# Patient Record
Sex: Male | Born: 1975 | Race: Black or African American | Hispanic: No | Marital: Single | Smoking: Never smoker
Health system: Southern US, Community
[De-identification: ages and names within clinical notes are randomized; demographics above are authoritative.]

## PROBLEM LIST (undated history)

## (undated) DIAGNOSIS — J189 Pneumonia, unspecified organism: Secondary | ICD-10-CM

## (undated) DIAGNOSIS — I10 Essential (primary) hypertension: Secondary | ICD-10-CM

## (undated) DIAGNOSIS — G4700 Insomnia, unspecified: Secondary | ICD-10-CM

## (undated) DIAGNOSIS — K219 Gastro-esophageal reflux disease without esophagitis: Secondary | ICD-10-CM

## (undated) DIAGNOSIS — Z789 Other specified health status: Secondary | ICD-10-CM

## (undated) HISTORY — DX: Essential (primary) hypertension: I10

## (undated) HISTORY — PX: FOOT SURGERY: SHX648

## (undated) HISTORY — DX: Gastro-esophageal reflux disease without esophagitis: K21.9

## (undated) HISTORY — DX: Insomnia, unspecified: G47.00

---
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
## 2016-07-28 ENCOUNTER — Emergency Department (HOSPITAL_COMMUNITY)
Admission: EM | Admit: 2016-07-28 | Discharge: 2016-07-28 | Disposition: A | Discharge status: Home / Self Care | Payer: Self-pay | Attending: Emergency Medicine | Admitting: Emergency Medicine

## 2016-07-28 ENCOUNTER — Emergency Department (HOSPITAL_COMMUNITY): Payer: Self-pay

## 2016-07-28 ENCOUNTER — Encounter (HOSPITAL_COMMUNITY): Payer: Self-pay | Admitting: Emergency Medicine

## 2016-07-28 DIAGNOSIS — Y999 Unspecified external cause status: Secondary | ICD-10-CM | POA: Insufficient documentation

## 2016-07-28 DIAGNOSIS — S62646A Nondisplaced fracture of proximal phalanx of right little finger, initial encounter for closed fracture: Secondary | ICD-10-CM | POA: Insufficient documentation

## 2016-07-28 DIAGNOSIS — S62316A Displaced fracture of base of fifth metacarpal bone, right hand, initial encounter for closed fracture: Secondary | ICD-10-CM | POA: Insufficient documentation

## 2016-07-28 DIAGNOSIS — W009XXA Unspecified fall due to ice and snow, initial encounter: Secondary | ICD-10-CM | POA: Insufficient documentation

## 2016-07-28 DIAGNOSIS — F17200 Nicotine dependence, unspecified, uncomplicated: Secondary | ICD-10-CM | POA: Insufficient documentation

## 2016-07-28 DIAGNOSIS — Y9289 Other specified places as the place of occurrence of the external cause: Secondary | ICD-10-CM | POA: Insufficient documentation

## 2016-07-28 DIAGNOSIS — Y9389 Activity, other specified: Secondary | ICD-10-CM | POA: Insufficient documentation

## 2016-07-28 MED ORDER — CEPHALEXIN 250 MG PO CAPS
500.0000 mg | ORAL_CAPSULE | Freq: Once | ORAL | Status: AC
  Administered 2016-07-28: 500 mg via ORAL
  Filled 2016-07-28: qty 2

## 2016-07-28 MED ORDER — HYDROCODONE-ACETAMINOPHEN 5-325 MG PO TABS
1.0000 | ORAL_TABLET | Freq: Once | ORAL | Status: AC
  Administered 2016-07-28: 1 via ORAL
  Filled 2016-07-28: qty 1

## 2016-07-28 MED ORDER — HYDROCODONE-ACETAMINOPHEN 5-325 MG PO TABS: 1.0000 | ORAL_TABLET | ORAL | 0 refills | Status: DC | PRN

## 2016-07-28 NOTE — Progress Notes (Signed)
Orthopedic Tech Progress Note Patient Details:  Keith ChalkCasaun Alvarado 1975-11-18 [id number]  Ortho Devices Type of Ortho Device: Ace wrap, Ulna gutter splint Ortho Device/Splint Location: RUE Ortho Device/Splint Interventions: Ordered, Application   Jennye MoccasinHughes, Keziah Drotar Craig 07/28/2016, 8:44 PM

## 2016-07-28 NOTE — ED Notes (Signed)
Orth tech notified to apply splint.

## 2016-07-28 NOTE — ED Notes (Signed)
Patient transported to X-ray 
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
## 2019-08-20 HISTORY — PX: JOINT REPLACEMENT: SHX530

## 2020-01-17 ENCOUNTER — Other Ambulatory Visit: Payer: Self-pay

## 2020-01-17 ENCOUNTER — Ambulatory Visit (INDEPENDENT_AMBULATORY_CARE_PROVIDER_SITE_OTHER): Payer: Medicaid Other

## 2020-01-17 ENCOUNTER — Ambulatory Visit (HOSPITAL_COMMUNITY)
Admission: EM | Admit: 2020-01-17 | Discharge: 2020-01-17 | Disposition: A | Discharge status: Home / Self Care | Payer: Medicaid Other | Attending: Family Medicine | Admitting: Family Medicine

## 2020-01-17 ENCOUNTER — Encounter (HOSPITAL_COMMUNITY): Payer: Self-pay

## 2020-01-17 DIAGNOSIS — M25551 Pain in right hip: Secondary | ICD-10-CM

## 2020-01-17 DIAGNOSIS — R52 Pain, unspecified: Secondary | ICD-10-CM

## 2020-01-17 MED ORDER — IBUPROFEN 800 MG PO TABS
800.0000 mg | ORAL_TABLET | Freq: Once | ORAL | Status: AC
  Administered 2020-01-17: 800 mg via ORAL

## 2020-01-17 MED ORDER — HYDROCODONE-ACETAMINOPHEN 7.5-325 MG PO TABS: 1.0000 | ORAL_TABLET | Freq: Four times a day (QID) | ORAL | 0 refills | Status: DC | PRN

## 2020-01-17 MED ORDER — METHYLPREDNISOLONE 4 MG PO TBPK: ORAL_TABLET | ORAL | 0 refills | Status: DC

## 2020-01-17 MED ORDER — KETOROLAC TROMETHAMINE 60 MG/2ML IM SOLN
INTRAMUSCULAR | Status: AC
  Filled 2020-01-17: qty 2

## 2020-01-17 MED ORDER — IBUPROFEN 800 MG PO TABS
ORAL_TABLET | ORAL | Status: AC
  Filled 2020-01-17: qty 1

## 2020-01-17 MED ORDER — KETOROLAC TROMETHAMINE 60 MG/2ML IM SOLN: 60.0000 mg | Freq: Once | INTRAMUSCULAR | Status: DC
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
## 2020-01-17 NOTE — Discharge Instructions (Signed)
Take the medrol as directed Take all of day one today Take the pain medicine as needed Activity as tolerated Return if not seeing improvement in a few days May need sports medicine specialist  if fails to improve
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
## 2020-06-27 ENCOUNTER — Other Ambulatory Visit: Payer: Self-pay | Admitting: Orthopedic Surgery

## 2020-07-04 NOTE — Patient Instructions (Addendum)
DUE TO COVID-19 ONLY ONE VISITOR IS ALLOWED TO COME WITH YOU AND STAY IN THE WAITING ROOM ONLY DURING PRE OP AND PROCEDURE DAY OF SURGERY. THE 1 VISITOR  MAY VISIT WITH YOU AFTER SURGERY IN YOUR PRIVATE ROOM DURING VISITING HOURS ONLY!  YOU NEED TO HAVE A COVID 19 TEST ON: 07/13/20 @ 11:30 AM , THIS TEST MUST BE DONE BEFORE SURGERY,  COVID TESTING SITE [address] [place] [address], IT IS ON THE RIGHT GOING OUT [address] APPROXIMATELY  2 MINUTES PAST [employer] ON THE RIGHT. ONCE YOUR COVID TEST IS COMPLETED,  PLEASE BEGIN THE QUARANTINE INSTRUCTIONS AS OUTLINED IN YOUR HANDOUT.                Keith Alvarado    Your procedure is scheduled on: 07/17/20   Report to [facility] Main  Entrance   Report to short stay at: 5:30 AM     Call this number if you have problems the morning of surgery [phone]    Remember:   NO SOLID FOOD AFTER MIDNIGHT THE NIGHT PRIOR TO SURGERY. NOTHING BY MOUTH EXCEPT CLEAR LIQUIDS UNTIL: 4:15 AM . PLEASE FINISH ENSURE DRINK PER SURGEON ORDER  WHICH NEEDS TO BE COMPLETED AT: 4:15 AM .  CLEAR LIQUID DIET   Foods Allowed                                                                     Foods Excluded  Coffee and tea, regular and decaf                             liquids that you cannot  Plain Jell-O any favor except red or purple                                           see through such as: Fruit ices (not with fruit pulp)                                     milk, soups, orange juice  Iced Popsicles                                    All solid food Carbonated beverages, regular and diet                                    Cranberry, grape and apple juices Sports drinks like Gatorade Lightly seasoned clear broth or consume(fat free) Sugar, honey syrup  Sample Menu Breakfast                                Lunch  Supper Cranberry juice                    Beef broth                             Chicken broth Jell-O                                     Grape juice                           Apple juice Coffee or tea                        Jell-O                                      Popsicle                                                Coffee or tea                        Coffee or tea  _____________________________________________________________________  BRUSH YOUR TEETH MORNING OF SURGERY AND RINSE YOUR MOUTH OUT, NO CHEWING GUM CANDY OR MINTS.               You may not have any metal on your body including hair pins and              piercings  Do not wear jewelry, lotions, powders or perfumes, deodorant             Men may shave face and neck.   Do not bring valuables to the hospital. [facility] IS NOT             RESPONSIBLE   FOR VALUABLES.  Contacts, dentures or bridgework may not be worn into surgery.  Leave suitcase in the car. After surgery it may be brought to your room.     Patients discharged the day of surgery will not be allowed to drive home. IF YOU ARE HAVING SURGERY AND GOING HOME THE SAME DAY, YOU MUST HAVE AN ADULT TO DRIVE YOU HOME AND BE WITH YOU FOR 24 HOURS. YOU MAY GO HOME BY TAXI OR UBER OR ORTHERWISE, BUT AN ADULT MUST ACCOMPANY YOU HOME AND STAY WITH YOU FOR 24 HOURS.  Name and phone number of your driver:  Special Instructions: N/A              Please read over the following fact sheets you were given: _____________________________________________________________________        [facility] - Preparing [facility] Before surgery, you can play an important role.  Because skin is not sterile, your skin needs to be as free of germs as possible.  You can reduce the number of germs on your skin by washing with CHG (chlorahexidine gluconate) soap before surgery.  CHG is an antiseptic cleaner which kills germs and bonds with the skin to continue killing germs even after washing. Please DO NOT use if you have an allergy to  CHG or antibacterial soaps.   If your skin becomes reddened/irritated stop using the CHG and inform your nurse when you arrive at Short Stay. Do not shave (including legs and underarms) for at least 48 hours prior to the first CHG shower.  You may shave your face/neck. Please follow these instructions carefully:  1.  Shower with CHG Soap the night before surgery and the  morning of Surgery.  2.  If you choose to wash your hair, wash your hair first as usual with your  normal  shampoo.  3.  After you shampoo, rinse your hair and body thoroughly to remove the  shampoo.                           4.  Use CHG as you would any other liquid soap.  You can apply chg directly  to the skin and wash                       Gently with a scrungie or clean washcloth.  5.  Apply the CHG Soap to your body ONLY FROM THE NECK DOWN.   Do not use on face/ open                           Wound or open sores. Avoid contact with eyes, ears mouth and genitals (private parts).                       Wash face,  Genitals (private parts) with your normal soap.             6.  Wash thoroughly, paying special attention to the area where your surgery  will be performed.  7.  Thoroughly rinse your body with warm water from the neck down.  8.  DO NOT shower/wash with your normal soap after using and rinsing off  the CHG Soap.                9.  Pat yourself dry with a clean towel.            10.  Wear clean pajamas.            11.  Place clean sheets on your bed the night of your first shower and do not  sleep with pets. Day of Surgery : Do not apply any lotions/deodorants the morning of surgery.  Please wear clean clothes to the hospital/surgery center.  FAILURE TO FOLLOW THESE INSTRUCTIONS MAY RESULT IN THE CANCELLATION OF YOUR SURGERY PATIENT SIGNATURE_________________________________  NURSE SIGNATURE__________________________________  ________________________________________________________________________   Keith Alvarado  An incentive  spirometer is a tool that can help keep your lungs clear and active. This tool measures how well you are filling your lungs with each breath. Taking long deep breaths may help reverse or decrease the chance of developing breathing (pulmonary) problems (especially infection) following:  A long period of time when you are unable to move or be active. BEFORE THE PROCEDURE   If the spirometer includes an indicator to show your best effort, your nurse or respiratory therapist will set it to a desired goal.  If possible, sit up straight or lean slightly forward. Try not to slouch.  Hold the incentive spirometer in an upright position. INSTRUCTIONS FOR USE  1. Sit on the edge of your bed if possible, or sit up as far as you  can in bed or on a chair. 2. Hold the incentive spirometer in an upright position. 3. Breathe out normally. 4. Place the mouthpiece in your mouth and seal your lips tightly around it. 5. Breathe in slowly and as deeply as possible, raising the piston or the ball toward the top of the column. 6. Hold your breath for 3-5 seconds or for as long as possible. Allow the piston or ball to fall to the bottom of the column. 7. Remove the mouthpiece from your mouth and breathe out normally. 8. Rest for a few seconds and repeat Steps 1 through 7 at least 10 times every 1-2 hours when you are awake. Take your time and take a few normal breaths between deep breaths. 9. The spirometer may include an indicator to show your best effort. Use the indicator as a goal to work toward during each repetition. 10. After each set of 10 deep breaths, practice coughing to be sure your lungs are clear. If you have an incision (the cut made at the time of surgery), support your incision when coughing by placing a pillow or rolled up towels firmly against it. Once you are able to get out of bed, walk around indoors and cough well. You may stop using the incentive spirometer when instructed by your caregiver.   RISKS AND COMPLICATIONS  Take your time so you do not get dizzy or light-headed.  If you are in pain, you may need to take or ask for pain medication before doing incentive spirometry. It is harder to take a deep breath if you are having pain. AFTER USE  Rest and breathe slowly and easily.  It can be helpful to keep track of a log of your progress. Your caregiver can provide you with a simple table to help with this. If you are using the spirometer at home, follow these instructions: SEEK MEDICAL CARE IF:   You are having difficultly using the spirometer.  You have trouble using the spirometer as often as instructed.  Your pain medication is not giving enough relief while using the spirometer.  You develop fever of 100.5 F (38.1 C) or higher. SEEK IMMEDIATE MEDICAL CARE IF:   You cough up bloody sputum that had not been present before.  You develop fever of 102 F (38.9 C) or greater.  You develop worsening pain at or near the incision site. MAKE SURE YOU:   Understand these instructions.  Will watch your condition.  Will get help right away if you are not doing well or get worse. Document Released: 12/16/2006 Document Revised: 10/28/2011 Document Reviewed: 02/16/2007 [facility] Patient Information 2014 [place], [place].   ________________________________________________________________________

## 2020-07-05 ENCOUNTER — Encounter (HOSPITAL_COMMUNITY): Payer: Self-pay

## 2020-07-05 ENCOUNTER — Encounter (HOSPITAL_COMMUNITY)
Admission: RE | Admit: 2020-07-05 | Discharge: 2020-07-05 | Disposition: A | Discharge status: Home / Self Care | Payer: Medicaid Other | Source: Ambulatory Visit | Attending: Orthopedic Surgery | Admitting: Orthopedic Surgery

## 2020-07-05 ENCOUNTER — Ambulatory Visit (HOSPITAL_COMMUNITY)
Admission: RE | Admit: 2020-07-05 | Discharge: 2020-07-05 | Disposition: A | Discharge status: Home / Self Care | Payer: Medicaid Other | Source: Ambulatory Visit | Attending: Orthopedic Surgery | Admitting: Orthopedic Surgery

## 2020-07-05 ENCOUNTER — Other Ambulatory Visit: Payer: Self-pay

## 2020-07-05 DIAGNOSIS — Z01818 Encounter for other preprocedural examination: Secondary | ICD-10-CM

## 2020-07-05 LAB — URINALYSIS, ROUTINE W REFLEX MICROSCOPIC
Bacteria, UA: NONE SEEN
Bilirubin Urine: NEGATIVE
Glucose, UA: NEGATIVE mg/dL
Hgb urine dipstick: NEGATIVE
Ketones, ur: 5 mg/dL — AB
Nitrite: NEGATIVE
Protein, ur: 30 mg/dL — AB
Specific Gravity, Urine: 1.024 (ref 1.005–1.030)
pH: 5 (ref 5.0–8.0)

## 2020-07-05 LAB — BASIC METABOLIC PANEL
Anion gap: 11 (ref 5–15)
BUN: 6 mg/dL (ref 6–20)
CO2: 28 mmol/L (ref 22–32)
Calcium: 9.8 mg/dL (ref 8.9–10.3)
Chloride: 97 mmol/L — ABNORMAL LOW (ref 98–111)
Creatinine, Ser: 0.75 mg/dL (ref 0.61–1.24)
GFR, Estimated: >60 mL/min (ref >60)
Glucose, Bld: 82 mg/dL (ref 70–99)
Potassium: 4.5 mmol/L (ref 3.5–5.1)
Sodium: 136 mmol/L (ref 135–145)

## 2020-07-05 LAB — PROTIME-INR
INR: 0.9 (ref 0.8–1.2)
Prothrombin Time: 12.1 seconds (ref 11.4–15.2)

## 2020-07-05 LAB — TYPE AND SCREEN
ABO/RH(D): A POS
Antibody Screen: NEGATIVE

## 2020-07-05 LAB — CBC WITH DIFFERENTIAL/PLATELET
Abs Immature Granulocytes: 0.08 10*3/uL — ABNORMAL HIGH (ref 0.00–0.07)
Basophils Absolute: 0.1 10*3/uL (ref 0.0–0.1)
Basophils Relative: 1 %
Eosinophils Absolute: 0.2 10*3/uL (ref 0.0–0.5)
Eosinophils Relative: 2 %
HCT: 44.1 % (ref 39.0–52.0)
Hemoglobin: 14.2 g/dL (ref 13.0–17.0)
Immature Granulocytes: 1 %
Lymphocytes Relative: 23 %
Lymphs Abs: 1.8 10*3/uL (ref 0.7–4.0)
MCH: 30.9 pg (ref 26.0–34.0)
MCHC: 32.2 g/dL (ref 30.0–36.0)
MCV: 96.1 fL (ref 80.0–100.0)
Monocytes Absolute: 1.5 10*3/uL — ABNORMAL HIGH (ref 0.1–1.0)
Monocytes Relative: 19 %
Neutro Abs: 4.3 10*3/uL (ref 1.7–7.7)
Neutrophils Relative %: 54 %
Platelets: 432 10*3/uL — ABNORMAL HIGH (ref 150–400)
RBC: 4.59 MIL/uL (ref 4.22–5.81)
RDW: 13.2 % (ref 11.5–15.5)
WBC: 7.9 10*3/uL (ref 4.0–10.5)
nRBC: 0 % (ref 0.0–0.2)

## 2020-07-05 LAB — SURGICAL PCR SCREEN
MRSA, PCR: NEGATIVE
Staphylococcus aureus: POSITIVE — AB

## 2020-07-05 LAB — APTT: aPTT: 32 seconds (ref 24–36)

## 2020-07-05 NOTE — Progress Notes (Signed)
PCR: Positive: STAPH. UA: Trace leukocytes

## 2020-07-05 NOTE — Progress Notes (Signed)
COVID Vaccine Completed: NO Date COVID Vaccine completed: COVID vaccine manufacturer: Pfizer    Moderna   [employer]   PCP - NO PCP Cardiologist -   Chest x-ray -  EKG -  Stress Test -  ECHO -  Cardiac Cath -  Pacemaker/ICD device last checked:  Sleep Study -  CPAP -   Fasting Blood Sugar -  Checks Blood Sugar _____ times a day  Blood Thinner Instructions: Aspirin Instructions: Last Dose:  Anesthesia review:   Patient denies shortness of breath, fever, cough and chest pain at PAT appointment   Patient verbalized understanding of instructions that were given to them at the PAT appointment. Patient was also instructed that they will need to review over the PAT instructions again at home before surgery. 

## 2020-07-07 DIAGNOSIS — M87059 Idiopathic aseptic necrosis of unspecified femur: Secondary | ICD-10-CM | POA: Diagnosis present

## 2020-07-07 NOTE — H&P (Signed)
TOTAL HIP ADMISSION H&P  Patient is admitted for right total hip arthroplasty.  Subjective:  Chief Complaint: right hip pain  HPI: Keith Alvarado, 44 y.o. 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  Onset of symptoms was abrupt starting 1 years ago with rapidlly worsening course since that time.The patient noted no past surgery on the right hip(s).  Patient currently rates pain in the right hip at 10 out of 10 with activity. Patient has night pain, worsening of pain with activity and weight bearing, trendelenberg gait, pain that interfers with activities of daily living and pain with passive range of motion. Patient has evidence of AVN by imaging studies. This condition presents safety issues increasing the risk of falls. This patient has had avascular necrosis of the hip, acetabular fracture, hip dysplasia.  There is no current active infection.  Patient Active Problem List   Diagnosis Date Noted  . 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 Pneumonia     Past Surgical History:  Procedure Laterality Date  . FOOT SURGERY Left     No current facility-administered medications for this encounter.   Current Outpatient Medications  Medication Sig Dispense Refill Last Dose  . Multiple Vitamin (MULTIVITAMIN WITH MINERALS) TABS tablet Take 1 tablet by mouth daily. Centrum     . OVER THE COUNTER MEDICATION Take 1 capsule by mouth in the morning and at bedtime. [employer]     . traMADol (ULTRAM) 50 MG tablet Take 50 mg by mouth 4 (four) times daily as needed.      . trolamine salicylate (ASPERCREME) 10 % cream Apply 1 application topically 4 (four) times daily as needed for muscle  pain.     Marland Kitchen HYDROcodone-acetaminophen (NORCO) 7.5-325 MG tablet Take 1 tablet by mouth every 6 (six) hours as needed for moderate pain. (Patient not taking: Reported on 06/30/2020) 15 tablet 0 Not Taking at Unknown time  . methylPREDNISolone (MEDROL DOSEPAK) 4 MG TBPK tablet TAD (Patient not taking: Reported on 06/30/2020) 21 tablet 0 Not Taking at Unknown time   No Known Allergies  Social History   Tobacco Use  . Smoking status: Never Smoker  . Smokeless tobacco: Never Used  Substance Use Topics  . Alcohol use: Yes    Alcohol/week: 8.0 standard drinks    Types: 8 Cans of beer per week    Comment: every other day    Family History  Problem Relation Age of Onset  . Healthy Mother   . Healthy Father      Review of Systems  Constitutional: Positive for chills, diaphoresis and fever.  HENT: Negative.   Eyes: Negative.   Respiratory: Negative.   Cardiovascular: Negative.   Gastrointestinal: Negative.   Endocrine: Negative.   Genitourinary: Negative.   Musculoskeletal: Positive for arthralgias and myalgias.  Skin: Negative.   Allergic/Immunologic: Negative.   Neurological: Negative.   Hematological: Negative.   Psychiatric/Behavioral: Positive for sleep disturbance.    Objective:  Physical Exam Constitutional:      Appearance: Normal appearance. He is normal weight.  HENT:     Head: Normocephalic and atraumatic.     Nose: Nose normal.  Eyes:     Pupils: Pupils are equal, round, and reactive to light.  Cardiovascular:     Pulses: Normal pulses.  Pulmonary:     Effort: Pulmonary effort is normal.  Musculoskeletal:     Cervical back: Normal range of motion and neck supple.     Comments: Patient walks with a profound right greater than left-sided limp has a very irritable hip to internal or external rotation neurovascular intact distally full range of motion of the knee and ankle toes are pink and well perfused.  Skin:    General: Skin is warm and dry.  Neurological:      General: No focal deficit present.     Mental Status: He is alert and oriented to person, place, and time. Mental status is at baseline.  Psychiatric:        Mood and Affect: Mood normal.        Behavior: Behavior normal.        Thought Content: Thought content normal.        Judgment: Judgment normal.     Vital signs in last 24 hours:    Labs:   Estimated body mass index is 21.7 kg/m as calculated from the following:   Height as of 07/05/20: 6' (1.829 m).   Weight as of 07/05/20: 72.6 kg.   Imaging Review Plain radiographs demonstrate AP pelvis and frog lateral of the right hip, I reviewed on the can if he PACS system.  X-rays show what appears to be an impaction of the femoral head laterally 1-2 mm only seen on the frog lateral view not the AP view.    Assessment/Plan:  End stage arthritis, right hip(s)  The patient history, physical examination, clinical judgement of the provider and imaging studies are consistent with end stage degenerative joint disease of the right hip(s) and total hip arthroplasty is deemed medically necessary. The treatment options including medical management, injection therapy, arthroscopy and arthroplasty were discussed at length. The risks and benefits of total hip arthroplasty were presented and reviewed. The risks due to aseptic loosening, infection, stiffness, dislocation/subluxation,  thromboembolic complications and other imponderables were discussed.  The patient acknowledged the explanation, agreed to proceed with the plan and consent was signed. Patient is being admitted for inpatient treatment for surgery, pain control, PT, OT, prophylactic antibiotics, VTE prophylaxis, progressive ambulation and ADL's and discharge planning.The patient is planning to be discharged home with home health services    Patient's anticipated LOS is less than 2 midnights, meeting these requirements: - Younger than 49 - Lives within 1 hour of care - Has a  competent adult at home to recover with post-op recover - NO history of  - Chronic pain requiring opiods  - Diabetes  - Coronary Artery Disease  - Heart failure  - Heart attack  - Stroke  - DVT/VTE  - Cardiac arrhythmia  - Respiratory Failure/COPD  - Renal failure  - Anemia  - Advanced Liver disease

## 2020-07-14 ENCOUNTER — Other Ambulatory Visit (HOSPITAL_COMMUNITY)
Admission: RE | Admit: 2020-07-14 | Discharge: 2020-07-14 | Disposition: A | Discharge status: Home / Self Care | Payer: Medicaid Other | Source: Ambulatory Visit | Attending: Orthopedic Surgery | Admitting: Orthopedic Surgery

## 2020-07-14 DIAGNOSIS — Z20822 Contact with and (suspected) exposure to covid-19: Secondary | ICD-10-CM | POA: Diagnosis not present

## 2020-07-14 DIAGNOSIS — Z01812 Encounter for preprocedural laboratory examination: Secondary | ICD-10-CM | POA: Diagnosis present

## 2020-07-14 LAB — SARS CORONAVIRUS 2 (TAT 6-24 HRS): SARS Coronavirus 2: NEGATIVE

## 2020-07-16 MED ORDER — TRANEXAMIC ACID 1000 MG/10ML IV SOLN
2000.0000 mg | INTRAVENOUS | Status: DC
  Filled 2020-07-16: qty 20

## 2020-07-16 MED ORDER — BUPIVACAINE LIPOSOME 1.3 % IJ SUSP
10.0000 mL | Freq: Once | INTRAMUSCULAR | Status: DC
  Filled 2020-07-16: qty 10

## 2020-07-17 ENCOUNTER — Observation Stay (HOSPITAL_COMMUNITY)
Admission: RE | Admit: 2020-07-17 | Discharge: 2020-07-18 | Disposition: A | Discharge status: Home / Self Care | Payer: Medicaid Other | Attending: Orthopedic Surgery | Admitting: Orthopedic Surgery

## 2020-07-17 ENCOUNTER — Ambulatory Visit (HOSPITAL_COMMUNITY): Payer: Medicaid Other | Admitting: Certified Registered"

## 2020-07-17 ENCOUNTER — Encounter (HOSPITAL_COMMUNITY)
Admission: RE | Disposition: A | Discharge status: Home / Self Care | Payer: Self-pay | Source: Home / Self Care | Attending: Orthopedic Surgery

## 2020-07-17 ENCOUNTER — Other Ambulatory Visit: Payer: Self-pay

## 2020-07-17 ENCOUNTER — Ambulatory Visit (HOSPITAL_COMMUNITY): Payer: Medicaid Other

## 2020-07-17 ENCOUNTER — Encounter (HOSPITAL_COMMUNITY): Payer: Self-pay | Admitting: Orthopedic Surgery

## 2020-07-17 DIAGNOSIS — M87051 Idiopathic aseptic necrosis of right femur: Secondary | ICD-10-CM | POA: Insufficient documentation

## 2020-07-17 DIAGNOSIS — Z419 Encounter for procedure for purposes other than remedying health state, unspecified: Secondary | ICD-10-CM

## 2020-07-17 DIAGNOSIS — M1611 Unilateral primary osteoarthritis, right hip: Principal | ICD-10-CM | POA: Insufficient documentation

## 2020-07-17 DIAGNOSIS — M87059 Idiopathic aseptic necrosis of unspecified femur: Secondary | ICD-10-CM | POA: Diagnosis present

## 2020-07-17 DIAGNOSIS — M25551 Pain in right hip: Secondary | ICD-10-CM | POA: Diagnosis present

## 2020-07-17 HISTORY — PX: TOTAL HIP ARTHROPLASTY: SHX124

## 2020-07-17 LAB — ABO/RH: ABO/RH(D): A POS

## 2020-07-17 SURGERY — ARTHROPLASTY, HIP, TOTAL, ANTERIOR APPROACH
Anesthesia: Spinal | Site: Hip | Laterality: Right

## 2020-07-17 MED ORDER — LIDOCAINE 2% (20 MG/ML) 5 ML SYRINGE
INTRAMUSCULAR | Status: DC | PRN
  Administered 2020-07-17: 60 mg via INTRAVENOUS
  Administered 2020-07-17: 40 mg via INTRAVENOUS

## 2020-07-17 MED ORDER — DEXMEDETOMIDINE (PRECEDEX) IN NS 20 MCG/5ML (4 MCG/ML) IV SYRINGE
PREFILLED_SYRINGE | INTRAVENOUS | Status: AC
  Filled 2020-07-17: qty 10

## 2020-07-17 MED ORDER — ORAL CARE MOUTH RINSE: 15.0000 mL | Freq: Once | OROMUCOSAL | Status: AC

## 2020-07-17 MED ORDER — ONDANSETRON HCL 4 MG/2ML IJ SOLN
INTRAMUSCULAR | Status: AC
  Filled 2020-07-17: qty 2

## 2020-07-17 MED ORDER — BUPIVACAINE-EPINEPHRINE (PF) 0.25% -1:200000 IJ SOLN
INTRAMUSCULAR | Status: AC
  Filled 2020-07-17: qty 30

## 2020-07-17 MED ORDER — OXYCODONE-ACETAMINOPHEN 5-325 MG PO TABS
1.0000 | ORAL_TABLET | ORAL | 0 refills | Status: DC | PRN
Start: 2020-07-17 — End: 2020-12-12

## 2020-07-17 MED ORDER — PROPOFOL 500 MG/50ML IV EMUL
INTRAVENOUS | Status: DC | PRN
  Administered 2020-07-17: 100 ug/kg/min via INTRAVENOUS

## 2020-07-17 MED ORDER — LACTATED RINGERS IV BOLUS
250.0000 mL | Freq: Once | INTRAVENOUS | Status: AC
  Administered 2020-07-17: 250 mL via INTRAVENOUS

## 2020-07-17 MED ORDER — METHOCARBAMOL 500 MG IVPB - SIMPLE MED
500.0000 mg | Freq: Four times a day (QID) | INTRAVENOUS | Status: DC | PRN
  Filled 2020-07-17: qty 50

## 2020-07-17 MED ORDER — DOCUSATE SODIUM 100 MG PO CAPS
100.0000 mg | ORAL_CAPSULE | Freq: Two times a day (BID) | ORAL | Status: DC
  Administered 2020-07-17 – 2020-07-18 (×2): 100 mg via ORAL
  Filled 2020-07-17 (×2): qty 1

## 2020-07-17 MED ORDER — ALUM & MAG HYDROXIDE-SIMETH 200-200-20 MG/5ML PO SUSP: 30.0000 mL | ORAL | Status: DC | PRN

## 2020-07-17 MED ORDER — PHENOL 1.4 % MT LIQD: 1.0000 | OROMUCOSAL | Status: DC | PRN

## 2020-07-17 MED ORDER — DEXMEDETOMIDINE (PRECEDEX) IN NS 20 MCG/5ML (4 MCG/ML) IV SYRINGE
PREFILLED_SYRINGE | INTRAVENOUS | Status: DC | PRN
  Administered 2020-07-17: 5 ug via INTRAVENOUS
  Administered 2020-07-17: 8 ug via INTRAVENOUS
  Administered 2020-07-17: 4 ug via INTRAVENOUS
  Administered 2020-07-17 (×2): 8 ug via INTRAVENOUS

## 2020-07-17 MED ORDER — MIDAZOLAM HCL 2 MG/2ML IJ SOLN
INTRAMUSCULAR | Status: AC
  Filled 2020-07-17: qty 2

## 2020-07-17 MED ORDER — FLEET ENEMA 7-19 GM/118ML RE ENEM: 1.0000 | ENEMA | Freq: Once | RECTAL | Status: DC | PRN

## 2020-07-17 MED ORDER — KCL IN DEXTROSE-NACL 20-5-0.45 MEQ/L-%-% IV SOLN
INTRAVENOUS | Status: DC
  Filled 2020-07-17 (×3): qty 1000

## 2020-07-17 MED ORDER — ACETAMINOPHEN 500 MG PO TABS
1000.0000 mg | ORAL_TABLET | Freq: Four times a day (QID) | ORAL | Status: DC
  Administered 2020-07-17 – 2020-07-18 (×2): 1000 mg via ORAL
  Filled 2020-07-17 (×3): qty 2

## 2020-07-17 MED ORDER — LIDOCAINE HCL (PF) 2 % IJ SOLN
INTRAMUSCULAR | Status: AC
  Filled 2020-07-17: qty 10

## 2020-07-17 MED ORDER — DEXAMETHASONE SODIUM PHOSPHATE 10 MG/ML IJ SOLN
INTRAMUSCULAR | Status: AC
  Filled 2020-07-17: qty 1

## 2020-07-17 MED ORDER — METOCLOPRAMIDE HCL 5 MG/ML IJ SOLN: 5.0000 mg | Freq: Three times a day (TID) | INTRAMUSCULAR | Status: DC | PRN

## 2020-07-17 MED ORDER — PANTOPRAZOLE SODIUM 40 MG PO TBEC
40.0000 mg | DELAYED_RELEASE_TABLET | Freq: Every day | ORAL | Status: DC
  Administered 2020-07-17 – 2020-07-18 (×2): 40 mg via ORAL
  Filled 2020-07-17 (×2): qty 1

## 2020-07-17 MED ORDER — PHENYLEPHRINE HCL (PRESSORS) 10 MG/ML IV SOLN
INTRAVENOUS | Status: AC
  Filled 2020-07-17: qty 1

## 2020-07-17 MED ORDER — LACTATED RINGERS IV SOLN: INTRAVENOUS | Status: DC

## 2020-07-17 MED ORDER — CEFAZOLIN SODIUM-DEXTROSE 2-4 GM/100ML-% IV SOLN
2.0000 g | INTRAVENOUS | Status: AC
  Administered 2020-07-17: 2 g via INTRAVENOUS
  Filled 2020-07-17: qty 100

## 2020-07-17 MED ORDER — CHLORHEXIDINE GLUCONATE 0.12 % MT SOLN
15.0000 mL | Freq: Once | OROMUCOSAL | Status: AC
  Administered 2020-07-17: 15 mL via OROMUCOSAL

## 2020-07-17 MED ORDER — FENTANYL CITRATE (PF) 250 MCG/5ML IJ SOLN
INTRAMUSCULAR | Status: DC | PRN
  Administered 2020-07-17 (×2): 50 ug via INTRAVENOUS
  Administered 2020-07-17: 100 ug via INTRAVENOUS
  Administered 2020-07-17 (×2): 25 ug via INTRAVENOUS

## 2020-07-17 MED ORDER — DEXAMETHASONE SODIUM PHOSPHATE 10 MG/ML IJ SOLN
10.0000 mg | Freq: Once | INTRAMUSCULAR | Status: AC
  Administered 2020-07-18: 10 mg via INTRAVENOUS
  Filled 2020-07-17: qty 1

## 2020-07-17 MED ORDER — PROPOFOL 10 MG/ML IV BOLUS
INTRAVENOUS | Status: AC
  Filled 2020-07-17: qty 20

## 2020-07-17 MED ORDER — OXYCODONE HCL 5 MG PO TABS
10.0000 mg | ORAL_TABLET | ORAL | Status: DC | PRN
  Administered 2020-07-17 (×2): 10 mg via ORAL
  Administered 2020-07-18 (×3): 15 mg via ORAL
  Filled 2020-07-17 (×3): qty 3
  Filled 2020-07-17: qty 2

## 2020-07-17 MED ORDER — BUPIVACAINE-EPINEPHRINE 0.25% -1:200000 IJ SOLN
INTRAMUSCULAR | Status: DC | PRN
  Administered 2020-07-17: 30 mL

## 2020-07-17 MED ORDER — ONDANSETRON HCL 4 MG/2ML IJ SOLN
INTRAMUSCULAR | Status: DC | PRN
  Administered 2020-07-17: 4 mg via INTRAVENOUS

## 2020-07-17 MED ORDER — SUCCINYLCHOLINE CHLORIDE 200 MG/10ML IV SOSY
PREFILLED_SYRINGE | INTRAVENOUS | Status: AC
  Filled 2020-07-17: qty 10

## 2020-07-17 MED ORDER — TRANEXAMIC ACID-NACL 1000-0.7 MG/100ML-% IV SOLN
1000.0000 mg | INTRAVENOUS | Status: AC
  Administered 2020-07-17: 1000 mg via INTRAVENOUS
  Filled 2020-07-17: qty 100

## 2020-07-17 MED ORDER — ONDANSETRON HCL 4 MG PO TABS: 4.0000 mg | ORAL_TABLET | Freq: Four times a day (QID) | ORAL | Status: DC | PRN

## 2020-07-17 MED ORDER — METOCLOPRAMIDE HCL 5 MG PO TABS: 5.0000 mg | ORAL_TABLET | Freq: Three times a day (TID) | ORAL | Status: DC | PRN

## 2020-07-17 MED ORDER — EPHEDRINE 5 MG/ML INJ
INTRAVENOUS | Status: AC
  Filled 2020-07-17: qty 10

## 2020-07-17 MED ORDER — DIPHENHYDRAMINE HCL 12.5 MG/5ML PO ELIX: 12.5000 mg | ORAL_SOLUTION | ORAL | Status: DC | PRN

## 2020-07-17 MED ORDER — BUPIVACAINE IN DEXTROSE 0.75-8.25 % IT SOLN
INTRATHECAL | Status: DC | PRN
  Administered 2020-07-17: 1.8 mL via INTRATHECAL

## 2020-07-17 MED ORDER — 0.9 % SODIUM CHLORIDE (POUR BTL) OPTIME
TOPICAL | Status: DC | PRN
  Administered 2020-07-17: 1000 mL

## 2020-07-17 MED ORDER — SODIUM CHLORIDE (PF) 0.9 % IJ SOLN
INTRAMUSCULAR | Status: AC
  Filled 2020-07-17: qty 50

## 2020-07-17 MED ORDER — FENTANYL CITRATE (PF) 250 MCG/5ML IJ SOLN
INTRAMUSCULAR | Status: AC
  Filled 2020-07-17: qty 5

## 2020-07-17 MED ORDER — LACTATED RINGERS IV BOLUS
500.0000 mL | Freq: Once | INTRAVENOUS | Status: AC
  Administered 2020-07-17: 500 mL via INTRAVENOUS

## 2020-07-17 MED ORDER — TRANEXAMIC ACID 1000 MG/10ML IV SOLN
INTRAVENOUS | Status: DC | PRN
  Administered 2020-07-17: 2000 mg via TOPICAL

## 2020-07-17 MED ORDER — MENTHOL 3 MG MT LOZG: 1.0000 | LOZENGE | OROMUCOSAL | Status: DC | PRN

## 2020-07-17 MED ORDER — ACETAMINOPHEN 325 MG PO TABS: 325.0000 mg | ORAL_TABLET | Freq: Four times a day (QID) | ORAL | Status: DC | PRN

## 2020-07-17 MED ORDER — POVIDONE-IODINE 10 % EX SWAB
2.0000 "application " | Freq: Once | CUTANEOUS | Status: AC
  Administered 2020-07-17: 2 via TOPICAL

## 2020-07-17 MED ORDER — SODIUM CHLORIDE 0.9% FLUSH
INTRAVENOUS | Status: DC | PRN
  Administered 2020-07-17: 50 mL

## 2020-07-17 MED ORDER — METHOCARBAMOL 500 MG IVPB - SIMPLE MED
INTRAVENOUS | Status: AC
  Administered 2020-07-17: 500 mg via INTRAVENOUS
  Filled 2020-07-17: qty 50

## 2020-07-17 MED ORDER — PROPOFOL 10 MG/ML IV BOLUS
INTRAVENOUS | Status: DC | PRN
  Administered 2020-07-17: 20 mg via INTRAVENOUS
  Administered 2020-07-17 (×2): 50 mg via INTRAVENOUS
  Administered 2020-07-17 (×2): 20 mg via INTRAVENOUS
  Administered 2020-07-17: 50 mg via INTRAVENOUS

## 2020-07-17 MED ORDER — BUPIVACAINE LIPOSOME 1.3 % IJ SUSP
INTRAMUSCULAR | Status: DC | PRN
  Administered 2020-07-17: 10 mL

## 2020-07-17 MED ORDER — ASPIRIN EC 81 MG PO TBEC: 81.0000 mg | DELAYED_RELEASE_TABLET | Freq: Two times a day (BID) | ORAL | 0 refills | Status: DC

## 2020-07-17 MED ORDER — HYDROMORPHONE HCL 1 MG/ML IJ SOLN
INTRAMUSCULAR | Status: AC
  Administered 2020-07-17: 0.5 mg via INTRAVENOUS
  Filled 2020-07-17: qty 1

## 2020-07-17 MED ORDER — STERILE WATER FOR IRRIGATION IR SOLN
Status: DC | PRN
  Administered 2020-07-17: 2000 mL

## 2020-07-17 MED ORDER — BISACODYL 5 MG PO TBEC: 5.0000 mg | DELAYED_RELEASE_TABLET | Freq: Every day | ORAL | Status: DC | PRN

## 2020-07-17 MED ORDER — METHOCARBAMOL 500 MG PO TABS
500.0000 mg | ORAL_TABLET | Freq: Four times a day (QID) | ORAL | Status: DC | PRN
  Administered 2020-07-17 – 2020-07-18 (×3): 500 mg via ORAL
  Filled 2020-07-17 (×3): qty 1

## 2020-07-17 MED ORDER — PROPOFOL 1000 MG/100ML IV EMUL
INTRAVENOUS | Status: AC
  Filled 2020-07-17: qty 100

## 2020-07-17 MED ORDER — ROCURONIUM BROMIDE 10 MG/ML (PF) SYRINGE
PREFILLED_SYRINGE | INTRAVENOUS | Status: AC
  Filled 2020-07-17: qty 10

## 2020-07-17 MED ORDER — TIZANIDINE HCL 2 MG PO TABS: 2.0000 mg | ORAL_TABLET | Freq: Four times a day (QID) | ORAL | 0 refills | Status: DC | PRN

## 2020-07-17 MED ORDER — POLYETHYLENE GLYCOL 3350 17 G PO PACK: 17.0000 g | PACK | Freq: Every day | ORAL | Status: DC | PRN

## 2020-07-17 MED ORDER — GABAPENTIN 300 MG PO CAPS
300.0000 mg | ORAL_CAPSULE | Freq: Three times a day (TID) | ORAL | Status: DC
  Administered 2020-07-17 – 2020-07-18 (×2): 300 mg via ORAL
  Filled 2020-07-17 (×2): qty 1

## 2020-07-17 MED ORDER — HYDROMORPHONE HCL 1 MG/ML IJ SOLN: 0.2500 mg | INTRAMUSCULAR | Status: DC | PRN

## 2020-07-17 MED ORDER — OXYCODONE HCL 5 MG PO TABS
5.0000 mg | ORAL_TABLET | ORAL | Status: DC | PRN
  Administered 2020-07-17: 10 mg via ORAL
  Filled 2020-07-17: qty 2

## 2020-07-17 MED ORDER — ASPIRIN 81 MG PO CHEW
81.0000 mg | CHEWABLE_TABLET | Freq: Two times a day (BID) | ORAL | Status: DC
  Administered 2020-07-17 – 2020-07-18 (×2): 81 mg via ORAL
  Filled 2020-07-17 (×2): qty 1

## 2020-07-17 MED ORDER — MIDAZOLAM HCL 2 MG/2ML IJ SOLN
INTRAMUSCULAR | Status: DC | PRN
  Administered 2020-07-17: 2 mg via INTRAVENOUS

## 2020-07-17 MED ORDER — HYDROMORPHONE HCL 1 MG/ML IJ SOLN
0.5000 mg | INTRAMUSCULAR | Status: DC | PRN
  Administered 2020-07-18: 0.5 mg via INTRAVENOUS
  Filled 2020-07-17: qty 1

## 2020-07-17 MED ORDER — LORAZEPAM 0.5 MG PO TABS: 0.5000 mg | ORAL_TABLET | ORAL | Status: DC | PRN

## 2020-07-17 MED ORDER — PHENYLEPHRINE 40 MCG/ML (10ML) SYRINGE FOR IV PUSH (FOR BLOOD PRESSURE SUPPORT)
PREFILLED_SYRINGE | INTRAVENOUS | Status: AC
  Filled 2020-07-17: qty 10

## 2020-07-17 MED ORDER — ONDANSETRON HCL 4 MG/2ML IJ SOLN: 4.0000 mg | Freq: Four times a day (QID) | INTRAMUSCULAR | Status: DC | PRN

## 2020-07-17 MED ORDER — DEXAMETHASONE SODIUM PHOSPHATE 10 MG/ML IJ SOLN
INTRAMUSCULAR | Status: DC | PRN
  Administered 2020-07-17: 10 mg via INTRAVENOUS

## 2020-07-17 MED ORDER — OXYCODONE HCL 5 MG PO TABS
ORAL_TABLET | ORAL | Status: AC
  Filled 2020-07-17: qty 2

## 2020-07-17 SURGICAL SUPPLY — 48 items
BAG DECANTER FOR FLEXI CONT (MISCELLANEOUS) ×3 IMPLANT
BLADE SAW SGTL 18X1.27X75 (BLADE) ×2 IMPLANT
BLADE SAW SGTL 18X1.27X75MM (BLADE) ×1
BLADE SURG SZ10 CARB STEEL (BLADE) ×6 IMPLANT
CNTNR URN SCR LID CUP LEK RST (MISCELLANEOUS) ×1 IMPLANT
CONT SPEC 4OZ STRL OR WHT (MISCELLANEOUS) ×2
COVER PERINEAL POST (MISCELLANEOUS) ×3 IMPLANT
COVER SURGICAL LIGHT HANDLE (MISCELLANEOUS) ×3 IMPLANT
COVER WAND RF STERILE (DRAPES) ×3 IMPLANT
CUP ACETBLR 52 OD 100 SERIES (Hips) ×3 IMPLANT
DECANTER SPIKE VIAL GLASS SM (MISCELLANEOUS) ×6 IMPLANT
DRAPE ORTHO SPLIT 77X108 STRL (DRAPES)
DRAPE STERI IOBAN 125X83 (DRAPES) ×3 IMPLANT
DRAPE SURG ORHT 6 SPLT 77X108 (DRAPES) IMPLANT
DRAPE U-SHAPE 47X51 STRL (DRAPES) ×6 IMPLANT
DRSG AQUACEL AG ADV 3.5X10 (GAUZE/BANDAGES/DRESSINGS) ×3 IMPLANT
DURAPREP 26ML APPLICATOR (WOUND CARE) ×3 IMPLANT
ELECT BLADE TIP CTD 4 INCH (ELECTRODE) ×3 IMPLANT
ELECT REM PT RETURN 15FT ADLT (MISCELLANEOUS) ×3 IMPLANT
ELIMINATOR HOLE APEX DEPUY (Hips) ×3 IMPLANT
GLOVE BIO SURGEON STRL SZ7.5 (GLOVE) ×3 IMPLANT
GLOVE BIO SURGEON STRL SZ8.5 (GLOVE) ×3 IMPLANT
GLOVE BIOGEL PI IND STRL 8 (GLOVE) ×1 IMPLANT
GLOVE BIOGEL PI IND STRL 9 (GLOVE) ×1 IMPLANT
GLOVE BIOGEL PI INDICATOR 8 (GLOVE) ×2
GLOVE BIOGEL PI INDICATOR 9 (GLOVE) ×2
GOWN STRL REUS W/TWL XL LVL3 (GOWN DISPOSABLE) ×6 IMPLANT
HEAD CERAMIC DELTA 36 PLUS 1.5 (Hips) ×3 IMPLANT
HOLDER FOLEY CATH W/STRAP (MISCELLANEOUS) ×3 IMPLANT
KIT TURNOVER KIT A (KITS) IMPLANT
LINER NEUTRAL 52X36MM PLUS 4 (Liner) ×3 IMPLANT
MANIFOLD NEPTUNE II (INSTRUMENTS) ×3 IMPLANT
NEEDLE HYPO 21X1.5 SAFETY (NEEDLE) ×6 IMPLANT
NS IRRIG 1000ML POUR BTL (IV SOLUTION) ×3 IMPLANT
PACK ANTERIOR HIP CUSTOM (KITS) ×3 IMPLANT
PENCIL SMOKE EVACUATOR (MISCELLANEOUS) IMPLANT
STEM FEMORAL SZ9 HIGH ACTIS (Stem) ×3 IMPLANT
SUT ETHIBOND NAB CT1 #1 30IN (SUTURE) ×3 IMPLANT
SUT VIC AB 0 CT1 27 (SUTURE)
SUT VIC AB 0 CT1 27XBRD ANBCTR (SUTURE) IMPLANT
SUT VIC AB 1 CTX 36 (SUTURE) ×2
SUT VIC AB 1 CTX36XBRD ANBCTR (SUTURE) ×1 IMPLANT
SUT VIC AB 2-0 CT1 27 (SUTURE)
SUT VIC AB 2-0 CT1 TAPERPNT 27 (SUTURE) IMPLANT
SUT VIC AB 3-0 CT1 27 (SUTURE) ×2
SUT VIC AB 3-0 CT1 TAPERPNT 27 (SUTURE) ×1 IMPLANT
SYR CONTROL 10ML LL (SYRINGE) ×9 IMPLANT
TRAY FOLEY MTR SLVR 16FR STAT (SET/KITS/TRAYS/PACK) IMPLANT

## 2020-07-17 NOTE — Interval H&P Note (Signed)
History and Physical Interval Note:  07/17/2020 7:00 AM  Keith Alvarado  has presented today for surgery, with the diagnosis of RIGHT HIP AVASCULAR NECROSIS.  The various methods of treatment have been discussed with the patient and family. After consideration of risks, benefits and other options for treatment, the patient has consented to  Procedure(s): RIGHT TOTAL HIP ARTHROPLASTY ANTERIOR APPROACH (Right) as a surgical intervention.  The patient's history has been reviewed, patient examined, no change in status, stable for surgery.  I have reviewed the patient's chart and labs.  Questions were answered to the patient's satisfaction.     Nestor Lewandowsky

## 2020-07-17 NOTE — Op Note (Signed)
PATIENT ID:      Keith Alvarado  MRN:     [id number] DOB/AGE:    January 20, 1976 / 44 y.o.  OPERATIVE REPORT   DATE OF PROCEDURE:  07/17/2020      PREOPERATIVE DIAGNOSIS:  RIGHT HIP AVASCULAR NECROSIS                                                         POSTOPERATIVE DIAGNOSIS:  Same                                                         PROCEDURE: Anterior R total hip arthroplasty using a 25 mm DePuy Pinnacle  Cup, [employer], 0-degree polyethylene liner, a +1 mm x 46mm ceramic head, a 9 hi Depuy Actis stem  SURGEON: Nestor Lewandowsky  ASSISTANT:   Tomi Likens. [employer]  (present throughout entire procedure and necessary for timely completion of the procedure)   ANESTHESIA: Spinal, Exparel 133mg  injection BLOOD LOSS: 400 cc FLUID REPLACEMENT: 1700 cc crystalloid TRANEXAMIC ACID: 1gm IV, 2gm Topical COMPLICATIONS: none    INDICATIONS FOR PROCEDURE: A 44 y.o. year-old With  RIGHT HIP AVASCULAR NECROSIS   for 3 years, x-rays show bone-on-bone arthritic changes, and osteophytes. Despite conservative measures with observation, anti-inflammatory medicine, narcotics, use of a cane, has severe unremitting pain and can ambulate only a few blocks before resting. Patient desires elective R total hip arthroplasty to decrease pain and increase function. The risks, benefits, and alternatives were discussed at length including but not limited to the risks of infection, bleeding, nerve injury, stiffness, blood clots, the need for revision surgery, cardiopulmonary complications, among others, and they were willing to proceed. Questions answered      PROCEDURE IN DETAIL: The patient was identified by armband,   received preoperative IV antibiotics in the holding area at [facility], taken to the operating room , appropriate anesthetic monitors   were attached and anesthesia was induced with the patient on the gurney. HANA boots were applied to the feet, and the patient  was transferred to  the HANA table with a peroneal post and support underneath the non-operative leg. Theoperative lower extremity was then prepped and draped in the usual sterile fashion from just above the iliac crest to the knee. And a timeout procedure was performed. [facility]. [name], critical for assistance with, positioning, exposure, retraction, instrumentation, and closure.Skin along incision area was injected with 10 cc of Exparel solution. We then made a 14 cm incision along the interval at the leading edge of the tensor fascia lata of starting at 2 cm lateral to the ASIS. Small bleeders in the skin and subcutaneous tissue identified and cauterized we dissected down to the fascia and made an incision in the fascia allowing [facility] to elevate the fascia of the tensor muscle and exploited the interval between the rectus and the tensor fascia lata. A Cobra retractor was then placed along the superior neck of the femur. A cerebellar retractor was used to expose the interval between the tensor fascia lata and the rectus femoris.  We  identified and cauterized the ascending branch of the anterior circumflex artery. A second Cobra retractor along the inferior neck of the femur. A small Hohmann retractor was placed underneath the origin of the rectus femoris, giving [place] good medial exposure. Using Ronguers fatty tissue was removed from in front of the anterior capsule. The capsule was then incised, starting out at the superior anterior rim of the acetabulum going laterally along the anterior neck. The capsule was then teed along the neck superiorly and inferiorly. Electrocautery was used to release capsule from the anterior and medial neck of the femur to allow external rotation. Cobra retractors were then placed along the inferior and superior neck allowing [place] to perform a standard neck cut and removed the femoral head with a power corkscrew. We then placed a medium bent homan retractor in  the cotyloid notch and posteriorly along the acetabular rim a narrow Cobra retractor. Exposed labral tissue and osteophytes were then removed. We then sequentially reamed up to a 51 mm basket reamer obtaining good coverage in all quadrants, verified by C-arm imaging. Under C-arm control we then hammered into place a 52 mm Pinnacle cup in 45 of abduction and 15 of anteversion. The cup seated nicely and required no supplemental screws. We then placed a central hole Eliminator and a 0 polyethylene liner. The foot was then externally rotated to 130-140. The limb was extended and adducted to the floor, delivering the proximal femur up into the wound. A medium curved Hohmann retractor was placed over the greater trochanter and a long Homan retractor along the posterior femoral neck completing the exposure and lateralizing the femur. We then performed releases superiorly and and inferiorly of the capsule going back to the pirformis fossa superiorly and to the lesser trochanter inferiorly. We then entered the proximal femur with the box cutting offset chisel followed by, a canal sounder, the chili pepper and broaching up to a 9 broach. This seated nicely and we reamed the calcar. A trial reduction was performed with a 1 mm X 36 mm head.The limb lengths were excellent the hip was stable in 90 of external rotation. At this point the trial components removed and we hammered into place a # 9 hi  Offset Actis stem with G[employer] coating. A + 1 mm x 36 head was then hammered into place. The hip was reduced and final C-arm images obtained. The wound was thoroughly irrigated with normal saline solution. We repaired the ant capsule and the tensor fascia lot a with running 0 vicryl suture. the subcutaneous tissue was closed with 2-0 and 3-0 Vicryl suture followed by an Aquacil dressing. At this point the patient was awaken and transferred to hospital gurney without difficulty.   Nestor Lewandowsky 07/17/2020, 7:00 AM

## 2020-07-17 NOTE — Discharge Instructions (Signed)

## 2020-07-17 NOTE — Transfer of Care (Signed)
Immediate Anesthesia Transfer of Care Note  Patient: Keith Alvarado  Procedure(s) Performed: RIGHT TOTAL HIP ARTHROPLASTY ANTERIOR APPROACH (Right Hip)  Patient Location: PACU  Anesthesia Type:Spinal  Level of Consciousness: awake and alert   Airway & Oxygen Therapy: Patient Spontanous Breathing and Patient connected to face mask oxygen  Post-op Assessment: Report given to RN and Post -op Vital signs reviewed and stable  Post vital signs: Reviewed and stable  Last Vitals:  Vitals Value Taken Time  BP 144/100 07/17/20 0936  Temp    Pulse 76 07/17/20 0939  Resp 15 07/17/20 0939  SpO2 100 % 07/17/20 0939  Vitals shown include unvalidated device data.  Last Pain:  Vitals:   07/17/20 0645  TempSrc: Oral  PainSc:       Patients Stated Pain Goal: 4 (07/17/20 0618)  Complications: No complications documented.

## 2020-07-17 NOTE — Progress Notes (Signed)
Physical Therapy Treatment Patient Details Name: Keith Alvarado MRN: [id number] DOB: May 15, 1976 Today's Date: 07/17/2020    History of Present Illness Patient is 44 y.o. 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     PT Comments    Patient seen for additional session for PT today. Pt demonstrated some improvements in LE muscle activation however required use of UE's to mobilize bil LE's to EOB and min-mod assist for transfers and gait. Pt was able to ambulate ~30 ft but remained unsteady with NBOS throughout. He c/o abdominal discomfort as well and assist provided to transfer to [facility] however pt was not successful in voiding bladder or having BM. He is not safe to discharge home today and will benefit from additional acute PT services to progress independence for safe discharge home.   Follow Up Recommendations  Follow surgeon's recommendation for DC plan and follow-up therapies;Home health PT     Equipment Recommendations  Rolling walker with 5" wheels;3in1 (PT)    Recommendations for Other Services       Precautions / Restrictions Precautions Precautions: Fall Restrictions Weight Bearing Restrictions: No Other Position/Activity Restrictions: WBAT    Mobility  Bed Mobility Overal bed mobility: Needs Assistance Bed Mobility: Supine to Sit     Supine to sit: Min guard;HOB elevated     General bed mobility comments: HOB slightly elevated, pt using bed rails and UE's to assist with bil LE mobility to move EOB. Extra time required.  Transfers Overall transfer level: Needs assistance Equipment used: Rolling walker (2 wheeled) Transfers: Sit to/from [employer] Sit to Stand: Min assist;Mod assist;From elevated surface Stand pivot transfers: Min assist;Mod assist;From elevated surface       General transfer comment: cues for technique with rising and for power up from EOB and recliner. Mod assist required to complete rise and pt unsteady with NBOS in  standing. Pt heavily reliant on bil UE for power up and to control lowering to sit in recliner and BSC.  Ambulation/Gait Ambulation/Gait assistance: Min assist;Mod assist Gait Distance (Feet): 30 Feet Assistive device: Rolling walker (2 wheeled) Gait Pattern/deviations: Step-to pattern;Shuffle;Narrow base of support;Decreased stride length;Decreased weight shift to right Gait velocity: decr   General Gait Details: pt unsteady with NBOS throughout gait. min assist required throughout and up to mod assist to manage RW safely with turns.    Stairs             Wheelchair Mobility    Modified Rankin (Stroke Patients Only)       Balance Overall balance assessment: Needs assistance Sitting-balance support: Feet supported Sitting balance-Leahy Scale: Fair     Standing balance support: During functional activity;Bilateral upper extremity supported Standing balance-Leahy Scale: Poor                              Cognition Arousal/Alertness: Awake/alert Behavior During Therapy: WFL for tasks assessed/performed;Restless Overall Cognitive Status: Within Functional Limits for tasks assessed                                 General Comments: pt mildly restless       Exercises      General Comments        Pertinent Vitals/Pain Pain Assessment: Faces Faces Pain Scale: Hurts little more Pain Location: stomach Pain Descriptors / Indicators: Discomfort Pain Intervention(s): Limited activity within patient's tolerance;Monitored during session;Repositioned  Home Living Family/patient expects to be discharged to:: Private residence Living Arrangements: Children;Parent Available Help at Discharge: Family Type of Home: House Home Access: Stairs to enter Entrance Stairs-Rails: Right;Left Home Layout: Two level;Bed/bath upstairs (split level) Home Equipment: Cane - single point;Grab bars - tub/shower Additional Comments: pt lives with 44 y.o. son.  he reports his father and sister will stay with him for 24 hours.     Prior Function Level of Independence: Independent;Independent with assistive device(s)      Comments: pt reports using a cane in home occasionally and a desk chair with wheels to move around in home when needed.   PT Goals (current goals can now be found in the care plan section) Acute Rehab PT Goals Patient Stated Goal: get recovered and stop hurting PT Goal Formulation: With patient Time For Goal Achievement: 07/24/20 Potential to Achieve Goals: Good Progress towards PT goals: Progressing toward goals    Frequency    7X/week      PT Plan Current plan remains appropriate    Co-evaluation              AM-PAC PT "6 Clicks" Mobility   Outcome Measure  Help needed turning from your back to your side while in a flat bed without using bedrails?: A Little Help needed moving from lying on your back to sitting on the side of a flat bed without using bedrails?: A Little Help needed moving to and from a bed to a chair (including a wheelchair)?: A Lot Help needed standing up from a chair using your arms (e.g., wheelchair or bedside chair)?: A Lot Help needed to walk in hospital room?: A Lot Help needed climbing 3-5 steps with a railing? : A Lot 6 Click Score: 14    End of Session Equipment Utilized During Treatment: Gait belt Activity Tolerance: Patient tolerated treatment well Patient left: in bed;with call bell/phone within reach Nurse Communication: Mobility status PT Visit Diagnosis: Muscle weakness (generalized) (M62.81);Other abnormalities of gait and mobility (R26.89);Difficulty in walking, not elsewhere classified (R26.2)     Time: 1351-1416 PT Time Calculation (min) (ACUTE ONLY): 25 min  Charges:  $Gait Training: 8-22 mins $Therapeutic Activity: 8-22 mins                     Wynn Maudlin, DPT Acute Rehabilitation Services  Office [phone] Pager [phone]  07/17/2020 2:52  PM

## 2020-07-17 NOTE — Evaluation (Signed)
Physical Therapy Evaluation Patient Details Name: Keith Alvarado MRN: [id number] DOB: 07-15-1976 Today's Date: 07/17/2020   History of Present Illness  Patient is 43 y.o. 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   Clinical Impression  Keith Alvarado is a 44 y.o. male POD 0 s/p Rt THA. Patient reports independence with occasional use of SPC in home for mobility at baseline. Patient is now limited by functional impairments (see PT problem list below). Patient motivated to participate in therapy however limited by weakness and transfers and gait deferred this session. Educated pt on ankle pumps for circulation. Patient will benefit from continued skilled PT interventions to address impairments and progress towards PLOF. Acute PT will follow to progress mobility and stair training in preparation for safe discharge home.     Follow Up Recommendations Follow surgeon's recommendation for DC plan and follow-up therapies;Home health PT    Equipment Recommendations  Rolling walker with 5" wheels;3in1 (PT)    Recommendations for Other Services       Precautions / Restrictions Precautions Precautions: Fall Restrictions Weight Bearing Restrictions: No Other Position/Activity Restrictions: WBAT      Mobility  Bed Mobility               General bed mobility comments: pt requires min-mod assist for bil LE mobility to move LE's in abduction/adduction to assist. deffered mobilizing to EOB and transfers due to weakness.    Transfers                    Ambulation/Gait                Stairs            Wheelchair Mobility    Modified Rankin (Stroke Patients Only)             Pertinent Vitals/Pain Pain Assessment: Faces Faces Pain Scale: Hurts a little bit Pain Location: stomach Pain Descriptors / Indicators: Discomfort Pain Intervention(s): Limited activity within patient's tolerance;Premedicated before session;Monitored during session    Home  Living Family/patient expects to be discharged to:: Private residence Living Arrangements: Children;Parent Available Help at Discharge: Family Type of Home: House Home Access: Stairs to enter Entrance Stairs-Rails: Doctor, general practice of Steps: 7 Home Layout: Two level;Bed/bath upstairs (split level) Home Equipment: Cane - single point;Grab bars - tub/shower Additional Comments: pt lives iwth 16 y.o. son. he reports his father and sister will stay with him for 24 hours.     Prior Function Level of Independence: Independent;Independent with assistive device(s)         Comments: pt reports using a cane in home occasionally and a desk chair with wheels to move around in home when needed.     Hand Dominance        Extremity/Trunk Assessment   Upper Extremity Assessment Upper Extremity Assessment: Overall WFL for tasks assessed    Lower Extremity Assessment Lower Extremity Assessment: Generalized weakness (poor strength; 2-/5 up to 2+/5 for Lt hip & bil ankles)    Cervical / Trunk Assessment Cervical / Trunk Assessment: Normal  Communication   Communication: No difficulties  Cognition Arousal/Alertness: Awake/alert Behavior During Therapy: WFL for tasks assessed/performed;Restless Overall Cognitive Status: Within Functional Limits for tasks assessed                                 General Comments: pt mildly restless       General Comments  Exercises     Assessment/Plan    PT Assessment Patient needs continued PT services  PT Problem List Decreased strength;Decreased range of motion;Decreased activity tolerance;Decreased balance;Decreased mobility;Decreased knowledge of use of DME;Decreased safety awareness;Decreased knowledge of precautions;Pain       PT Treatment Interventions DME instruction;Gait training;Stair training;Functional mobility training;Therapeutic activities;Therapeutic exercise;Balance training;Patient/family  education    PT Goals (Current goals can be found in the Care Plan section)  Acute Rehab PT Goals Patient Stated Goal: get recovered and stop hurting PT Goal Formulation: With patient Time For Goal Achievement: 07/24/20 Potential to Achieve Goals: Good    Frequency 7X/week   Barriers to discharge Inaccessible home environment pt has to ascend 14 steps to get to main living area    Co-evaluation               AM-PAC PT "6 Clicks" Mobility  Outcome Measure Help needed turning from your back to your side while in a flat bed without using bedrails?: A Little Help needed moving from lying on your back to sitting on the side of a flat bed without using bedrails?: A Little Help needed moving to and from a bed to a chair (including a wheelchair)?: A Lot Help needed standing up from a chair using your arms (e.g., wheelchair or bedside chair)?: A Lot Help needed to walk in hospital room?: A Lot Help needed climbing 3-5 steps with a railing? : A Lot 6 Click Score: 14    End of Session   Activity Tolerance: Patient tolerated treatment well;Other (comment) (limited by weakness from spinal block) Patient left: in bed;with call bell/phone within reach Nurse Communication: Mobility status PT Visit Diagnosis: Muscle weakness (generalized) (M62.81);Other abnormalities of gait and mobility (R26.89);Difficulty in walking, not elsewhere classified (R26.2)    Time: 4098-1191 PT Time Calculation (min) (ACUTE ONLY): 17 min   Charges:   PT Evaluation $PT Eval Low Complexity: 1 Low          Wynn Maudlin, DPT Acute Rehabilitation Services  Office [phone] Pager [phone]  07/17/2020 1:11 PM

## 2020-07-17 NOTE — Anesthesia Procedure Notes (Signed)
Spinal  Patient location during procedure: OR Start time: 07/17/2020 7:21 AM End time: 07/17/2020 7:27 AM Staffing Anesthesiologist: Eilene Ghazi, MD Preanesthetic Checklist Completed: patient identified, IV checked, site marked, risks and benefits discussed, surgical consent, monitors and equipment checked, pre-op evaluation and timeout performed Spinal Block Patient position: sitting Prep: DuraPrep Patient monitoring: heart rate, cardiac monitor, continuous pulse ox and blood pressure Approach: midline Location: L3-4 Injection technique: single-shot Needle Needle type: Sprotte  Needle gauge: 24 G Needle length: 9 cm Assessment Sensory level: T6

## 2020-07-17 NOTE — Anesthesia Preprocedure Evaluation (Signed)
Anesthesia Evaluation  Patient identified by MRN, date of birth, ID band Patient awake    Reviewed: Allergy & Precautions, NPO status , Patient's Chart, lab work & pertinent test results  Airway Mallampati: II  TM Distance: >3 FB Neck ROM: Full    Dental no notable dental hx.    Pulmonary neg pulmonary ROS,    Pulmonary exam normal breath sounds clear to auscultation       Cardiovascular negative cardio ROS Normal cardiovascular exam Rhythm:Regular Rate:Normal     Neuro/Psych negative neurological ROS  negative psych ROS   GI/Hepatic negative GI ROS, Neg liver ROS,   Endo/Other  negative endocrine ROS  Renal/GU negative Renal ROS  negative genitourinary   Musculoskeletal negative musculoskeletal ROS (+)   Abdominal   Peds negative pediatric ROS (+)  Hematology negative hematology ROS (+)   Anesthesia Other Findings   Reproductive/Obstetrics negative OB ROS                             Anesthesia Physical Anesthesia Plan  ASA: II  Anesthesia Plan: Spinal   Post-op Pain Management:    Induction: Intravenous  PONV Risk Score and Plan: 2 and Ondansetron, Dexamethasone and Treatment may vary due to age or medical condition  Airway Management Planned: Simple Face Mask  Additional Equipment:   Intra-op Plan:   Post-operative Plan:   Informed Consent: I have reviewed the patients History and Physical, chart, labs and discussed the procedure including the risks, benefits and alternatives for the proposed anesthesia with the patient or authorized representative who has indicated his/her understanding and acceptance.   Dental advisory given  Plan Discussed with: CRNA and Surgeon  Anesthesia Plan Comments:         Anesthesia Quick Evaluation  

## 2020-07-17 NOTE — Anesthesia Postprocedure Evaluation (Signed)
Anesthesia Post Note  Patient: Keith Alvarado  Procedure(s) Performed: RIGHT TOTAL HIP ARTHROPLASTY ANTERIOR APPROACH (Right Hip)     Patient location during evaluation: PACU Anesthesia Type: Spinal Level of consciousness: oriented and awake and alert Pain management: pain level controlled Vital Signs Assessment: post-procedure vital signs reviewed and stable Respiratory status: spontaneous breathing, respiratory function stable and patient connected to nasal cannula oxygen Cardiovascular status: blood pressure returned to baseline and stable Postop Assessment: no headache, no backache and no apparent nausea or vomiting Anesthetic complications: no   No complications documented.  Last Vitals:  Vitals:   07/17/20 0945 07/17/20 1000  BP: (!) 149/95 (!) 166/98  Pulse: 79 76  Resp: (!) 21 (!) 21  Temp:    SpO2: 100% 100%    Last Pain:  Vitals:   07/17/20 1000  TempSrc:   PainSc: 8                  Garison Genova S

## 2020-07-17 NOTE — Anesthesia Procedure Notes (Signed)
Date/Time: 07/17/2020 7:16 AM Performed by: Minerva Ends, CRNA Pre-anesthesia Checklist: Patient identified, Suction available, Emergency Drugs available, Patient being monitored and Timeout performed Patient Re-evaluated:Patient Re-evaluated prior to induction Oxygen Delivery Method: Simple face mask Placement Confirmation: positive ETCO2 and breath sounds checked- equal and bilateral Dental Injury: Teeth and Oropharynx as per pre-operative assessment

## 2020-07-18 ENCOUNTER — Encounter (HOSPITAL_COMMUNITY): Payer: Self-pay | Admitting: Orthopedic Surgery

## 2020-07-18 DIAGNOSIS — M1611 Unilateral primary osteoarthritis, right hip: Secondary | ICD-10-CM | POA: Diagnosis not present

## 2020-07-18 NOTE — Progress Notes (Signed)
Patient ID: Keith Alvarado, male   DOB: 06/28/1976, 44 y.o.   MRN: [id number] PATIENT ID: Keith Alvarado  MRN: [id number]  DOB/AGE:  1976-01-14 / 44 y.o.  1 Day Post-Op Procedure(s) (LRB): RIGHT TOTAL HIP ARTHROPLASTY ANTERIOR APPROACH (Right)    PROGRESS NOTE Subjective: Patient is alert, oriented, no Nausea, no Vomiting, yes passing gas. Taking PO well. Denies SOB, Chest or Calf Pain. Using Incentive Spirometer, PAS in place. Ambulate 30', Patient reports pain as 3/10 .    Objective: Vital signs in last 24 hours: Vitals:   07/17/20 1925 07/17/20 2119 07/18/20 0055 07/18/20 0450  BP: 140/80 129/89 (Abnormal) 153/91 (Abnormal) 144/94  Pulse: 92 88 100 81  Resp: 18 18  18   Temp: 98.9 F (37.2 C) 98.1 F (36.7 C) 98.4 F (36.9 C) 98.4 F (36.9 C)  TempSrc: Oral Oral Oral Oral  SpO2: 100% 100% 100% 100%  Weight:      Height:          Intake/Output from previous day: I/O last 3 completed shifts: In: 4975.9 [P.O.:2220; I.V.:2755.9] Out: 5251 [Urine:4851; Blood:400]   Intake/Output this shift: No intake/output data recorded.   LABORATORY DATA: No results for input(s): WBC, HGB, HCT, PLT, NA, K, CL, CO2, BUN, CREATININE, GLUCOSE, GLUCAP, INR, CALCIUM in the last 72 hours.  Invalid input(s): PT, 2  Examination: Neurologically intact ABD soft Neurovascular intact Sensation intact distally Intact pulses distally Dorsiflexion/Plantar flexion intact Incision: dressing C/D/I No cellulitis present Compartment soft}  Assessment:   1 Day Post-Op Procedure(s) (LRB): RIGHT TOTAL HIP ARTHROPLASTY ANTERIOR APPROACH (Right) ADDITIONAL DIAGNOSIS: Expected Acute Blood Loss Anemia,   Patient's anticipated LOS is less than 2 midnights, meeting these requirements: - Younger than 44 - Lives within 1 hour of care - Has a competent adult at home to recover with post-op recover - NO history of  - Chronic pain requiring opiods  - Diabetes  - Coronary Artery Disease  - Heart  failure  - Heart attack  - Stroke  - DVT/VTE  - Cardiac arrhythmia  - Respiratory Failure/COPD  - Renal failure  - Anemia  - Advanced Liver disease       Plan: PT/OT WBAT, AROM and PROM  DVT Prophylaxis:  SCDx72hrs, ASA 81 mg BID x 2 weeks DISCHARGE PLAN: Home, after passes PT DISCHARGE NEEDS: HHPT, Walker and 3-in-1 comode seat     44 07/18/2020, 7:33 AM

## 2020-07-18 NOTE — Plan of Care (Signed)
  Problem: Education: Goal: Knowledge of General Education information will improve Description Including pain rating scale, medication(s)/side effects and non-pharmacologic comfort measures Outcome: Progressing   

## 2020-07-18 NOTE — Discharge Summary (Signed)
Patient ID: Keith Alvarado MRN: [id number] DOB/AGE: Aug 14, 1976 44 y.o.  Admit date: 07/17/2020 Discharge date: 07/18/2020  Admission Diagnoses:  Principal Problem:   AVN of femur [facility])   Discharge Diagnoses:  Same  Past Medical History:  Diagnosis Date  . Pneumonia     Surgeries: Procedure(s): RIGHT TOTAL HIP ARTHROPLASTY ANTERIOR APPROACH on 07/17/2020   Consultants:   Discharged Condition: Improved  Hospital Course: Keith Alvarado is an 44 y.o. male who was admitted 07/17/2020 for operative treatment ofAVN of femur (HCC). Patient has severe unremitting pain that affects sleep, daily activities, and work/hobbies. After pre-op clearance the patient was taken to the operating room on 07/17/2020 and underwent  Procedure(s): RIGHT TOTAL HIP ARTHROPLASTY ANTERIOR APPROACH.    Patient was given perioperative antibiotics:  Anti-infectives (From admission, onward)   Start     Dose/Rate Route Frequency Ordered Stop   07/17/20 0600  ceFAZolin (ANCEF) IVPB 2g/100 mL premix        2 g 200 mL/hr over 30 Minutes Intravenous On call to O.R. 07/17/20 8101 07/17/20 0726       Patient was given sequential compression devices, early ambulation, and chemoprophylaxis to prevent DVT.  Patient benefited maximally from hospital stay and there were no complications.    Recent vital signs:  Patient Vitals for the past 24 hrs:  BP Temp Temp src Pulse Resp SpO2  07/18/20 0450 (Abnormal) 144/94 98.4 F (36.9 C) Oral 81 18 100 %  07/18/20 0055 (Abnormal) 153/91 98.4 F (36.9 C) Oral 100 no documentation 100 %  07/17/20 2119 129/89 98.1 F (36.7 C) Oral 88 18 100 %  07/17/20 1925 140/80 98.9 F (37.2 C) Oral 92 18 100 %  07/17/20 1921 139/79 no documentation no documentation 90 19 100 %  07/17/20 1806 (Abnormal) 145/77 98.9 F (37.2 C) Oral 82 19 100 %  07/17/20 1710 (Abnormal) 151/89 97.9 F (36.6 C) Oral 88 17 97 %  07/17/20 1627 139/89 98.9 F (37.2 C) Oral 73 19 100 %  07/17/20  1500 (Abnormal) 167/98 98.4 F (36.9 C) no documentation 75 16 100 %  07/17/20 1430 (Abnormal) 173/66 no documentation no documentation 71 no documentation 100 %  07/17/20 1400 (Abnormal) 143/96 no documentation no documentation no documentation no documentation no documentation  07/17/20 1300 (Abnormal) 166/95 no documentation no documentation 78 no documentation 100 %  07/17/20 1230 no documentation no documentation no documentation 87 no documentation 100 %  07/17/20 1215 no documentation no documentation no documentation 72 no documentation 100 %  07/17/20 1200 (Abnormal) 165/94 no documentation no documentation 95 no documentation 100 %  07/17/20 1100 (Abnormal) 157/97 no documentation no documentation 75 12 100 %  07/17/20 1045 (Abnormal) 147/98 98.1 F (36.7 C) no documentation 76 16 100 %  07/17/20 1030 (Abnormal) 144/96 no documentation no documentation 77 18 100 %  07/17/20 1015 (Abnormal) 153/98 no documentation no documentation 79 19 100 %  07/17/20 1000 (Abnormal) 166/98 no documentation no documentation 76 (Abnormal) 21 100 %  07/17/20 0945 (Abnormal) 149/95 no documentation no documentation 79 (Abnormal) 21 100 %  07/17/20 0936 (Abnormal) 144/100 98.3 F (36.8 C) no documentation 87 18 100 %     Recent laboratory studies: No results for input(s): WBC, HGB, HCT, PLT, NA, K, CL, CO2, BUN, CREATININE, GLUCOSE, INR, CALCIUM in the last 72 hours.  Invalid input(s): PT, 2   Discharge Medications:   Allergies as of 07/18/2020   No Known Allergies     Medication List    Stop  taking these medications   HYDROcodone-acetaminophen 7.5-325 MG tablet Commonly known as: Norco   methylPREDNISolone 4 MG Tbpk tablet Commonly known as: MEDROL DOSEPAK   traMADol 50 MG tablet Commonly known as: ULTRAM     Take these medications   aspirin EC 81 MG tablet Take 1 tablet (81 mg total) by mouth 2 (two) times daily.   multivitamin with minerals Tabs tablet Take 1 tablet by mouth  daily. Centrum   OVER THE COUNTER MEDICATION Take 1 capsule by mouth in the morning and at bedtime. [place]   oxyCODONE-acetaminophen 5-325 MG tablet Commonly known as: PERCOCET/ROXICET Take 1 tablet by mouth every 4 (four) hours as needed for severe pain.   tiZANidine 2 MG tablet Commonly known as: ZANAFLEX Take 1 tablet (2 mg total) by mouth every 6 (six) hours as needed.   trolamine salicylate 10 % cream Commonly known as: ASPERCREME Apply 1 application topically 4 (four) times daily as needed for muscle pain.        Durable Medical Equipment  (From admission, onward)         Start     Ordered   07/17/20 0953  DME Walker rolling  Once       Question:  Patient needs a walker to treat with the following condition  Answer:  Status post right hip replacement   07/17/20 0952   07/17/20 0953  DME 3 n 1  Once        07/17/20 5400           Discharge Care Instructions  (From admission, onward)         Start     Ordered   07/18/20 0000  Change dressing       Comments: Change dressing Only if drainage exceeds 40% of window on dressing   07/18/20 0737          Diagnostic Studies: DG Chest 2 View  Result Date: 07/06/2020 CLINICAL DATA:  Preop for hip replacement. EXAM: CHEST - 2 VIEW COMPARISON:  None. FINDINGS: The heart size and mediastinal contours are within normal limits. Both lungs are clear. The visualized skeletal structures are unremarkable. IMPRESSION: No active cardiopulmonary disease. Electronically Signed   By: Lupita Raider M.D.   On: 07/06/2020 09:49   DG C-Arm 1-60 Min-No Report  Result Date: 07/17/2020 Fluoroscopy was utilized by the requesting physician.  No radiographic interpretation.   DG HIP OPERATIVE UNILAT W OR W/O PELVIS RIGHT  Result Date: 07/17/2020 CLINICAL DATA:  Right hip replacement. EXAM: OPERATIVE RIGHT HIP (WITH PELVIS IF PERFORMED) 1 VIEW TECHNIQUE: Fluoroscopic spot image(s) were submitted for interpretation  post-operatively. COMPARISON:  06/06/2020 FINDINGS: Fluoroscopic images demonstrate placement of a right hip arthroplasty. Arthroplasty appears located on this single view. Sclerosis in the left femoral head is suggestive for AVN. IMPRESSION: Right hip replacement without complicating features. Electronically Signed   By: Richarda Overlie M.D.   On: 07/17/2020 09:10    Disposition: Discharge disposition: 01-Home or Self Care       Discharge Instructions    Call MD / Call 911   Complete by: As directed    If you experience chest pain or shortness of breath, CALL 911 and be transported to the hospital emergency room.  If you develope a fever above 101 F, pus (white drainage) or increased drainage or redness at the wound, or calf pain, call your surgeon's office.   Change dressing   Complete by: As directed    Change dressing  Only if drainage exceeds 40% of window on dressing   Constipation Prevention   Complete by: As directed    Drink plenty of fluids.  Prune juice may be helpful.  You may use a stool softener, such as Colace (over the counter) 100 mg twice a day.  Use MiraLax (over the counter) for constipation as needed.   Diet - low sodium heart healthy   Complete by: As directed    Increase activity slowly as tolerated   Complete by: As directed        Follow-up Information    Gean Birchwood, MD In 2 weeks.   Specialty: Orthopedic Surgery Contact information: [address] [place] [address] [phone]                Signed: Nestor Lewandowsky 07/18/2020, 7:37 AM

## 2020-07-18 NOTE — Plan of Care (Signed)
All discharge teaching given to Pt. All questions were answered.

## 2020-07-18 NOTE — Progress Notes (Signed)
Pt requested to increase the strength of percocet for discharge. Educated on pain management, Pt verbalized understanding. PA Minerva Areola is paged and notified.

## 2020-07-18 NOTE — TOC Initial Note (Signed)
Transition of Care [facility]) - Initial/Assessment Note    Patient Details  Name: Keith Alvarado MRN: [id number] Date of Birth: 02/20/1976  Transition of Care (TOC) CM/SW Contact:    Armanda Heritage, RN Phone Number: 07/18/2020, 10:26 AM  Clinical Narrative:                 Patient set up with [facility] for HHPT.  [employer] to provide rolling walker and 3in1.  Expected Discharge Plan: Home w Home Health Services Barriers to Discharge: No Barriers Identified   Patient Goals and CMS Choice Patient states their goals for this hospitalization and ongoing recovery are:: to go home with therapy CMS Medicare.gov Compare Post Acute Care list provided to:: Patient Choice offered to / list presented to : Patient  Expected Discharge Plan and Services Expected Discharge Plan: Home w Home Health Services   Discharge Planning Services: CM Consult Post Acute Care Choice: Home Health Living arrangements for the past 2 months: Single Family Home Expected Discharge Date: 07/18/20               DME Arranged: Dan Humphreys rolling, 3-N-1 DME Agency: [employer] Date DME Agency Contacted: 07/18/20 Time DME Agency Contacted: 1026 Representative spoke with at DME Agency: Harrold Donath HH Arranged: PT HH Agency: [facility]     Representative spoke with at [facility] Agency: pre-arranged in md office  Prior Living Arrangements/Services Living arrangements for the past 2 months: Single Family Home   Patient language and need for interpreter reviewed:: Yes Do you feel safe going back to the place where you live?: Yes      Need for Family Participation in Patient Care: Yes (Comment) Care giver support system in place?: Yes (comment)   Criminal Activity/Legal Involvement Pertinent to Current Situation/Hospitalization: No - Comment as needed  Activities of Daily Living Home Assistive Devices/Equipment: Cane (specify quad or straight) ADL Screening (condition at time of  admission) Patient's cognitive ability adequate to safely complete daily activities?: Yes Is the patient deaf or have difficulty hearing?: No Does the patient have difficulty seeing, even when wearing glasses/contacts?: No Does the patient have difficulty concentrating, remembering, or making decisions?: No Patient able to express need for assistance with ADLs?: Yes Does the patient have difficulty dressing or bathing?: Yes Independently performs ADLs?: Yes (appropriate for developmental age) Does the patient have difficulty walking or climbing stairs?: Yes Weakness of Legs: Both Weakness of Arms/Hands: None  Permission Sought/Granted                  Emotional Assessment Appearance:: Appears stated age Attitude/Demeanor/Rapport: Engaged Affect (typically observed): Accepting Orientation: : Oriented to Self, Oriented to Place, Oriented to  Time, Oriented to Situation   Psych Involvement: No (comment)  Admission diagnosis:  AVN of femur (HCC) [M87.059] Patient Active Problem List   Diagnosis Date Noted  . AVN of femur (HCC) 07/07/2020   PCP:  Patient, [name] Pharmacy:   [id number] Gin[place] Otto,  - [address] AT [facility] [address] [facility] [address] [place] [address] Phone: [phone] Fax: [phone]     Social Determinants of Health (SDOH) Interventions    Readmission Risk Interventions No flowsheet data found.

## 2020-07-18 NOTE — Progress Notes (Signed)
Physical Therapy Treatment Patient Details Name: Keith Alvarado MRN: [id number] DOB: 31-May-1976 Today's Date: 07/18/2020    History of Present Illness Patient is 44 y.o. 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     PT Comments    POD # 1 am session Pt feeling better and "able to feel my leg" today.  Assisted OOB.  General bed mobility comments: demonstarted and instructed how to use a belt to self assist LE.  General transfer comment: good use of hands to steady self only required increased time.  General Gait Details: slow but steady gait tolerating an increased distance 175 feet.  Practiced stairs.  Then returned to room to perform some TE's following HEP handout.  Instructed on proper tech, freq as well as use of ICE.   Addressed all mobility questions, discussed appropriate activity, educated on use of ICE.  Pt ready for D/C to home.   Follow Up Recommendations  Follow surgeons recommendation for DC plan and follow-up therapies;Home health PT     Equipment Recommendations  Rolling walker with 5" wheels;3in1 (PT)    Recommendations for Other Services       Precautions / Restrictions Precautions Precautions: Fall Restrictions Weight Bearing Restrictions: No RLE Weight Bearing: Weight bearing as tolerated Other Position/Activity Restrictions: WBAT    Mobility  Bed Mobility Overal bed mobility: Needs Assistance Bed Mobility: Supine to Sit;Sit to Supine     Supine to sit: Supervision Sit to supine: Supervision;Min guard   General bed mobility comments: demonstarted and instructed how to use a belt to self assist LE  Transfers Overall transfer level: Needs assistance Equipment used: Rolling walker (2 wheeled) Transfers: Sit to/from [employer] Transfers Sit to Stand: Supervision Stand pivot transfers: Supervision;Min guard       General transfer comment: good use of hands to steady self only required increased time  Ambulation/Gait Ambulation/Gait  assistance: Supervision;Min guard Gait Distance (Feet): 175 Feet Assistive device: Rolling walker (2 wheeled) Gait Pattern/deviations: Step-to pattern;Shuffle;Decreased stride length;Decreased weight shift to right Gait velocity: decreased   General Gait Details: slow but steady gait tolerating an increased distance   Stairs Stairs: Yes Stairs assistance: Min guard Stair Management: Two rails;Step to pattern;Forwards Number of Stairs: 6 General stair comments: 25% VC's on proper sequencing and safety   Wheelchair Mobility    Modified Rankin (Stroke Patients Only)       Balance                                            Cognition Arousal/Alertness: Awake/alert Behavior During Therapy: WFL for tasks assessed/performed;Restless Overall Cognitive Status: Within Functional Limits for tasks assessed                                        Exercises   Total Hip Replacement TE's following HEP Handout 10 reps ankle pumps 05 reps knee presses 05 reps heel slides 05 reps SAQ's 05 reps ABD Instructed how to use a belt loop to assist  Followed by ICE     General Comments        Pertinent Vitals/Pain Pain Assessment: 0-10 Faces Pain Scale: Hurts little more Pain Location: R anterior hip Pain Descriptors / Indicators: Discomfort;Aching;Burning;Tightness Pain Intervention(s): Monitored during session;Premedicated before session;Repositioned;Ice applied    Home Living  Prior Function            PT Goals (current goals can now be found in the care plan section) Progress towards PT goals: Progressing toward goals    Frequency    7X/week      PT Plan Current plan remains appropriate    Co-evaluation              AM-PAC PT "6 Clicks" Mobility   Outcome Measure  Help needed turning from your back to your side while in a flat bed without using bedrails?: None Help needed moving from lying on  your back to sitting on the side of a flat bed without using bedrails?: None Help needed moving to and from a bed to a chair (including a wheelchair)?: None Help needed standing up from a chair using your arms (e.g., wheelchair or bedside chair)?: None Help needed to walk in hospital room?: None Help needed climbing 3-5 steps with a railing? : A Little 6 Click Score: 23    End of Session Equipment Utilized During Treatment: Gait belt Activity Tolerance: Patient tolerated treatment well Patient left: in bed;with call bell/phone within reach Nurse Communication: Mobility status (pt ready for D/C tohome after one session) PT Visit Diagnosis: Muscle weakness (generalized) (M62.81);Other abnormalities of gait and mobility (R26.89);Difficulty in walking, not elsewhere classified (R26.2)     Time: 4403-4742 PT Time Calculation (min) (ACUTE ONLY): 33 min  Charges:  $Gait Training: 8-22 mins $Therapeutic Exercise: 8-22 mins                     Felecia Shelling  PTA Acute  Rehabilitation Services Pager      [phone] Office      [phone]

## 2020-08-01 ENCOUNTER — Other Ambulatory Visit: Payer: Self-pay

## 2020-08-01 ENCOUNTER — Ambulatory Visit: Payer: Medicaid Other | Attending: Orthopedic Surgery

## 2020-08-01 DIAGNOSIS — M25651 Stiffness of right hip, not elsewhere classified: Secondary | ICD-10-CM | POA: Insufficient documentation

## 2020-08-01 DIAGNOSIS — M25551 Pain in right hip: Secondary | ICD-10-CM | POA: Diagnosis not present

## 2020-08-01 DIAGNOSIS — Z96641 Presence of right artificial hip joint: Secondary | ICD-10-CM | POA: Diagnosis present

## 2020-08-01 DIAGNOSIS — R262 Difficulty in walking, not elsewhere classified: Secondary | ICD-10-CM | POA: Diagnosis present

## 2020-08-01 DIAGNOSIS — M6281 Muscle weakness (generalized): Secondary | ICD-10-CM | POA: Diagnosis present

## 2020-08-01 NOTE — Therapy (Addendum)
[facility] [address] [place], [place], [address] Phone: [phone]   Fax:  [phone]  Physical Therapy Evaluation  Patient Details  Name: Keith Alvarado MRN: [id number] Date of Birth: 16-Jun-1976 Referring Provider (PT): Gean Birchwood, MD   Encounter Date: 08/01/2020   PT End of Session - 08/01/20 1211    Visit Number 1    Number of Visits 25    Date for PT Re-Evaluation 10/13/20    Authorization Type [employer]    PT Start Time 1130    PT Stop Time 1210    PT Time Calculation (min) 40 min    Activity Tolerance Patient tolerated treatment well    Behavior During Therapy [facility] for tasks assessed/performed           Past Medical History:  Diagnosis Date  . Pneumonia     Past Surgical History:  Procedure Laterality Date  . FOOT SURGERY Left   . TOTAL HIP ARTHROPLASTY Right 07/17/2020   Procedure: RIGHT TOTAL HIP ARTHROPLASTY ANTERIOR APPROACH;  Surgeon: Gean Birchwood, MD;  Location: [facility];  Service: Orthopedics;  Laterality: Right;    There were no vitals filed for this visit.    Subjective Assessment - 08/01/20 1131    Subjective Pt reports THA post injury playing with child.  AVN  started and needed replacement. HHPT x 1 weeks.    He has walked and doing leg exercises. 2x/day.    Limitations Standing;Walking;Sitting    How long can you sit comfortably? 1-2 hours    How long can you walk comfortably? houshold distances    Patient Stated Goals To be able to polay golf and walk normal without pain    Currently in Pain? Yes    Pain Score 5     Pain Location Hip    Pain Orientation Right;Anterior    Pain Descriptors / Indicators Aching;Discomfort    Pain Type Surgical pain    Pain Onset 1 to 4 weeks ago    Pain Frequency Constant    Aggravating Factors  Getting out of chair and sitting from standing    Pain Relieving Factors Meds  ice              [facility] PT Assessment - 08/01/20 0001       Assessment   Medical Diagnosis RT THA  anterior    Referring Provider (PT) Gean Birchwood, MD    Onset Date/Surgical Date 07/17/20   March 2021   Next MD Visit 08/20/20    Prior Therapy HHPT      Precautions   Precautions Anterior Hip      Restrictions   Weight Bearing Restrictions No      Balance Screen   Has the patient fallen in the past 6 months No      Prior Function   Level of Independence Requires assistive device for independence;Needs assistance with homemaking;Needs assistance with ADLs    Vocation Full time employment    Vocation Requirements 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 and tolerate s less sitting      Cognition   Overall Cognitive Status Within Functional Limits for tasks assessed      ROM / Strength   AROM / PROM / Strength AROM;Strength      AROM   AROM Assessment Site Hip;Knee    Right/Left Hip Right    Right Hip Extension -20    Right Hip Flexion 85    Right Hip External Rotation  10    Right Hip Internal Rotation  10    Right Hip ABduction 10   LT 20   Right/Left Knee Right;Left    Right Knee Extension 0    Right Knee Flexion 123    Left Knee Extension 0    Left Knee Flexion 123      Strength   Strength Assessment Site Hip;Knee;Ankle    Right/Left Hip Right;Left    Right Hip Flexion 4+/5    Right Hip Extension 3+/5    Right Hip External Rotation  4+/5    Right Hip Internal Rotation 4+/5    Right Hip ABduction 3-/5    Right Hip ADduction 4/5    Left Hip Flexion 5/5    Left Hip Extension 4+/5    Left Hip External Rotation 5/5    Right/Left Knee Left;Right    Right Knee Flexion 4+/5    Right Knee Extension 4+/5    Left Knee Flexion 5/5    Left Knee Extension 5/5    Right Ankle Dorsiflexion 5/5    Left Ankle Dorsiflexion 5/5                      Objective measurements completed on examination: See above findings.       [facility] Adult PT Treatment/Exercise - 08/01/20 0001      Exercises   Exercises Knee/Hip       Knee/Hip Exercises: Stretches   Hip Flexor Stretch Right;2 reps;20 seconds      Knee/Hip Exercises: Seated   Long Arc Quad Right;10 reps    Heel Slides Right;5 sets    Abduction/Adduction  Right;5 sets                  PT Education - 08/01/20 1209    Education Details POC    HEP    Person(s) Educated Patient    Methods Explanation;Demonstration;Tactile cues;Verbal cues;Handout    Comprehension Verbalized understanding;Returned demonstration            PT Short Term Goals - 08/01/20 1218      PT SHORT TERM GOAL #1   Title He wil be independent with initial HEP    Baseline HHPT program , tighness RT hip ext -20 degrees    Time 3    Period Weeks    Status New      PT SHORT TERM GOAL #2   Title He will walk without device in and out of home for short distance to and from car    Baseline using SPC full time    Time 4    Period Weeks    Status New      PT SHORT TERM GOAL #3   Title He will improve hip ext RT to -5 degrees    Baseline -20 degrees    Time 4    Period Weeks    Status New      PT SHORT TERM GOAL #4   Title He will improve active RT hip flexion to 115 degrees    Baseline 5 degrees    Time 4    Period Weeks    Status New             PT Long Term Goals - 08/01/20 1220      PT LONG TERM GOAL #1   Title He will be independent with all HEP issued    Baseline independent with initial HEP    Time 10    Period  Weeks    Status New      PT LONG TERM GOAL #2   Title He will walk no device all areas of life    Baseline using SPC full time    Time 10    Period Weeks    Status New      PT LONG TERM GOAL #3   Title He will be able to return to work 1/2 to full time    Baseline not working    Time 10    Period Weeks    Status New      PT LONG TERM GOAL #4   Title He will be able to climb stairs one rail step over step    Baseline step by step    Time 10    Period Weeks    Status New      PT LONG TERM GOAL #5   Title RT hip flexion  active to 125 degrees to ease transitions  into car    Baseline 115 degrees    Time 10    Period Weeks    Status New      Additional Long Term Goals   Additional Long Term Goals Yes      PT LONG TERM GOAL #6   Title RT hip ext to 5 degrees or better to improve walking pattern.    Baseline -5    Time 10    Period Weeks    Status New                  Plan - 08/01/20 1213    Clinical Impression Statement Mr Elisabeth MostStevenson presents  post RTTHA due to AVN.  He is 2 weeks out and is doing well walking with cane and  doing his HEP. He has lmitied ROm actie and passive RT hip and weakness in hips and thigh on RT.  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  He gets some assist after showering and with dressing.  Mr Elisabeth MostStevenson will benefit from skilled PT to progress to wlaking without a device and return to norma home and work activity.    Personal Factors and Comorbidities Time since onset of injury/illness/exacerbation    Examination-Activity Limitations Locomotion Level;Bed Mobility;Sit;Squat;Stairs;Stand    Examination-Participation Restrictions Community Activity;Cleaning;Occupation;Driving    Stability/Clinical Decision Making Evolving/Moderate complexity    Clinical Decision Making Moderate    Rehab Potential Good    PT Frequency --   3 visits   PT Duration --   2-3 weeks then  2x/week for  10 weeks   PT Treatment/Interventions Passive range of motion;Manual techniques;Patient/family education;Stair training;Gait training;Therapeutic activities;Therapeutic exercise    PT Next Visit Plan REview HEP and manual for ROM    PT Home Exercise Plan HHPT exercises , hip flexor stretching    Consulted and Agree with Plan of Care Patient           Check all possible CPT codes: [id number]- Therapeutic Exercise, [phone]- Neuro Re-education, [phone] - Gait Training, [phone] - Manual Therapy, [id number] - Therapeutic Activities and [phone] - Self Care        Patient will  benefit from skilled therapeutic intervention in order to improve the following deficits and impairments:  Pain,Decreased strength,Decreased balance,Decreased activity tolerance,Increased muscle spasms,Decreased range of motion,Difficulty walking  Visit Diagnosis: Pain in right hip  Status post total hip replacement, right  Difficulty in walking, not elsewhere classified  Muscle weakness (generalized)  Stiffness of right  hip, not elsewhere classified     Problem List Patient Active Problem List   Diagnosis Date Noted  . 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 [place], [place], [address] Phone: [phone]   Fax:  [phone]  Name: Keith Alvarado MRN: [id number] Date of Birth: [birth year]

## 2020-08-01 NOTE — Patient Instructions (Signed)
Hip flexor stretch RT 3 reps 3x/day 10-20 sec stretch

## 2020-08-07 ENCOUNTER — Ambulatory Visit: Payer: Medicaid Other | Admitting: Physical Therapy

## 2020-08-07 ENCOUNTER — Telehealth: Payer: Self-pay | Admitting: Physical Therapy

## 2020-08-07 NOTE — Telephone Encounter (Signed)
LVM regarding missed appointment today and reminded of when his next scheduled appointment is. If he is unable to make his appointment to call and we can work to cancel or reschedule that appointment for him.   Samin Milke PT, DPT, LAT, ATC  08/07/20  1:28 PM

## 2020-08-14 ENCOUNTER — Ambulatory Visit: Payer: Medicaid Other

## 2020-08-14 ENCOUNTER — Other Ambulatory Visit: Payer: Self-pay

## 2020-08-14 DIAGNOSIS — R262 Difficulty in walking, not elsewhere classified: Secondary | ICD-10-CM

## 2020-08-14 DIAGNOSIS — M25551 Pain in right hip: Secondary | ICD-10-CM

## 2020-08-14 DIAGNOSIS — M25651 Stiffness of right hip, not elsewhere classified: Secondary | ICD-10-CM

## 2020-08-14 DIAGNOSIS — M6281 Muscle weakness (generalized): Secondary | ICD-10-CM

## 2020-08-14 DIAGNOSIS — Z96641 Presence of right artificial hip joint: Secondary | ICD-10-CM

## 2020-08-14 NOTE — Therapy (Addendum)
[place], [place], [address] Phone: [phone]   Fax:  [phone]  Physical Therapy Treatment/ Re-Eval/Discharge Summary  Patient Details  Name: Keith Alvarado MRN: [id number] Date of Birth: 1976-02-18 Referring Provider (PT): Frederik Pear, MD   Encounter Date: 08/14/2020   PT End of Session - 08/14/20 1148    Visit Number 2    Number of Visits 18   Date for PT Re-Evaluation 10/13/20    Authorization Type [employer]    PT Start Time 1140    PT Stop Time 1220    PT Time Calculation (min) 40 min    Activity Tolerance Patient tolerated treatment well;No increased pain    Behavior During Therapy WFL for tasks assessed/performed           Past Medical History:  Diagnosis Date  . Pneumonia     Past Surgical History:  Procedure Laterality Date  . FOOT SURGERY Left   . TOTAL HIP ARTHROPLASTY Right 07/17/2020   Procedure: RIGHT TOTAL HIP ARTHROPLASTY ANTERIOR APPROACH;  Surgeon: Frederik Pear, MD;  Location: [facility];  Service: Orthopedics;  Laterality: Right;    There were no vitals filed for this visit.   Subjective Assessment - 08/14/20 1153    Subjective Pain is better and is about a 5/10 today    Pain Score 5     Pain Location Hip    Pain Orientation Right;Anterior    Pain Descriptors / Indicators Aching    Pain Type Surgical pain    Pain Onset More than a month ago    Pain Frequency Constant    Aggravating Factors  walk and getting out of chair    Pain Relieving Factors meds , rest.             AROM :     RT hip flexion  100 degrees                    RT hip abduction  15 deg                   RT hip extension  -15 deg                [facility] Adult PT Treatment/Exercise - 08/14/20 0001      Knee/Hip Exercises: Aerobic   Nustep UE/LE  L5   8 min      Knee/Hip Exercises: Supine   Bridges Both;20 reps    Bridges with [employer] Both;20 reps    Bridges  with [employer] Both;20 reps    Straight Leg Raises Right;Left;10 reps    Other Supine Knee/Hip Exercises bent knee riase  x 20 RT/LT green band     LTR RT/LT x 15      Knee/Hip Exercises: Sidelying   Hip ABduction Right;20 reps    Clams RT x 20      Knee/Hip Exercises: Prone   Hamstring Curl 15 reps    Hamstring Curl Limitations RT/LT    Hip Extension Right;Left;15 reps                  PT Education - 08/14/20 1229    Education Details HEP    Person(s) Educated Patient    Methods Explanation;Tactile cues;Verbal cues;Handout    Comprehension Verbalized understanding;Returned demonstration            PT Short Term Goals - 08/01/20 1218      PT SHORT TERM  GOAL #1   Title He wil be independent with initial HEP    Baseline He is progressing on initial HHPT program , tighness RT hip ext -15 degrees    Time 3    Period Weeks    Status Partly met      PT SHORT TERM GOAL #2   Title He will walk without device in and out of home for short distance to and from car    Baseline using SPC full time , trying at home no device for short periods   Time 4    Period Weeks    Status Ongoing      PT SHORT TERM GOAL #3   Title He will improve hip ext RT to -5 degrees    Baseline -15 degrees    Time 4    Period Weeks    Status ongoing     PT SHORT TERM GOAL #4   Title He will improve active RT hip flexion to 115 degrees    Baseline 100 degrees    Time 4    Period Weeks    Status Ongoing            PT Long Term Goals - 08/01/20 1220      PT LONG TERM GOAL #1   Title He will be independent with all HEP issued    Baseline independent with initial HEP    Time 10    Period Weeks    Status New      PT LONG TERM GOAL #2   Title He will walk no device all areas of life    Baseline using SPC full time    Time 10    Period Weeks    Status New      PT LONG TERM GOAL #3   Title He will be able to return to work 1/2 to full time    Baseline not working    Time 10     Period Weeks    Status New      PT LONG TERM GOAL #4   Title He will be able to climb stairs one rail step over step    Baseline step by step    Time 10    Period Weeks    Status New      PT LONG TERM GOAL #5   Title RT hip flexion active to 125 degrees to ease transitions  into car    Baseline 115 degrees    Time 10    Period Weeks    Status New      Additional Long Term Goals   Additional Long Term Goals Yes      PT LONG TERM GOAL #6   Title RT hip ext to 5 degrees or better to improve walking pattern.    Baseline -5    Time 10    Period Weeks    Status New                 Plan - 08/14/20 1229    Clinical Impression Statement Tolerated all HEP without incr pain Cautioned to stop any exercises that cause pain though may have some muscular soreness with exercises. Discussed min risk of dislocation and HEP presents no risk but he should stop anything that gives pain. He has improved his AROM and appears to be HEP complient   PT Treatment/Interventions Passive range of motion;Manual techniques;Patient/family education;Stair training;Gait training;Therapeutic activities;Therapeutic exercise    PT Next Visit Plan  REview HEP and add if needed , manual for ROM/soreness  will add 1 visit each week until auth for MCD  is approved    PT Home Exercise Plan HHPT exercises , hip flexor stretching, bridge, LTR , SLR, hip adduction squeeze with pillow and clam    Consulted and Agree with Plan of Care Patient           Patient will benefit from skilled therapeutic intervention in order to improve the following deficits and impairments:  Pain,Decreased strength,Decreased balance,Decreased activity tolerance,Increased muscle spasms,Decreased range of motion,Difficulty walking  Visit Diagnosis: Pain in right hip  Status post total hip replacement, right  Difficulty in walking, not elsewhere classified  Muscle weakness (generalized)  Stiffness of right hip, not elsewhere  classified     Problem List Patient Active Problem List   Diagnosis Date Noted  . AVN of femur ([place]) 07/07/2020   PHYSICAL THERAPY DISCHARGE SUMMARY  Visits from Start of Care: 2  Current functional level related to goals / functional outcomes: See above   Remaining deficits: See above   Education / Equipment: See above  Plan: Patient agrees to discharge.  Patient goals were not met. Patient is being discharged due to not returning since the last visit.  ?????    Multiple no-shows resulting in patient D/C  Haydee Monica, PT, DPT 09/08/20 4:01 PM      Darrel Hoover  PT 08/14/2020, 12:33 PM  [facility] [address] [place], [place], [address] Phone: [phone]   Fax:  [phone]  Name: Chasyn Cinque MRN: [id number] Date of Birth: [birth year]

## 2020-08-14 NOTE — Patient Instructions (Signed)
Bridge , LTR, marching, ball squeeze, clam,  SLR RT/LT 2x/day 10-20 reps

## 2020-08-16 ENCOUNTER — Ambulatory Visit: Payer: Medicaid Other

## 2020-09-08 ENCOUNTER — Telehealth: Payer: Self-pay

## 2020-09-08 ENCOUNTER — Ambulatory Visit: Payer: Medicaid Other | Attending: Orthopedic Surgery

## 2020-09-08 NOTE — Telephone Encounter (Signed)
PT called and spoke with pt regarding his 3rd no show. Since patient has now had multiple no shows, he was informed that he would need to obtain a new referral for skilled PT. Pt stated "okay" and hung up.  Rhea Bleacher, PT, DPT 09/08/20 3:58 PM

## 2020-11-16 ENCOUNTER — Other Ambulatory Visit: Payer: Self-pay | Admitting: Orthopedic Surgery

## 2020-11-16 DIAGNOSIS — Z01818 Encounter for other preprocedural examination: Secondary | ICD-10-CM

## 2020-11-29 NOTE — Patient Instructions (Addendum)
DUE TO COVID-19 ONLY ONE VISITOR IS ALLOWED TO COME WITH YOU AND STAY IN THE WAITING ROOM ONLY DURING PRE OP AND PROCEDURE DAY OF SURGERY. THE 2 VISITOR  MAY VISIT WITH YOU AFTER SURGERY IN YOUR PRIVATE ROOM DURING VISITING HOURS ONLY!  YOU NEED TO HAVE A COVID 19 TEST ON__4/21_____ @2 :45_____, THIS TEST MUST BE DONE BEFORE SURGERY,  COVID TESTING SITE [address] [place] , IT IS ON THE RIGHT GOING OUT [address] APPROXIMATELY  2 MINUTES PAST [employer] ON THE RIGHT. ONCE YOUR COVID TEST IS COMPLETED,  PLEASE BEGIN THE QUARANTINE INSTRUCTIONS AS OUTLINED IN YOUR HANDOUT.                Admir Tyke Outman    Your procedure is scheduled on: 12/11/20   Report to [facility] Main  Entrance   Report to admitting at  8:45 AM     Call this number if you have problems the morning of surgery [phone]    BRUSH YOUR TEETH MORNING OF SURGERY AND RINSE YOUR MOUTH OUT, NO CHEWING GUM CANDY OR MINTS.   No food after midnight.    You may have clear liquid until 8:00 AM  .  At 7:30 AM drink pre surgery drink  . Nothing by mouth after 8:00 AM.   Take these medicines the morning of surgery with A SIP OF WATER: none                                 You may not have any metal on your body including              piercings  Do not wear jewelry, lotions, powders or deodorant              Men may shave face and neck.   Do not bring valuables to the hospital. [facility] IS NOT             RESPONSIBLE   FOR VALUABLES.  Contacts, dentures or bridgework may not be worn into surgery.      Patients discharged the day of surgery will not be allowed to drive home.  IF YOU ARE HAVING SURGERY AND GOING HOME THE SAME DAY, YOU MUST HAVE AN ADULT TO DRIVE YOU HOME AND BE WITH YOU FOR 24 HOURS. YOU MAY GO HOME BY TAXI OR UBER OR ORTHERWISE, BUT AN ADULT MUST ACCOMPANY YOU HOME AND STAY WITH YOU FOR 24 HOURS.  Name and phone number of your driver:  Special  Instructions: N/A              Please read over the following fact sheets you were given: _____________________________________________________________________             [facility] - Preparing for Surgery Before surgery, you can play an important role.  Because skin is not sterile, your skin needs to be as free of germs as possible.  You can reduce the number of germs on your skin by washing with CHG (chlorahexidine gluconate) soap before surgery.  CHG is an antiseptic cleaner which kills germs and bonds with the skin to continue killing germs even after washing. Please DO NOT use if you have an allergy to CHG or antibacterial soaps.  If your skin becomes reddened/irritated stop using the CHG and inform your nurse when you arrive at Short Stay. .  You may shave your face/neck.  Please follow these instructions carefully:  1.  Shower with CHG Soap the night before surgery and the  morning of Surgery.  2.  If you choose to wash your hair, wash your hair first as usual with your  normal  shampoo.  3.  After you shampoo, rinse your hair and body thoroughly to remove the  shampoo.                                        4.  Use CHG as you would any other liquid soap.  You can apply chg directly  to the skin and wash                       Gently with a scrungie or clean washcloth.  5.  Apply the CHG Soap to your body ONLY FROM THE NECK DOWN.   Do not use on face/ open                           Wound or open sores. Avoid contact with eyes, ears mouth and genitals (private parts).                       Wash face,  Genitals (private parts) with your normal soap.             6.  Wash thoroughly, paying special attention to the area where your surgery  will be performed.  7.  Thoroughly rinse your body with warm water from the neck down.  8.  DO NOT shower/wash with your normal soap after using and rinsing off  the CHG Soap.             9.  Pat yourself dry with a clean towel.            10.  Wear  clean pajamas.            11.  Place clean sheets on your bed the night of your first shower and do not  sleep with pets. Day of Surgery : Do not apply any lotions/deodorants the morning of surgery.  Please wear clean clothes to the hospital/surgery center.  FAILURE TO FOLLOW THESE INSTRUCTIONS MAY RESULT IN THE CANCELLATION OF YOUR SURGERY PATIENT SIGNATURE_________________________________  NURSE SIGNATURE__________________________________  ________________________________________________________________________   Rogelia Mire  An incentive spirometer is a tool that can help keep your lungs clear and active. This tool measures how well you are filling your lungs with each breath. Taking long deep breaths may help reverse or decrease the chance of developing breathing (pulmonary) problems (especially infection) following:  A long period of time when you are unable to move or be active. BEFORE THE PROCEDURE   If the spirometer includes an indicator to show your best effort, your nurse or respiratory therapist will set it to a desired goal.  If possible, sit up straight or lean slightly forward. Try not to slouch.  Hold the incentive spirometer in an upright position. INSTRUCTIONS FOR USE  1. Sit on the edge of your bed if possible, or sit up as far as you can in bed or on a chair. 2. Hold the incentive spirometer in an upright position. 3. Breathe out normally. 4. Place the mouthpiece in your mouth and seal your lips tightly around it. 5. Breathe in slowly and  as deeply as possible, raising the piston or the ball toward the top of the column. 6. Hold your breath for 3-5 seconds or for as long as possible. Allow the piston or ball to fall to the bottom of the column. 7. Remove the mouthpiece from your mouth and breathe out normally. 8. Rest for a few seconds and repeat Steps 1 through 7 at least 10 times every 1-2 hours when you are awake. Take your time and take a few normal  breaths between deep breaths. 9. The spirometer may include an indicator to show your best effort. Use the indicator as a goal to work toward during each repetition. 10. After each set of 10 deep breaths, practice coughing to be sure your lungs are clear. If you have an incision (the cut made at the time of surgery), support your incision when coughing by placing a pillow or rolled up towels firmly against it. Once you are able to get out of bed, walk around indoors and cough well. You may stop using the incentive spirometer when instructed by your caregiver.  RISKS AND COMPLICATIONS  Take your time so you do not get dizzy or light-headed.  If you are in pain, you may need to take or ask for pain medication before doing incentive spirometry. It is harder to take a deep breath if you are having pain. AFTER USE  Rest and breathe slowly and easily.  It can be helpful to keep track of a log of your progress. Your caregiver can provide you with a simple table to help with this. If you are using the spirometer at home, follow these instructions: SEEK MEDICAL CARE IF:   You are having difficultly using the spirometer.  You have trouble using the spirometer as often as instructed.  Your pain medication is not giving enough relief while using the spirometer.  You develop fever of 100.5 F (38.1 C) or higher. SEEK IMMEDIATE MEDICAL CARE IF:   You cough up bloody sputum that had not been present before.  You develop fever of 102 F (38.9 C) or greater.  You develop worsening pain at or near the incision site. MAKE SURE YOU:   Understand these instructions.  Will watch your condition.  Will get help right away if you are not doing well or get worse. Document Released: 12/16/2006 Document Revised: 10/28/2011 Document Reviewed: 02/16/2007 [facility] Patient Information 2014 [place], [place].   ________________________________________________________________________

## 2020-11-30 ENCOUNTER — Ambulatory Visit (HOSPITAL_COMMUNITY)
Admission: RE | Admit: 2020-11-30 | Discharge: 2020-11-30 | Disposition: A | Discharge status: Home / Self Care | Payer: Medicaid Other | Source: Ambulatory Visit | Attending: Orthopedic Surgery | Admitting: Orthopedic Surgery

## 2020-11-30 ENCOUNTER — Encounter (HOSPITAL_COMMUNITY)
Admission: RE | Admit: 2020-11-30 | Discharge: 2020-11-30 | Disposition: A | Discharge status: Home / Self Care | Payer: Medicaid Other | Source: Ambulatory Visit | Attending: Orthopedic Surgery | Admitting: Orthopedic Surgery

## 2020-11-30 ENCOUNTER — Other Ambulatory Visit: Payer: Self-pay

## 2020-11-30 ENCOUNTER — Encounter (HOSPITAL_COMMUNITY): Payer: Self-pay

## 2020-11-30 DIAGNOSIS — Z01818 Encounter for other preprocedural examination: Secondary | ICD-10-CM | POA: Diagnosis not present

## 2020-11-30 HISTORY — DX: Other specified health status: Z78.9

## 2020-11-30 LAB — URINALYSIS, ROUTINE W REFLEX MICROSCOPIC
Bacteria, UA: NONE SEEN
Bilirubin Urine: NEGATIVE
Glucose, UA: NEGATIVE mg/dL
Hgb urine dipstick: NEGATIVE
Ketones, ur: NEGATIVE mg/dL
Leukocytes,Ua: NEGATIVE
Nitrite: NEGATIVE
Protein, ur: 30 mg/dL — AB
Specific Gravity, Urine: 1.009 (ref 1.005–1.030)
pH: 6 (ref 5.0–8.0)

## 2020-11-30 LAB — TYPE AND SCREEN
ABO/RH(D): A POS
Antibody Screen: NEGATIVE

## 2020-11-30 LAB — BASIC METABOLIC PANEL
Anion gap: 10 (ref 5–15)
BUN: <5 mg/dL — ABNORMAL LOW (ref 6–20)
CO2: 28 mmol/L (ref 22–32)
Calcium: 9.2 mg/dL (ref 8.9–10.3)
Chloride: 103 mmol/L (ref 98–111)
Creatinine, Ser: 0.75 mg/dL (ref 0.61–1.24)
GFR, Estimated: >60 mL/min (ref >60)
Glucose, Bld: 87 mg/dL (ref 70–99)
Potassium: 4.1 mmol/L (ref 3.5–5.1)
Sodium: 141 mmol/L (ref 135–145)

## 2020-11-30 LAB — CBC WITH DIFFERENTIAL/PLATELET
Abs Immature Granulocytes: 0.12 10*3/uL — ABNORMAL HIGH (ref 0.00–0.07)
Basophils Absolute: 0.1 10*3/uL (ref 0.0–0.1)
Basophils Relative: 1 %
Eosinophils Absolute: 0.1 10*3/uL (ref 0.0–0.5)
Eosinophils Relative: 2 %
HCT: 46.6 % (ref 39.0–52.0)
Hemoglobin: 15.4 g/dL (ref 13.0–17.0)
Immature Granulocytes: 2 %
Lymphocytes Relative: 40 %
Lymphs Abs: 2.3 10*3/uL (ref 0.7–4.0)
MCH: 32.1 pg (ref 26.0–34.0)
MCHC: 33 g/dL (ref 30.0–36.0)
MCV: 97.1 fL (ref 80.0–100.0)
Monocytes Absolute: 0.7 10*3/uL (ref 0.1–1.0)
Monocytes Relative: 12 %
Neutro Abs: 2.5 10*3/uL (ref 1.7–7.7)
Neutrophils Relative %: 43 %
Platelets: 457 10*3/uL — ABNORMAL HIGH (ref 150–400)
RBC: 4.8 MIL/uL (ref 4.22–5.81)
RDW: 13.5 % (ref 11.5–15.5)
WBC: 5.8 10*3/uL (ref 4.0–10.5)
nRBC: 0 % (ref 0.0–0.2)

## 2020-11-30 LAB — SURGICAL PCR SCREEN
MRSA, PCR: NEGATIVE
Staphylococcus aureus: POSITIVE — AB

## 2020-11-30 LAB — APTT: aPTT: 29 seconds (ref 24–36)

## 2020-11-30 LAB — PROTIME-INR
INR: 1 (ref 0.8–1.2)
Prothrombin Time: 12.8 seconds (ref 11.4–15.2)

## 2020-11-30 NOTE — Progress Notes (Signed)
COVID Vaccine Completed:No Date COVID Vaccine completed: COVID vaccine manufacturer: Pfizer    Moderna   [employer]   PCP -none Cardiologist - none  Chest x-ray - 07/06/20-epic EKG - 11/21/20-epic Stress Test - no ECHO - no Cardiac Cath - no Pacemaker/ICD device last checked:NA  Sleep Study - no CPAP -   Fasting Blood Sugar - NA Checks Blood Sugar _____ times a day  Blood Thinner Instructions:NA Aspirin Instructions: Last Dose:  Anesthesia review:   Patient denies shortness of breath, fever, cough and chest pain at PAT appointment yes  Patient verbalized understanding of instructions that were given to them at the PAT appointment. Patient was also instructed that they will need to review over the PAT instructions again at home before surgery. Yes.  Pt has no SOB with any activities. He has a needle phobia.

## 2020-12-06 DIAGNOSIS — M8700 Idiopathic aseptic necrosis of unspecified bone: Secondary | ICD-10-CM | POA: Diagnosis present

## 2020-12-06 NOTE — H&P (Signed)
TOTAL HIP ADMISSION H&P  Patient is admitted for left total hip arthroplasty.  Subjective:  Chief Complaint: left hip pain  HPI: Keith Alvarado, 45 y.o. male, has a history of pain and functional disability in the left hip(s) due to AVN and patient has failed non-surgical conservative treatments for greater than 12 weeks to include NSAID's and/or analgesics, flexibility and strengthening excercises, supervised PT with diminished ADL's post treatment, use of assistive devices, weight reduction as appropriate and activity modification.  Onset of symptoms was gradual starting 1 years ago with rapidlly worsening course since that time.The patient noted no past surgery on the left hip(s).  Patient currently rates pain in the left hip at 10 out of 10 with activity. Patient has night pain, worsening of pain with activity and weight bearing, trendelenberg gait, pain that interfers with activities of daily living and pain with passive range of motion. Patient has evidence of AVN by imaging studies. This condition presents safety issues increasing the risk of falls. This patient has had avascular necrosis of the hip, acetabular fracture, hip dysplasia.  There is no current active infection.  Patient Active Problem List   Diagnosis Date Noted  . AVN (avascular necrosis of bone) (HCC) 12/06/2020  . 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 Medical history non-contributory     Past Surgical History:  Procedure Laterality Date  . FOOT SURGERY Left   . JOINT REPLACEMENT Right 2021  . TOTAL HIP ARTHROPLASTY Right 07/17/2020   Procedure: RIGHT TOTAL HIP ARTHROPLASTY ANTERIOR APPROACH;  Surgeon: Gean Birchwood, MD;  Location: [facility];  Service: Orthopedics;  Laterality: Right;    No current facility-administered medications for this encounter.   Current Outpatient Medications  Medication Sig Dispense Refill Last Dose  . Multiple Vitamin (MULTIVITAMIN WITH MINERALS)  TABS tablet Take 1 tablet by mouth daily. Centrum     . OVER THE COUNTER MEDICATION Take 2 capsules by mouth daily. [employer]     . tiZANidine (ZANAFLEX) 2 MG tablet Take 1 tablet (2 mg total) by mouth every 6 (six) hours as needed. 60 tablet 0   . traMADol (ULTRAM) 50 MG tablet Take 50 mg by mouth every 6 (six) hours as needed for moderate pain.     Marland Kitchen aspirin EC 81 MG tablet Take 1 tablet (81 mg total) by mouth 2 (two) times daily. (Patient not taking: No sig reported) 60 tablet 0 Not Taking at Unknown time  . oxyCODONE-acetaminophen (PERCOCET/ROXICET) 5-325 MG tablet Take 1 tablet by mouth every 4 (four) hours as needed for severe pain. (Patient not taking: No sig reported) 30 tablet 0 Not Taking at Unknown time   No Known Allergies  Social History   Tobacco Use  . Smoking status: Never Smoker  . Smokeless tobacco: Never Used  Substance Use Topics  . Alcohol use: Yes    Alcohol/week: 8.0 standard drinks    Types: 8 Cans of beer per week    Comment: every other day    Family History  Problem Relation Age of Onset  . Healthy Mother   . Healthy Father      Review of Systems  Constitutional: Positive for chills, diaphoresis and fever.  HENT: Negative.   Eyes: Negative.   Respiratory: Negative.   Cardiovascular: Negative.   Gastrointestinal: Negative.   Endocrine: Negative.   Genitourinary: Negative.        ED  Musculoskeletal: Positive for arthralgias and myalgias.  Skin: Negative.  Allergic/Immunologic: Negative.   Neurological: Negative.   Hematological: Negative.   Psychiatric/Behavioral: Positive for sleep disturbance.    Objective:  Physical Exam Constitutional:      Appearance: Normal appearance. He is normal weight.  HENT:     Head: Normocephalic and atraumatic.     Nose: Nose normal.  Eyes:     Pupils: Pupils are equal, round, and reactive to light.  Cardiovascular:     Pulses: Normal pulses.  Pulmonary:     Effort: Pulmonary effort is normal.   Musculoskeletal:        General: Tenderness present.     Cervical back: Normal range of motion and neck supple.     Comments: The left hip internal rotation is to 0 with severe pain external rotation to 40 with severe pain foot tap is negative flexors abductors and abductors are all intact.    Skin:    General: Skin is warm and dry.  Neurological:     General: No focal deficit present.     Mental Status: He is alert and oriented to person, place, and time. Mental status is at baseline.  Psychiatric:        Mood and Affect: Mood normal.        Behavior: Behavior normal.        Thought Content: Thought content normal.        Judgment: Judgment normal.     Vital signs in last 24 hours:    Labs:   Estimated body mass index is 21.29 kg/m as calculated from the following:   Height as of 11/30/20: 6' (1.829 m).   Weight as of 11/30/20: 71.2 kg.   Imaging Review Plain radiographs demonstrate AVN with collapse of the left femoral head.    Assessment/Plan:  End stage arthritis, left hip(s)  The patient history, physical examination, clinical judgement of the provider and imaging studies are consistent with end stage degenerative joint disease of the left hip(s) and total hip arthroplasty is deemed medically necessary. The treatment options including medical management, injection therapy, arthroscopy and arthroplasty were discussed at length. The risks and benefits of total hip arthroplasty were presented and reviewed. The risks due to aseptic loosening, infection, stiffness, dislocation/subluxation,  thromboembolic complications and other imponderables were discussed.  The patient acknowledged the explanation, agreed to proceed with the plan and consent was signed. Patient is being admitted for inpatient treatment for surgery, pain control, PT, OT, prophylactic antibiotics, VTE prophylaxis, progressive ambulation and ADL's and discharge planning.The patient is planning to be discharged  home with home health services    Patient's anticipated LOS is less than 2 midnights, meeting these requirements: - Younger than 81 - Lives within 1 hour of care - Has a competent adult at home to recover with post-op recover - NO history of  - Chronic pain requiring opiods  - Diabetes  - Coronary Artery Disease  - Heart failure  - Heart attack  - Stroke  - DVT/VTE  - Cardiac arrhythmia  - Respiratory Failure/COPD  - Renal failure  - Anemia  - Advanced Liver disease

## 2020-12-07 ENCOUNTER — Other Ambulatory Visit (HOSPITAL_COMMUNITY)
Admission: RE | Admit: 2020-12-07 | Discharge: 2020-12-07 | Disposition: A | Discharge status: Home / Self Care | Payer: Medicaid Other | Source: Ambulatory Visit | Attending: Orthopedic Surgery | Admitting: Orthopedic Surgery

## 2020-12-07 DIAGNOSIS — Z20822 Contact with and (suspected) exposure to covid-19: Secondary | ICD-10-CM | POA: Diagnosis not present

## 2020-12-07 DIAGNOSIS — Z01812 Encounter for preprocedural laboratory examination: Secondary | ICD-10-CM | POA: Insufficient documentation

## 2020-12-07 LAB — SARS CORONAVIRUS 2 (TAT 6-24 HRS): SARS Coronavirus 2: NEGATIVE

## 2020-12-10 MED ORDER — BUPIVACAINE LIPOSOME 1.3 % IJ SUSP
10.0000 mL | Freq: Once | INTRAMUSCULAR | Status: DC
  Filled 2020-12-10: qty 10

## 2020-12-10 MED ORDER — TRANEXAMIC ACID 1000 MG/10ML IV SOLN
2000.0000 mg | INTRAVENOUS | Status: DC
  Filled 2020-12-10: qty 20

## 2020-12-11 ENCOUNTER — Ambulatory Visit (HOSPITAL_COMMUNITY): Payer: Medicaid Other | Admitting: Anesthesiology

## 2020-12-11 ENCOUNTER — Encounter (HOSPITAL_COMMUNITY): Payer: Self-pay | Admitting: Orthopedic Surgery

## 2020-12-11 ENCOUNTER — Other Ambulatory Visit: Payer: Self-pay

## 2020-12-11 ENCOUNTER — Ambulatory Visit (HOSPITAL_COMMUNITY)
Admission: RE | Admit: 2020-12-11 | Discharge: 2020-12-12 | Disposition: A | Discharge status: Home / Self Care | Payer: Medicaid Other | Attending: Orthopedic Surgery | Admitting: Orthopedic Surgery

## 2020-12-11 ENCOUNTER — Ambulatory Visit (HOSPITAL_COMMUNITY): Payer: Medicaid Other

## 2020-12-11 ENCOUNTER — Encounter (HOSPITAL_COMMUNITY)
Admission: RE | Disposition: A | Discharge status: Home / Self Care | Payer: Self-pay | Source: Home / Self Care | Attending: Orthopedic Surgery

## 2020-12-11 DIAGNOSIS — Z96642 Presence of left artificial hip joint: Secondary | ICD-10-CM

## 2020-12-11 DIAGNOSIS — Z7982 Long term (current) use of aspirin: Secondary | ICD-10-CM | POA: Diagnosis not present

## 2020-12-11 DIAGNOSIS — Z419 Encounter for procedure for purposes other than remedying health state, unspecified: Secondary | ICD-10-CM

## 2020-12-11 DIAGNOSIS — Z96641 Presence of right artificial hip joint: Secondary | ICD-10-CM | POA: Diagnosis not present

## 2020-12-11 DIAGNOSIS — M1612 Unilateral primary osteoarthritis, left hip: Secondary | ICD-10-CM | POA: Diagnosis not present

## 2020-12-11 DIAGNOSIS — M8700 Idiopathic aseptic necrosis of unspecified bone: Secondary | ICD-10-CM

## 2020-12-11 HISTORY — PX: TOTAL HIP ARTHROPLASTY: SHX124

## 2020-12-11 SURGERY — ARTHROPLASTY, HIP, TOTAL, ANTERIOR APPROACH
Anesthesia: Spinal | Site: Hip | Laterality: Left

## 2020-12-11 MED ORDER — DOCUSATE SODIUM 100 MG PO CAPS
100.0000 mg | ORAL_CAPSULE | Freq: Two times a day (BID) | ORAL | Status: DC
  Administered 2020-12-11 – 2020-12-12 (×2): 100 mg via ORAL
  Filled 2020-12-11 (×2): qty 1

## 2020-12-11 MED ORDER — KCL IN DEXTROSE-NACL 20-5-0.45 MEQ/L-%-% IV SOLN
INTRAVENOUS | Status: DC
  Filled 2020-12-11 (×2): qty 1000

## 2020-12-11 MED ORDER — OXYCODONE-ACETAMINOPHEN 5-325 MG PO TABS: 1.0000 | ORAL_TABLET | ORAL | 0 refills | Status: DC | PRN

## 2020-12-11 MED ORDER — PROPOFOL 10 MG/ML IV BOLUS
INTRAVENOUS | Status: DC | PRN
  Administered 2020-12-11 (×2): 50 mg via INTRAVENOUS

## 2020-12-11 MED ORDER — DEXAMETHASONE SODIUM PHOSPHATE 10 MG/ML IJ SOLN
10.0000 mg | Freq: Once | INTRAMUSCULAR | Status: AC
  Administered 2020-12-12: 10 mg via INTRAVENOUS
  Filled 2020-12-11: qty 1

## 2020-12-11 MED ORDER — BISACODYL 5 MG PO TBEC
5.0000 mg | DELAYED_RELEASE_TABLET | Freq: Every day | ORAL | Status: DC | PRN
Start: 2020-12-11 — End: 2020-12-12

## 2020-12-11 MED ORDER — SODIUM CHLORIDE 0.9% FLUSH
INTRAVENOUS | Status: DC | PRN
  Administered 2020-12-11: 50 mL

## 2020-12-11 MED ORDER — DEXAMETHASONE SODIUM PHOSPHATE 10 MG/ML IJ SOLN
INTRAMUSCULAR | Status: DC | PRN
  Administered 2020-12-11: 10 mg via INTRAVENOUS

## 2020-12-11 MED ORDER — CEFAZOLIN SODIUM-DEXTROSE 2-4 GM/100ML-% IV SOLN
2.0000 g | INTRAVENOUS | Status: AC
  Administered 2020-12-11: 2 g via INTRAVENOUS
  Filled 2020-12-11: qty 100

## 2020-12-11 MED ORDER — HYDROMORPHONE HCL 1 MG/ML IJ SOLN
0.5000 mg | INTRAMUSCULAR | Status: DC | PRN
Start: 2020-12-11 — End: 2020-12-12

## 2020-12-11 MED ORDER — DIPHENHYDRAMINE HCL 12.5 MG/5ML PO ELIX: 12.5000 mg | ORAL_SOLUTION | ORAL | Status: DC | PRN

## 2020-12-11 MED ORDER — ONDANSETRON HCL 4 MG PO TABS: 4.0000 mg | ORAL_TABLET | Freq: Four times a day (QID) | ORAL | Status: DC | PRN

## 2020-12-11 MED ORDER — MIDAZOLAM HCL 2 MG/2ML IJ SOLN
INTRAMUSCULAR | Status: AC
  Filled 2020-12-11: qty 2

## 2020-12-11 MED ORDER — FLEET ENEMA 7-19 GM/118ML RE ENEM: 1.0000 | ENEMA | Freq: Once | RECTAL | Status: DC | PRN

## 2020-12-11 MED ORDER — PROPOFOL 1000 MG/100ML IV EMUL
INTRAVENOUS | Status: AC
  Filled 2020-12-11: qty 200

## 2020-12-11 MED ORDER — ONDANSETRON HCL 4 MG/2ML IJ SOLN
INTRAMUSCULAR | Status: AC
  Filled 2020-12-11: qty 2

## 2020-12-11 MED ORDER — PROPOFOL 10 MG/ML IV BOLUS
INTRAVENOUS | Status: AC
  Filled 2020-12-11: qty 20

## 2020-12-11 MED ORDER — BUPIVACAINE-EPINEPHRINE 0.25% -1:200000 IJ SOLN
INTRAMUSCULAR | Status: DC | PRN
  Administered 2020-12-11: 30 mL

## 2020-12-11 MED ORDER — FENTANYL CITRATE (PF) 100 MCG/2ML IJ SOLN
INTRAMUSCULAR | Status: AC
  Filled 2020-12-11: qty 2

## 2020-12-11 MED ORDER — BUPIVACAINE-EPINEPHRINE (PF) 0.25% -1:200000 IJ SOLN
INTRAMUSCULAR | Status: AC
  Filled 2020-12-11: qty 30

## 2020-12-11 MED ORDER — 0.9 % SODIUM CHLORIDE (POUR BTL) OPTIME
TOPICAL | Status: DC | PRN
  Administered 2020-12-11: 1000 mL

## 2020-12-11 MED ORDER — METOCLOPRAMIDE HCL 5 MG PO TABS: 5.0000 mg | ORAL_TABLET | Freq: Three times a day (TID) | ORAL | Status: DC | PRN

## 2020-12-11 MED ORDER — LACTATED RINGERS IV BOLUS
500.0000 mL | Freq: Once | INTRAVENOUS | Status: AC
  Administered 2020-12-11: 500 mL via INTRAVENOUS

## 2020-12-11 MED ORDER — POLYETHYLENE GLYCOL 3350 17 G PO PACK: 17.0000 g | PACK | Freq: Every day | ORAL | Status: DC | PRN

## 2020-12-11 MED ORDER — TRANEXAMIC ACID-NACL 1000-0.7 MG/100ML-% IV SOLN
1000.0000 mg | Freq: Once | INTRAVENOUS | Status: AC
  Administered 2020-12-11: 1000 mg via INTRAVENOUS

## 2020-12-11 MED ORDER — PROPOFOL 500 MG/50ML IV EMUL
INTRAVENOUS | Status: DC | PRN
  Administered 2020-12-11: 200 ug/kg/min via INTRAVENOUS

## 2020-12-11 MED ORDER — ASPIRIN 81 MG PO CHEW
81.0000 mg | CHEWABLE_TABLET | Freq: Two times a day (BID) | ORAL | Status: DC
  Administered 2020-12-11 – 2020-12-12 (×2): 81 mg via ORAL
  Filled 2020-12-11 (×2): qty 1

## 2020-12-11 MED ORDER — OXYCODONE HCL 5 MG PO TABS: 5.0000 mg | ORAL_TABLET | ORAL | Status: DC | PRN

## 2020-12-11 MED ORDER — TRANEXAMIC ACID-NACL 1000-0.7 MG/100ML-% IV SOLN
INTRAVENOUS | Status: AC
  Filled 2020-12-11: qty 100

## 2020-12-11 MED ORDER — LACTATED RINGERS IV BOLUS: 250.0000 mL | Freq: Once | INTRAVENOUS | Status: DC

## 2020-12-11 MED ORDER — ONDANSETRON HCL 4 MG/2ML IJ SOLN
INTRAMUSCULAR | Status: DC | PRN
  Administered 2020-12-11: 4 mg via INTRAVENOUS

## 2020-12-11 MED ORDER — METHOCARBAMOL 500 MG PO TABS
500.0000 mg | ORAL_TABLET | Freq: Four times a day (QID) | ORAL | Status: DC | PRN
  Administered 2020-12-11 – 2020-12-12 (×3): 500 mg via ORAL
  Filled 2020-12-11 (×3): qty 1

## 2020-12-11 MED ORDER — PANTOPRAZOLE SODIUM 40 MG PO TBEC
40.0000 mg | DELAYED_RELEASE_TABLET | Freq: Every day | ORAL | Status: DC
  Administered 2020-12-11 – 2020-12-12 (×2): 40 mg via ORAL
  Filled 2020-12-11 (×2): qty 1

## 2020-12-11 MED ORDER — DEXAMETHASONE SODIUM PHOSPHATE 10 MG/ML IJ SOLN
INTRAMUSCULAR | Status: AC
  Filled 2020-12-11: qty 1

## 2020-12-11 MED ORDER — FENTANYL CITRATE (PF) 100 MCG/2ML IJ SOLN
INTRAMUSCULAR | Status: DC | PRN
  Administered 2020-12-11 (×2): 50 ug via INTRAVENOUS
  Administered 2020-12-11: 100 ug via INTRAVENOUS

## 2020-12-11 MED ORDER — HYDROMORPHONE HCL 1 MG/ML IJ SOLN: 0.2500 mg | INTRAMUSCULAR | Status: DC | PRN

## 2020-12-11 MED ORDER — ORAL CARE MOUTH RINSE: 15.0000 mL | Freq: Once | OROMUCOSAL | Status: AC

## 2020-12-11 MED ORDER — ASPIRIN EC 81 MG PO TBEC: 81.0000 mg | DELAYED_RELEASE_TABLET | Freq: Two times a day (BID) | ORAL | 0 refills | Status: DC

## 2020-12-11 MED ORDER — CHLORHEXIDINE GLUCONATE 0.12 % MT SOLN
15.0000 mL | Freq: Once | OROMUCOSAL | Status: AC
  Administered 2020-12-11: 15 mL via OROMUCOSAL

## 2020-12-11 MED ORDER — DEXMEDETOMIDINE (PRECEDEX) IN NS 20 MCG/5ML (4 MCG/ML) IV SYRINGE
PREFILLED_SYRINGE | INTRAVENOUS | Status: AC
  Filled 2020-12-11: qty 10

## 2020-12-11 MED ORDER — ACETAMINOPHEN 500 MG PO TABS
1000.0000 mg | ORAL_TABLET | Freq: Four times a day (QID) | ORAL | Status: DC
  Administered 2020-12-11 – 2020-12-12 (×3): 1000 mg via ORAL
  Filled 2020-12-11 (×3): qty 2

## 2020-12-11 MED ORDER — ONDANSETRON HCL 4 MG/2ML IJ SOLN: 4.0000 mg | Freq: Four times a day (QID) | INTRAMUSCULAR | Status: DC | PRN

## 2020-12-11 MED ORDER — LACTATED RINGERS IV SOLN: INTRAVENOUS | Status: DC

## 2020-12-11 MED ORDER — PHENOL 1.4 % MT LIQD: 1.0000 | OROMUCOSAL | Status: DC | PRN

## 2020-12-11 MED ORDER — MENTHOL 3 MG MT LOZG: 1.0000 | LOZENGE | OROMUCOSAL | Status: DC | PRN

## 2020-12-11 MED ORDER — POVIDONE-IODINE 10 % EX SWAB
2.0000 "application " | Freq: Once | CUTANEOUS | Status: AC
  Administered 2020-12-11: 2 via TOPICAL

## 2020-12-11 MED ORDER — TRANEXAMIC ACID-NACL 1000-0.7 MG/100ML-% IV SOLN
1000.0000 mg | INTRAVENOUS | Status: AC
  Administered 2020-12-11: 1000 mg via INTRAVENOUS
  Filled 2020-12-11: qty 100

## 2020-12-11 MED ORDER — BUPIVACAINE LIPOSOME 1.3 % IJ SUSP
INTRAMUSCULAR | Status: DC | PRN
  Administered 2020-12-11: 10 mL

## 2020-12-11 MED ORDER — ACETAMINOPHEN 325 MG PO TABS
325.0000 mg | ORAL_TABLET | Freq: Four times a day (QID) | ORAL | Status: DC | PRN
Start: 2020-12-12 — End: 2020-12-12

## 2020-12-11 MED ORDER — ONDANSETRON HCL 4 MG/2ML IJ SOLN: 4.0000 mg | Freq: Once | INTRAMUSCULAR | Status: DC | PRN

## 2020-12-11 MED ORDER — DEXMEDETOMIDINE (PRECEDEX) IN NS 20 MCG/5ML (4 MCG/ML) IV SYRINGE
PREFILLED_SYRINGE | INTRAVENOUS | Status: DC | PRN
  Administered 2020-12-11: 12 ug via INTRAVENOUS
  Administered 2020-12-11: 20 ug via INTRAVENOUS

## 2020-12-11 MED ORDER — METOCLOPRAMIDE HCL 5 MG/ML IJ SOLN: 5.0000 mg | Freq: Three times a day (TID) | INTRAMUSCULAR | Status: DC | PRN

## 2020-12-11 MED ORDER — OXYCODONE HCL 5 MG PO TABS
10.0000 mg | ORAL_TABLET | ORAL | Status: DC | PRN
  Administered 2020-12-11 – 2020-12-12 (×4): 15 mg via ORAL
  Filled 2020-12-11 (×4): qty 3

## 2020-12-11 MED ORDER — MIDAZOLAM HCL 5 MG/5ML IJ SOLN
INTRAMUSCULAR | Status: DC | PRN
  Administered 2020-12-11 (×2): 2 mg via INTRAVENOUS

## 2020-12-11 MED ORDER — BUPIVACAINE IN DEXTROSE 0.75-8.25 % IT SOLN
INTRATHECAL | Status: DC | PRN
  Administered 2020-12-11: 1.8 mL via INTRATHECAL

## 2020-12-11 MED ORDER — TIZANIDINE HCL 2 MG PO TABS: 2.0000 mg | ORAL_TABLET | Freq: Four times a day (QID) | ORAL | 0 refills | Status: DC | PRN

## 2020-12-11 MED ORDER — METHOCARBAMOL 1000 MG/10ML IJ SOLN
500.0000 mg | Freq: Four times a day (QID) | INTRAVENOUS | Status: DC | PRN
  Filled 2020-12-11: qty 5

## 2020-12-11 SURGICAL SUPPLY — 46 items
BAG DECANTER FOR FLEXI CONT (MISCELLANEOUS) ×4 IMPLANT
BLADE SAW SGTL 18X1.27X75 (BLADE) ×2 IMPLANT
BLADE SURG SZ10 CARB STEEL (BLADE) ×4 IMPLANT
CNTNR URN SCR LID CUP LEK RST (MISCELLANEOUS) ×1 IMPLANT
CONT SPEC 4OZ STRL OR WHT (MISCELLANEOUS) ×1
COVER PERINEAL POST (MISCELLANEOUS) ×2 IMPLANT
COVER SURGICAL LIGHT HANDLE (MISCELLANEOUS) ×2 IMPLANT
COVER WAND RF STERILE (DRAPES) ×2 IMPLANT
CUP ACETBLR 52 OD 100 SERIES (Hips) ×2 IMPLANT
DECANTER SPIKE VIAL GLASS SM (MISCELLANEOUS) ×4 IMPLANT
DRAPE ORTHO SPLIT 77X108 STRL (DRAPES)
DRAPE STERI IOBAN 125X83 (DRAPES) ×2 IMPLANT
DRAPE SURG ORHT 6 SPLT 77X108 (DRAPES) IMPLANT
DRAPE U-SHAPE 47X51 STRL (DRAPES) ×4 IMPLANT
DRSG AQUACEL AG ADV 3.5X10 (GAUZE/BANDAGES/DRESSINGS) ×2 IMPLANT
DURAPREP 26ML APPLICATOR (WOUND CARE) ×2 IMPLANT
ELECT BLADE TIP CTD 4 INCH (ELECTRODE) ×2 IMPLANT
ELECT REM PT RETURN 15FT ADLT (MISCELLANEOUS) ×2 IMPLANT
ELIMINATOR HOLE APEX DEPUY (Hips) ×2 IMPLANT
GLOVE SRG 8 PF TXTR STRL LF DI (GLOVE) ×1 IMPLANT
GLOVE SURG ENC MOIS LTX SZ7.5 (GLOVE) ×2 IMPLANT
GLOVE SURG ENC MOIS LTX SZ8.5 (GLOVE) ×2 IMPLANT
GLOVE SURG UNDER POLY LF SZ8 (GLOVE) ×1
GLOVE SURG UNDER POLY LF SZ9 (GLOVE) ×2 IMPLANT
GOWN STRL REUS W/TWL XL LVL3 (GOWN DISPOSABLE) ×4 IMPLANT
HEAD CERAMIC DELTA 36 PLUS 1.5 (Hips) ×2 IMPLANT
HOLDER FOLEY CATH W/STRAP (MISCELLANEOUS) ×2 IMPLANT
KIT TURNOVER KIT A (KITS) ×2 IMPLANT
LINER NEUTRAL 52X36MM PLUS 4 (Liner) ×2 IMPLANT
MANIFOLD NEPTUNE II (INSTRUMENTS) ×2 IMPLANT
NEEDLE HYPO 21X1.5 SAFETY (NEEDLE) ×4 IMPLANT
NS IRRIG 1000ML POUR BTL (IV SOLUTION) ×2 IMPLANT
PACK ANTERIOR HIP CUSTOM (KITS) ×2 IMPLANT
PENCIL SMOKE EVACUATOR (MISCELLANEOUS) ×2 IMPLANT
STEM FEMORAL SZ9 HIGH ACTIS (Stem) ×2 IMPLANT
SUT ETHIBOND NAB CT1 #1 30IN (SUTURE) ×2 IMPLANT
SUT VIC AB 0 CT1 27 (SUTURE)
SUT VIC AB 0 CT1 27XBRD ANBCTR (SUTURE) IMPLANT
SUT VIC AB 1 CTX 36 (SUTURE) ×1
SUT VIC AB 1 CTX36XBRD ANBCTR (SUTURE) ×1 IMPLANT
SUT VIC AB 2-0 CT1 27 (SUTURE)
SUT VIC AB 2-0 CT1 TAPERPNT 27 (SUTURE) IMPLANT
SUT VIC AB 3-0 CT1 27 (SUTURE) ×1
SUT VIC AB 3-0 CT1 TAPERPNT 27 (SUTURE) ×1 IMPLANT
SYR CONTROL 10ML LL (SYRINGE) ×6 IMPLANT
TRAY FOLEY MTR SLVR 16FR STAT (SET/KITS/TRAYS/PACK) ×2 IMPLANT

## 2020-12-11 NOTE — Anesthesia Postprocedure Evaluation (Signed)
Anesthesia Post Note  Patient: Keith Alvarado  Procedure(s) Performed: LEFT TOTAL HIP ARTHROPLASTY ANTERIOR APPROACH (Left Hip)     Patient location during evaluation: PACU Anesthesia Type: Spinal Level of consciousness: oriented and awake and alert Pain management: pain level controlled Vital Signs Assessment: post-procedure vital signs reviewed and stable Respiratory status: spontaneous breathing, respiratory function stable and patient connected to nasal cannula oxygen Cardiovascular status: blood pressure returned to baseline and stable Postop Assessment: no headache, no backache and no apparent nausea or vomiting Anesthetic complications: no   No complications documented.  Last Vitals:  Vitals:   12/11/20 1500 12/11/20 1515  BP: (!) 164/93 (!) 158/82  Pulse: 67 68  Resp: 20 17  Temp:    SpO2: 100% 99%    Last Pain:  Vitals:   12/11/20 1515  TempSrc:   PainSc: 0-No pain                 Reine Bristow S

## 2020-12-11 NOTE — Anesthesia Procedure Notes (Signed)
Procedure Name: MAC Date/Time: 12/11/2020 12:33 PM Performed by: Lissa Morales, CRNA Pre-anesthesia Checklist: Patient identified, Emergency Drugs available, Suction available and Patient being monitored Oxygen Delivery Method: Simple face mask Placement Confirmation: positive ETCO2

## 2020-12-11 NOTE — Interval H&P Note (Signed)
History and Physical Interval Note:  12/11/2020 1:57 PM  Keith Alvarado  has presented today for surgery, with the diagnosis of LEFT HIP OSTEOARTHRITIS.  The various methods of treatment have been discussed with the patient and family. After consideration of risks, benefits and other options for treatment, the patient has consented to  Procedure(s): LEFT TOTAL HIP ARTHROPLASTY ANTERIOR APPROACH (Left) as a surgical intervention.  The patient's history has been reviewed, patient examined, no change in status, stable for surgery.  I have reviewed the patient's chart and labs.  Questions were answered to the patient's satisfaction.     Nestor Lewandowsky

## 2020-12-11 NOTE — Anesthesia Procedure Notes (Signed)
Spinal  Patient location during procedure: OR End time: 12/11/2020 12:45 PM Reason for block: surgical anesthesia Staffing Performed: resident/CRNA  Resident/CRNA: Lissa Morales, CRNA Preanesthetic Checklist Completed: patient identified, IV checked, site marked, risks and benefits discussed, surgical consent, monitors and equipment checked, pre-op evaluation and timeout performed Spinal Block Patient position: sitting Prep: DuraPrep Patient monitoring: heart rate, continuous pulse ox and blood pressure Approach: midline Location: L3-4 Injection technique: single-shot Needle Needle type: Pencan  Needle gauge: 24 G Needle length: 9 cm Additional Notes Expiration date of kit checked and confirmed. Patient tolerated procedure well, without complications.

## 2020-12-11 NOTE — Op Note (Signed)
PATIENT ID:      Keith Alvarado  MRN:     [id number] DOB/AGE:    [birth year] / 45 y.o.  OPERATIVE REPORT   DATE OF PROCEDURE:  12/11/2020      PREOPERATIVE DIAGNOSIS:  LEFT HIP OSTEOARTHRITIS                                                         POSTOPERATIVE DIAGNOSIS:  Same                                                         PROCEDURE: Anterior L total hip arthroplasty using a 52 mm DePuy Pinnacle  Cup, [employer], 0-degree polyethylene liner, a +1.5 mm x 21mm ceramic head, a 9 hi Depuy Actis stem  SURGEON: Nestor Lewandowsky  ASSISTANT:   Tomi Likens. [employer]  (present throughout entire procedure and necessary for timely completion of the procedure)   ANESTHESIA: Spinal, Exparel 133mg  injection BLOOD LOSS: 400 cc FLUID REPLACEMENT: 1600 cc crystalloid TRANEXAMIC ACID: 1gm IV, 2gm Topical COMPLICATIONS: none    INDICATIONS FOR PROCEDURE: A 45 y.o. year-old With  LEFT HIP OSTEOARTHRITIS   for 3 years, x-rays show bone-on-bone arthritic changes, and osteophytes. Despite conservative measures with observation, anti-inflammatory medicine, narcotics, use of a cane, has severe unremitting pain and can ambulate only a few blocks before resting. Patient desires elective L total hip arthroplasty to decrease pain and increase function. The risks, benefits, and alternatives were discussed at length including but not limited to the risks of infection, bleeding, nerve injury, stiffness, blood clots, the need for revision surgery, cardiopulmonary complications, among others, and they were willing to proceed. Questions answered      PROCEDURE IN DETAIL: The patient was identified by armband,   received preoperative IV antibiotics in the holding area at [facility], taken to the operating room , appropriate anesthetic monitors   were attached and anesthesia was induced with the patient on the gurney. HANA boots were applied to the feet, and the patient  was transferred to  the HANA table with a peroneal post and support underneath the non-operative leg. Theoperative lower extremity was then prepped and draped in the usual sterile fashion from just above the iliac crest to the knee. And a timeout procedure was performed. [facility]. Tomi Likens [facility] was present and scrubbed throughout the case, critical for assistance with, positioning, exposure, retraction, instrumentation, and closure.Skin along incision area was injected with 10 cc of Exparel solution. We then made a 13 cm incision along the interval at the leading edge of the tensor fascia lata of starting at 2 cm lateral to the ASIS. Small bleeders in the skin and subcutaneous tissue identified and cauterized we dissected down to the fascia and made an incision in the fascia allowing [facility] to elevate the fascia of the tensor muscle and exploited the interval between the rectus and the tensor fascia lata. A Cobra retractor was then placed along the superior neck of the femur. A cerebellar retractor was used to expose the interval between the tensor fascia lata and the rectus femoris.  We identified  and cauterized the ascending branch of the anterior circumflex artery. A second Cobra retractor along the inferior neck of the femur. A small Hohmann retractor was placed underneath the origin of the rectus femoris, giving [place] good medial exposure. Using Ronguers fatty tissue was removed from in front of the anterior capsule. The capsule was then incised, starting out at the superior anterior rim of the acetabulum going laterally along the anterior neck. The capsule was then teed along the neck superiorly and inferiorly. Electrocautery was used to release capsule from the anterior and medial neck of the femur to allow external rotation. Cobra retractors were then placed along the inferior and superior neck allowing [place] to perform a standard neck cut and removed the femoral head with a power corkscrew. We then placed a medium bent homan retractor in  the cotyloid notch and posteriorly along the acetabular rim a narrow Cobra retractor. Exposed labral tissue and osteophytes were then removed. We then sequentially reamed up to a 51 mm basket reamer obtaining good coverage in all quadrants, verified by C-arm imaging. Under C-arm control we then hammered into place a 52 mm Pinnacle cup in 45 of abduction and 15 of anteversion. The cup seated nicely and required no supplemental screws. We then placed a central hole Eliminator and a 0 polyethylene liner. The foot was then externally rotated to 130-140. The limb was extended and adducted to the floor, delivering the proximal femur up into the wound. A medium curved Hohmann retractor was placed over the greater trochanter and a long Homan retractor along the posterior femoral neck completing the exposure and lateralizing the femur. We then performed releases superiorly and and inferiorly of the capsule going back to the pirformis fossa superiorly and to the lesser trochanter inferiorly. We then entered the proximal femur with the box cutting offset chisel followed by, a canal sounder, the chili pepper and broaching up to a 9 broach. This seated nicely and we reamed the calcar. A trial reduction was performed with a 1.5 mm X 36 mm head.The limb lengths were excellent the hip was stable in 90 of external rotation. At this point the trial components removed and we hammered into place a # 9 hi  Offset Actis stem with [employer] coating. A + 1.5 mm x 36 ceramic head was then hammered into place. The hip was reduced and final C-arm images obtained. The wound was thoroughly irrigated with normal saline solution. We repaired the ant capsule and the tensor fascia lot a with running 0 vicryl suture. the subcutaneous tissue was closed with 2-0 and 3-0 Vicryl suture followed by an Aquacil dressing. At this point the patient was awaken and transferred to hospital gurney without difficulty.   Nestor Lewandowsky 12/11/2020, 1:57  PM

## 2020-12-11 NOTE — Anesthesia Preprocedure Evaluation (Signed)
Anesthesia Evaluation  Patient identified by MRN, date of birth, ID band Patient awake    Reviewed: Allergy & Precautions, NPO status , Patient's Chart, lab work & pertinent test results  Airway Mallampati: II  TM Distance: >3 FB Neck ROM: Full    Dental no notable dental hx.    Pulmonary neg pulmonary ROS,    Pulmonary exam normal breath sounds clear to auscultation       Cardiovascular negative cardio ROS Normal cardiovascular exam Rhythm:Regular Rate:Normal     Neuro/Psych negative neurological ROS  negative psych ROS   GI/Hepatic negative GI ROS, Neg liver ROS,   Endo/Other  negative endocrine ROS  Renal/GU negative Renal ROS  negative genitourinary   Musculoskeletal AVN   Abdominal   Peds negative pediatric ROS (+)  Hematology negative hematology ROS (+)   Anesthesia Other Findings   Reproductive/Obstetrics negative OB ROS                             Anesthesia Physical Anesthesia Plan  ASA: II  Anesthesia Plan: Spinal   Post-op Pain Management:    Induction: Intravenous  PONV Risk Score and Plan: 2 and Ondansetron, Dexamethasone, Propofol infusion, Midazolam and Treatment may vary due to age or medical condition  Airway Management Planned: Simple Face Mask  Additional Equipment:   Intra-op Plan:   Post-operative Plan:   Informed Consent: I have reviewed the patients History and Physical, chart, labs and discussed the procedure including the risks, benefits and alternatives for the proposed anesthesia with the patient or authorized representative who has indicated his/her understanding and acceptance.     Dental advisory given  Plan Discussed with: CRNA and Surgeon  Anesthesia Plan Comments:         Anesthesia Quick Evaluation

## 2020-12-11 NOTE — Transfer of Care (Signed)
Immediate Anesthesia Transfer of Care Note  Patient: Keith Alvarado  Procedure(s) Performed: LEFT TOTAL HIP ARTHROPLASTY ANTERIOR APPROACH (Left Hip)  Patient Location: PACU  Anesthesia Type:Spinal  Level of Consciousness: awake, alert , oriented and patient cooperative  Airway & Oxygen Therapy: Patient Spontanous Breathing and Patient connected to face mask oxygen  Post-op Assessment: Report given to RN and Post -op Vital signs reviewed and stable  Post vital signs: stable  Last Vitals:  Vitals Value Taken Time  BP 126/83 12/11/20 1431  Temp    Pulse 84 12/11/20 1437  Resp 12 12/11/20 1437  SpO2 100 % 12/11/20 1437  Vitals shown include unvalidated device data.  Last Pain:  Vitals:   12/11/20 1107  TempSrc: Oral  PainSc:          Complications: No complications documented.

## 2020-12-11 NOTE — Discharge Instructions (Signed)

## 2020-12-12 ENCOUNTER — Encounter (HOSPITAL_COMMUNITY): Payer: Self-pay | Admitting: Orthopedic Surgery

## 2020-12-12 DIAGNOSIS — M1612 Unilateral primary osteoarthritis, left hip: Secondary | ICD-10-CM | POA: Diagnosis not present

## 2020-12-12 LAB — BASIC METABOLIC PANEL
Anion gap: 9 (ref 5–15)
BUN: <5 mg/dL — ABNORMAL LOW (ref 6–20)
CO2: 29 mmol/L (ref 22–32)
Calcium: 9.4 mg/dL (ref 8.9–10.3)
Chloride: 98 mmol/L (ref 98–111)
Creatinine, Ser: 0.74 mg/dL (ref 0.61–1.24)
GFR, Estimated: >60 mL/min (ref >60)
Glucose, Bld: 142 mg/dL — ABNORMAL HIGH (ref 70–99)
Potassium: 3.8 mmol/L (ref 3.5–5.1)
Sodium: 136 mmol/L (ref 135–145)

## 2020-12-12 LAB — CBC
HCT: 43.1 % (ref 39.0–52.0)
Hemoglobin: 14.1 g/dL (ref 13.0–17.0)
MCH: 32 pg (ref 26.0–34.0)
MCHC: 32.7 g/dL (ref 30.0–36.0)
MCV: 98 fL (ref 80.0–100.0)
Platelets: 336 10*3/uL (ref 150–400)
RBC: 4.4 MIL/uL (ref 4.22–5.81)
RDW: 13.2 % (ref 11.5–15.5)
WBC: 17.5 10*3/uL — ABNORMAL HIGH (ref 4.0–10.5)
nRBC: 0 % (ref 0.0–0.2)

## 2020-12-12 NOTE — Progress Notes (Signed)
Patient was given discharge instructions, and all questions were answered.  Patient was stable for discharge and was taken to the main exit by wheelchair. 

## 2020-12-12 NOTE — Evaluation (Signed)
Physical Therapy Evaluation Patient Details Name: Keith Alvarado MRN: [id number] DOB: [birth year] Today's Date: 12/12/2020   History of Present Illness  45 yo male s/p L THA 12/11/20. Hx of R THA 06/2020, AVN  Clinical Impression  On eval, pt was Supv for mobility. He walked ~100 feet with a RW. Moderate pain with activity. Reviewed exercises, gait training, and stair training. Pt is eager to d/c home. All education completed. Okay to d/c from PT standpoint.    Follow Up Recommendations Follow surgeon's recommendation for DC plan and follow-up therapies    Equipment Recommendations  None recommended by PT    Recommendations for Other Services       Precautions / Restrictions Precautions Precautions: Fall Restrictions Weight Bearing Restrictions: No Other Position/Activity Restrictions: WBAT      Mobility  Bed Mobility Overal bed mobility: Needs Assistance Bed Mobility: Supine to Sit     Supine to sit: Supervision;HOB elevated          Transfers Overall transfer level: Needs assistance Equipment used: Rolling walker (2 wheeled) Transfers: Sit to/from Stand Sit to Stand: Supervision            Ambulation/Gait Ambulation/Gait assistance: Min guard Gait Distance (Feet): 100 Feet Assistive device: Rolling walker (2 wheeled) Gait Pattern/deviations: Step-to pattern;Step-through pattern;Decreased stride length;Antalgic     General Gait Details: Min guard for safety. Slow gait speed. Cues for safety, sequence  Stairs            Wheelchair Mobility    Modified Rankin (Stroke Patients Only)       Balance Overall balance assessment: Needs assistance         Standing balance support: Bilateral upper extremity supported Standing balance-Leahy Scale: Fair                               Pertinent Vitals/Pain Pain Assessment: 0-10 Pain Score: 8  Pain Location: L hip/thigh Pain Descriptors / Indicators:  Discomfort;Sore;Grimacing;Guarding Pain Intervention(s): Monitored during session;Ice applied;Repositioned    Home Living Family/patient expects to be discharged to:: Private residence Living Arrangements: Children Available Help at Discharge: Family Type of Home: House Home Access: Stairs to enter Entrance Stairs-Rails: Doctor, general practice of Steps: 7 Home Layout: Two level;Bed/bath upstairs (split level) Home Equipment: Cane - single point;Grab bars - tub/shower;Walker - 2 wheels Additional Comments: pt lives with 32 y.o. son. he reports his father and sister will stay with him for 24 hours.     Prior Function                 Hand Dominance        Extremity/Trunk Assessment   Upper Extremity Assessment Upper Extremity Assessment: Overall WFL for tasks assessed    Lower Extremity Assessment Lower Extremity Assessment:  (post op weakness 2* THA)    Cervical / Trunk Assessment Cervical / Trunk Assessment: Normal  Communication   Communication: No difficulties  Cognition Arousal/Alertness: Awake/alert Behavior During Therapy: WFL for tasks assessed/performed Overall Cognitive Status: Within Functional Limits for tasks assessed                                        General Comments      Exercises Total Joint Exercises Ankle Circles/Pumps: AROM;Both;10 reps Quad Sets: AROM;Both;10 reps Heel Slides: AAROM;Left;10 reps Hip ABduction/ADduction: AAROM;Left;10 reps   Assessment/Plan  PT Assessment Patient needs continued PT services  PT Problem List Decreased strength;Decreased range of motion;Decreased mobility;Decreased activity tolerance;Decreased balance;Decreased knowledge of use of DME;Pain       PT Treatment Interventions DME instruction;Gait training;Therapeutic exercise;Balance training;Functional mobility training;Therapeutic activities;Patient/family education;Stair training    PT Goals (Current goals can be found  in the Care Plan section)  Acute Rehab PT Goals Patient Stated Goal: regain independence/get back to work PT Goal Formulation: With patient Time For Goal Achievement: 12/26/20 Potential to Achieve Goals: Good    Frequency 7X/week   Barriers to discharge        Co-evaluation               AM-PAC PT "6 Clicks" Mobility  Outcome Measure Help needed turning from your back to your side while in a flat bed without using bedrails?: A Little Help needed moving from lying on your back to sitting on the side of a flat bed without using bedrails?: A Little Help needed moving to and from a bed to a chair (including a wheelchair)?: A Little Help needed standing up from a chair using your arms (e.g., wheelchair or bedside chair)?: A Little Help needed to walk in hospital room?: A Little Help needed climbing 3-5 steps with a railing? : A Little 6 Click Score: 18    End of Session Equipment Utilized During Treatment: Gait belt Activity Tolerance: Patient tolerated treatment well;Patient limited by pain Patient left: in chair;with call bell/phone within reach   PT Visit Diagnosis: Pain;Other abnormalities of gait and mobility (R26.89) Pain - Right/Left: Left Pain - part of body: Hip    Time: 4403-4742 PT Time Calculation (min) (ACUTE ONLY): 28 min   Charges:   PT Evaluation $PT Eval Low Complexity: 1 Low PT Treatments $Gait Training: 8-22 mins          Faye Ramsay, PT Acute Rehabilitation  Office: [phone] Pager: [phone]

## 2020-12-12 NOTE — TOC Transition Note (Signed)
Transition of Care [facility]) - CM/SW Discharge Note   Patient Details  Name: Keith Alvarado MRN: [id number] Date of Birth: 01-13-1976  Transition of Care [facility]) CM/SW Contact:  Lennart Pall, LCSW Phone Number: 12/12/2020, 12:24 PM   Clinical Narrative:    Met briefly with pt and confirming he has all needed DME at home.  Plan for [facility] via [place] ([facility]  No further TOC needs.   Final next level of care: [place] Barriers to Discharge: No Barriers Identified   Patient Goals and CMS Choice Patient states their goals for this hospitalization and ongoing recovery are:: return home      Discharge Placement                       Discharge Plan and Services                DME Arranged: N/A DME Agency: NA       HH Arranged: PT HH Agency: [facility] ([facility] [employer] Date [place]:  (arranged prior to surgery)      Social Determinants of Health (SDOH) Interventions     Readmission Risk Interventions No flowsheet data found.

## 2020-12-12 NOTE — Discharge Summary (Signed)
Patient ID: Keith Alvarado MRN: [id number] DOB/AGE: [birth year] 45 y.o.  Admit date: 12/11/2020 Discharge date: 12/12/2020  Admission Diagnoses:  Principal Problem:   AVN (avascular necrosis of bone) (HCC) Active Problems:   S/P hip replacement, left   Discharge Diagnoses:  Same  Past Medical History:  Diagnosis Date  . Medical history non-contributory     Surgeries: Procedure(s): LEFT TOTAL HIP ARTHROPLASTY ANTERIOR APPROACH on 12/11/2020   Consultants:   Discharged Condition: Improved  Hospital Course: Keith Alvarado Imran is an 45 y.o. male who was admitted 12/11/2020 for operative treatment ofAVN (avascular necrosis of bone) (HCC). Patient has severe unremitting pain that affects sleep, daily activities, and work/hobbies. After pre-op clearance the patient was taken to the operating room on 12/11/2020 and underwent  Procedure(s): LEFT TOTAL HIP ARTHROPLASTY ANTERIOR APPROACH.    Patient was given perioperative antibiotics:  Anti-infectives (From admission, onward)   Start     Dose/Rate Route Frequency Ordered Stop   12/11/20 1045  ceFAZolin (ANCEF) IVPB 2g/100 mL premix        2 g 200 mL/hr over 30 Minutes Intravenous On call to O.R. 12/11/20 1038 12/11/20 1236       Patient was given sequential compression devices, early ambulation, and chemoprophylaxis to prevent DVT.  Patient benefited maximally from hospital stay and there were no complications.    Recent vital signs:  Patient Vitals for the past 24 hrs:  BP Temp Temp src Pulse Resp SpO2 Height Weight  12/12/20 0514 (Abnormal) 146/92 98.7 F (37.1 C) Oral 84 18 100 % no documentation no documentation  12/12/20 0143 (Abnormal) 154/99 98.4 F (36.9 C) Oral 77 18 99 % no documentation no documentation  12/11/20 2303 (Abnormal) 150/103 99.2 F (37.3 C) Oral 82 18 97 % no documentation no documentation  12/11/20 2128 (Abnormal) 143/99 98.6 F (37 C) Oral 84 18 97 % no documentation no documentation   12/11/20 2114 no documentation no documentation no documentation no documentation no documentation no documentation 6' (1.829 m) 72.6 kg  12/11/20 2026 (Abnormal) 149/93 99 F (37.2 C) Oral 96 18 97 % no documentation no documentation  12/11/20 1925 (Abnormal) 138/99 99.2 F (37.3 C) Oral 84 18 98 % no documentation no documentation  12/11/20 1855 (Abnormal) 141/90 no documentation no documentation 83 (Abnormal) 22 97 % no documentation no documentation  12/11/20 1845 no documentation no documentation no documentation no documentation no documentation 100 % no documentation no documentation  12/11/20 1830 (Abnormal) 146/98 98.4 F (36.9 C) no documentation 78 19 100 % no documentation no documentation  12/11/20 1800 (Abnormal) 160/89 no documentation no documentation no documentation 18 99 % no documentation no documentation  12/11/20 1730 (Abnormal) 155/95 no documentation no documentation no documentation 19 98 % no documentation no documentation  12/11/20 1700 (Abnormal) 153/92 no documentation no documentation no documentation 17 100 % no documentation no documentation  12/11/20 1630 (Abnormal) 149/90 no documentation no documentation no documentation 20 100 % no documentation no documentation  12/11/20 1600 (Abnormal) 148/94 no documentation no documentation no documentation 16 99 % no documentation no documentation  12/11/20 1528 (Abnormal) 154/93 (Abnormal) 97.4 F (36.3 C) no documentation 68 (Abnormal) 21 99 % no documentation no documentation  12/11/20 1515 (Abnormal) 158/82 (Abnormal) 97.5 F (36.4 C) no documentation 68 17 99 % no documentation no documentation  12/11/20 1500 (Abnormal) 164/93 no documentation no documentation 67 20 100 % no documentation no documentation  12/11/20 1445 (Abnormal) 165/87 no documentation no documentation 89 (Abnormal) 22 97 %  no documentation no documentation  12/11/20 1432 126/83 (Abnormal) 97.5 F (36.4 C) no documentation 80 15 100 % no  documentation no documentation  12/11/20 1107 (Abnormal) 169/98 99.1 F (37.3 C) Oral 94 18 97 % no documentation no documentation  12/11/20 1057 no documentation no documentation no documentation no documentation no documentation no documentation no documentation 71.2 kg     Recent laboratory studies:  Recent Labs    12/12/20 0437  WBC 17.5*  HGB 14.1  HCT 43.1  PLT 336  NA 136  K 3.8  CL 98  CO2 29  BUN <5*  CREATININE 0.74  GLUCOSE 142*  CALCIUM 9.4     Discharge Medications:   Allergies as of 12/12/2020   No Known Allergies     Medication List    Stop taking these medications   traMADol 50 MG tablet Commonly known as: ULTRAM     Take these medications   aspirin EC 81 MG tablet Take 1 tablet (81 mg total) by mouth 2 (two) times daily.   multivitamin with minerals Tabs tablet Take 1 tablet by mouth daily. Centrum   OVER THE COUNTER MEDICATION Take 2 capsules by mouth daily. [place]   oxyCODONE-acetaminophen 5-325 MG tablet Commonly known as: PERCOCET/ROXICET Take 1-2 tablets by mouth every 4 (four) hours as needed for severe pain. What changed: how much to take   tiZANidine 2 MG tablet Commonly known as: ZANAFLEX Take 1 tablet (2 mg total) by mouth every 6 (six) hours as needed.        Durable Medical Equipment  (From admission, onward)         Start     Ordered   12/11/20 1912  DME Walker rolling  Once       Question:  Patient needs a walker to treat with the following condition  Answer:  Status post total hip replacement, left   12/11/20 1911   12/11/20 1912  DME 3 n 1  Once        12/11/20 1911           Discharge Care Instructions  (From admission, onward)         Start     Ordered   12/12/20 0000  Change dressing       Comments: Change dressing Only if drainage exceeds 40% of window on dressing   12/12/20 0739          Diagnostic Studies: DG Chest 2 View  Result Date: 12/01/2020 CLINICAL DATA:  45 year old male with a  history of upcoming surgery EXAM: CHEST - 2 VIEW COMPARISON:  07/05/2020 FINDINGS: Cardiomediastinal silhouette unchanged in size and contour. No evidence of central vascular congestion. No pneumothorax or pleural effusion. No confluent airspace disease. Scoliotic curvature of the spine is unchanged. No acute displaced fracture. IMPRESSION: No active cardiopulmonary disease. Electronically Signed   By: Gilmer Mor D.O.   On: 12/01/2020 09:24   DG C-Arm 1-60 Min-No Report  Result Date: 12/11/2020 Fluoroscopy was utilized by the requesting physician.  No radiographic interpretation.   DG HIP OPERATIVE UNILAT W OR W/O PELVIS LEFT  Result Date: 12/11/2020 CLINICAL DATA:  Elective surgery. EXAM: OPERATIVE LEFT HIP (WITH PELVIS IF PERFORMED) TECHNIQUE: Fluoroscopic spot image(s) were submitted for interpretation post-operatively. COMPARISON:  None. FINDINGS: Two fluoroscopic spot views of the pelvis and left hip obtained in the operating room in frontal projection. Left hip arthroplasty in expected alignment. Total fluoroscopy time 8 seconds. IMPRESSION: Procedural fluoroscopy for left hip arthroplasty.  Electronically Signed   By: Narda Rutherford M.D.   On: 12/11/2020 15:02    Disposition: Discharge disposition: 01-Home or Self Care       Discharge Instructions    Call MD / Call 911   Complete by: As directed    If you experience chest pain or shortness of breath, CALL 911 and be transported to the hospital emergency room.  If you develope a fever above 101 F, pus (white drainage) or increased drainage or redness at the wound, or calf pain, call your surgeon's office.   Change dressing   Complete by: As directed    Change dressing Only if drainage exceeds 40% of window on dressing   Constipation Prevention   Complete by: As directed    Drink plenty of fluids.  Prune juice may be helpful.  You may use a stool softener, such as Colace (over the counter) 100 mg twice a day.  Use MiraLax (over  the counter) for constipation as needed.   Diet - low sodium heart healthy   Complete by: As directed    Increase activity slowly as tolerated   Complete by: As directed    Post-operative opioid taper instructions:   Complete by: As directed    POST-OPERATIVE OPIOID TAPER INSTRUCTIONS: It is important to wean off of your opioid medication as soon as possible. If you do not need pain medication after your surgery it is ok to stop day one. Opioids include: Codeine, Hydrocodone(Norco, Vicodin), Oxycodone(Percocet, oxycontin) and hydromorphone amongst others.  Long term and even short term use of opiods can cause: Increased pain response Dependence Constipation Depression Respiratory depression And more.  Withdrawal symptoms can include Flu like symptoms Nausea, vomiting And more Techniques to manage these symptoms Hydrate well Eat regular healthy meals Stay active Use relaxation techniques(deep breathing, meditating, yoga) Do Not substitute Alcohol to help with tapering If you have been on opioids for less than two weeks and do not have pain than it is ok to stop all together.  Plan to wean off of opioids This plan should start within one week post op of your joint replacement. Maintain the same interval or time between taking each dose and first decrease the dose.  Cut the total daily intake of opioids by one tablet each day Next start to increase the time between doses. The last dose that should be eliminated is the evening dose.          Follow-up Information    Gean Birchwood, MD In 2 weeks.   Specialty: Orthopedic Surgery Contact information: [address] [place] [address] [phone]                Signed: Nestor Lewandowsky 12/12/2020, 7:39 AM

## 2020-12-12 NOTE — Progress Notes (Signed)
PATIENT ID: Keith Alvarado  MRN: [id number]  DOB/AGE:  August 08, 1976 / 45 y.o.  1 Day Post-Op Procedure(s) (LRB): LEFT TOTAL HIP ARTHROPLASTY ANTERIOR APPROACH (Left)    PROGRESS NOTE Subjective: Patient is alert, oriented, no Nausea, no Vomiting, yes passing gas, . Taking PO well. Denies SOB, Chest or Calf Pain. Using Incentive Spirometer, PAS in place. Ambulate WBAT Patient reports pain as  3/10  .    Objective: Vital signs in last 24 hours: Vitals:   12/11/20 2128 12/11/20 2303 12/12/20 0143 12/12/20 0514  BP: (Abnormal) 143/99 (Abnormal) 150/103 (Abnormal) 154/99 (Abnormal) 146/92  Pulse: 84 82 77 84  Resp: 18 18 18 18   Temp: 98.6 F (37 C) F (37.3 C) 98.4 F (36.9 C) 98.7 F (37.1 C)  TempSrc: Oral Oral Oral Oral  SpO2: 97% 97% 99% 100%  Weight:      Height:          Intake/Output from previous day: I/O last 3 completed shifts: In: 4483.1 [P.O.:1200; I.V.:3283.1] Out: 6350 [Urine:6000; Blood:350]   Intake/Output this shift: No intake/output data recorded.   LABORATORY DATA: Recent Labs    12/12/20 0437  WBC 17.5*  HGB 14.1  HCT 43.1  PLT 336  NA 136  K 3.8  CL 98  CO2 29  BUN <5*  CREATININE 0.74  GLUCOSE 142*  CALCIUM 9.4    Examination: Neurologically intact ABD soft Neurovascular intact Sensation intact distally Intact pulses distally Dorsiflexion/Plantar flexion intact Incision: dressing C/D/I No cellulitis present Compartment soft} XR AP&Lat of hip shows well placed\fixed THA  Assessment:   1 Day Post-Op Procedure(s) (LRB): LEFT TOTAL HIP ARTHROPLASTY ANTERIOR APPROACH (Left) ADDITIONAL DIAGNOSIS:  Expected Acute Blood Loss Anemia,   Patient's anticipated LOS is less than 2 midnights, meeting these requirements: - Younger than 43 - Lives within 1 hour of care - Has a competent adult at home to recover with post-op recover - NO history of  - Chronic pain requiring opiods  - Diabetes  - Coronary Artery Disease  - Heart  failure  - Heart attack  - Stroke  - DVT/VTE  - Cardiac arrhythmia  - Respiratory Failure/COPD  - Renal failure  - Anemia  - Advanced Liver disease       Plan: PT/OT WBAT, THA  DVT Prophylaxis: SCDx72 hrs, ASA 81 mg BID x 2 weeks  DISCHARGE PLAN: Home , today if passes PT DISCHARGE NEEDS: HHPT, Walker and 3-in-1 comode seatPatient ID: Keith Alvarado, male   DOB: September 03, 1975, 45 y.o.   MRN: [id number]

## 2021-01-16 ENCOUNTER — Encounter: Payer: Self-pay | Admitting: Physical Therapy

## 2021-01-16 ENCOUNTER — Ambulatory Visit: Payer: Medicaid Other | Attending: Orthopedic Surgery | Admitting: Physical Therapy

## 2021-01-16 ENCOUNTER — Other Ambulatory Visit: Payer: Self-pay

## 2021-01-16 DIAGNOSIS — M6281 Muscle weakness (generalized): Secondary | ICD-10-CM | POA: Diagnosis present

## 2021-01-16 DIAGNOSIS — M25652 Stiffness of left hip, not elsewhere classified: Secondary | ICD-10-CM | POA: Insufficient documentation

## 2021-01-16 DIAGNOSIS — R2689 Other abnormalities of gait and mobility: Secondary | ICD-10-CM | POA: Diagnosis present

## 2021-01-16 DIAGNOSIS — M25552 Pain in left hip: Secondary | ICD-10-CM | POA: Diagnosis present

## 2021-01-16 NOTE — Patient Instructions (Signed)
Access Code: [email or web address] URL: [email or web address] Date: 01/16/2021 Prepared by: Rosana Hoes  Exercises Hooklying Single Knee to Chest Stretch - 2-3 x daily - 7 x weekly - 2 reps - 30 hold Prone Quadriceps Stretch with Strap - 2-3 x daily - 7 x weekly - 2 sets - 2 reps - 30 hold Supine Quadriceps Stretch with Strap on Table - 2-3 x daily - 7 x weekly - 2 reps - 30 hold Seated Hamstring Stretch - 2-3 x daily - 7 x weekly - 2 reps - 30 hold Clamshell with Resistance - 1 x daily - 7 x weekly - 3 sets - 10 reps Supine Bridge - 1 x daily - 7 x weekly - 3 sets - 10 reps - 5 hold Sit to Stand Without Arm Support - 1 x daily - 7 x weekly - 3 sets - 10 reps

## 2021-01-17 ENCOUNTER — Encounter: Payer: Self-pay | Admitting: Physical Therapy

## 2021-01-17 NOTE — Therapy (Addendum)
[facility] Outpatient Rehabilitation [facility] [address] [place], [place], [address] Phone: [phone]   Fax:  [phone]  Physical Therapy Evaluation  Patient Details  Name: Keith Alvarado MRN: [id number] Date of Birth: 1975-09-04 Referring Provider (PT): Gean Birchwood, MD   Encounter Date: 01/16/2021   PT End of Session - 01/16/21 1659    Visit Number 1    Number of Visits 8    Date for PT Re-Evaluation 03/13/21    Authorization Type [employer]    PT Start Time 1620    PT Stop Time 1700    PT Time Calculation (min) 40 min    Activity Tolerance Patient tolerated treatment well    Behavior During Therapy [facility] for tasks assessed/performed           Past Medical History:  Diagnosis Date  . Medical history non-contributory     Past Surgical History:  Procedure Laterality Date  . FOOT SURGERY Left   . JOINT REPLACEMENT Right 2021  . TOTAL HIP ARTHROPLASTY Right 07/17/2020   Procedure: RIGHT TOTAL HIP ARTHROPLASTY ANTERIOR APPROACH;  Surgeon: Gean Birchwood, MD;  Location: [facility];  Service: Orthopedics;  Laterality: Right;  . TOTAL HIP ARTHROPLASTY Left 12/11/2020   Procedure: LEFT TOTAL HIP ARTHROPLASTY ANTERIOR APPROACH;  Surgeon: Gean Birchwood, MD;  Location: [facility];  Service: Orthopedics;  Laterality: Left;    There were no vitals filed for this visit.    Subjective Assessment - 01/16/21 1623    Subjective Patient reports left THA in April 25th 2022, he also had previous right THA in November 2021. Still getting some soreness with activity but overall is doing well. States his range of motion is improving. Patient is using SPC in right hand mainly for community ambulation but also with stairs in the house. He did have HHPT and he is still doing those exercises.    Pertinent History Bilateral THA anterior approach    Limitations Sitting;Lifting;Standing;Walking;House hold activities    How long can you sit comfortably? no limitation     How long can you stand comfortably? "not long"    How long can you walk comfortably? "not long"    Patient Stated Goals Get back to golf and kayaking    Currently in Pain? Yes    Pain Score 0-No pain   5-6/10 at worse   Pain Location Hip    Pain Orientation Left    Pain Descriptors / Indicators Aching    Pain Type Surgical pain    Pain Radiating Towards anterior thigh    Pain Onset More than a month ago    Pain Frequency Intermittent    Aggravating Factors  Movement, increased activites    Pain Relieving Factors Rest, occasionally ice/heat              [facility] PT Assessment - 01/17/21 0001      Assessment   Medical Diagnosis S/P L total hip arthoplasty    Referring Provider (PT) Gean Birchwood, MD    Onset Date/Surgical Date 12/11/20    Next MD Visit Unsure    Prior Therapy Yes      Precautions   Precautions None      Restrictions   Weight Bearing Restrictions No      Balance Screen   Has the patient fallen in the past 6 months No    Has the patient had a decrease in activity level because of a fear of falling?  No    Is the  patient reluctant to leave their home because of a fear of falling?  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   Overall Cognitive Status Within Functional Limits for tasks assessed      Observation/Other Assessments   Observations Patient    Focus on Therapeutic Outcomes (FOTO)  NA - MCD      Sensation   Light Touch Appears Intact      Coordination   Gross Motor Movements are Fluid and Coordinated Yes      Functional Tests   Functional tests Single leg stance;Sit to Stand       Single Leg Stance   Comments Patient able to maintain SLS < 3 sec bilaterally      Sit to Stand   Comments Patient requires increased forward trunk lean for momentum to perform sit<>stand without UE support, slight weight shift toward right      ROM / Strength   AROM / PROM / Strength AROM;PROM;Strength      AROM   Overall AROM Comments Lumbar and knee AROM grossly WFL    AROM Assessment Site Hip    Right/Left Hip Left    Left Hip Extension -10   lacking from neutral   Left Hip Flexion 90      PROM   PROM Assessment Site Hip    Right/Left Hip Left    Left Hip Extension -10   lacking from neutral   Left Hip Flexion 95    Left Hip External Rotation  30    Left Hip Internal Rotation  25    Left Hip ABduction 30      Strength   Strength Assessment Site Hip;Knee    Right/Left Hip Right;Left    Right Hip Flexion 4/5    Right Hip Extension 4-/5    Right Hip ABduction 4-/5    Left Hip Flexion 4-/5    Left Hip Extension 3+/5    Left Hip ABduction 3/5    Right/Left Knee Right;Left    Right Knee Flexion 5/5    Right Knee Extension 5/5    Left Knee Flexion 4+/5    Left Knee Extension 4+/5      Flexibility   Soft Tissue Assessment /Muscle Length yes    Hamstrings Limited bilaterally    Quadriceps Limited bilaterally, [place] ~90 deg on left      Transfers   Transfers Independent with all Transfers      Ambulation/Gait   Ambulation/Gait Yes    Ambulation/Gait Assistance 6: Modified independent (Device/Increase time)    Assistive device Straight cane   right hand   Gait Comments Slightly antalgic on left with decreased gait speed                      Objective measurements completed on examination: See above findings.       [facility] Adult PT Treatment/Exercise - 01/17/21 0001      Exercises   Exercises Knee/Hip      Knee/Hip Exercises:  Stretches   Passive Hamstring Stretch 2 reps;30 seconds    Passive Hamstring Stretch Limitations seated edge of chair     Quad Stretch 2 reps;30 seconds    Quad Stretch Limitations prone with strap    Hip Flexor Stretch 2 reps;30 seconds    Hip Flexor Stretch Limitations modified thomas edge of mat with strap    Other Knee/Hip Stretches Single knee to chest 2 x 20 sec      Knee/Hip Exercises: Seated   Sit to [employer] 10 reps;without UE support      Knee/Hip Exercises: Supine   Bridges 10 reps   5 sec hold   Straight Leg Raises 10 reps      Knee/Hip Exercises: Sidelying   Clams x 10 with yellow                  PT Education - 01/16/21 1659    Education Details Exam findings, POC, HEP    Person(s) Educated Patient    Methods Explanation;Demonstration;Tactile cues;Verbal cues;Handout    Comprehension Verbalized understanding;Returned demonstration;Verbal cues required;Tactile cues required;Need further instruction            PT Short Term Goals - 01/17/21 0843      PT SHORT TERM GOAL #1   Title He wil be independent with initial HEP to progress with PT    Baseline HEP provided at eval    Time 4    Period Weeks    Status New    Target Date 02/13/21      PT SHORT TERM GOAL #2   Title Patient will ambulate in community without device to improve community access and shopping    Baseline using SPC in community    Time 4    Period Weeks    Status New    Target Date 02/13/21      PT SHORT TERM GOAL #3   Title Patient will achieve neutral hip extension to improve gait and functional mobility    Baseline -10 deg    Time 4    Period Weeks    Status New    Target Date 02/13/21             PT Long Term Goals - 01/17/21 0845      PT LONG TERM GOAL #1   Title He will be independent with final HEP issued to maintain progress from PT    Baseline HEP rpovided at eval    Time 8    Period Weeks    Status New    Target Date 03/13/21      PT LONG TERM GOAL #2   Title Patient will report no limitations with walking or standing duration to improve ability to return to work    Baseline  patient reports limitations with standing and walking "not long"    Time 8    Period Weeks    Status New    Target Date 03/13/21      PT LONG TERM GOAL #3   Title Patient will demonstrate left hip ROM grossly WFL in order to return to kayaking    Baseline patient exhibits limitations in all planes of left hip motion    Time 8    Period Weeks    Status New    Target Date 03/13/21      PT LONG TERM GOAL #4   Title Patient will demonstrate left hip strength >/= 4+/5 MMT in order to return to golfing tasks  Baseline left hip strength gross 3+/5 - 4-/5 MMT    Time 8    Period Weeks    Status New    Target Date 03/13/21                  Plan - 01/17/21 0837    Clinical Impression Statement Patient presents to PT following left TKA anterior approach on 12/11/20. Currently he exhibits limitation in left motion and strength that are expected post-surgery. He demonstrates slight antalgic gait on left while using SPC in right hand. Pain is well controlled at this point and he is progressing with activity level. Patient provided exercises to initiate stretching for left hip and strengthening of left hip and LEs. He would benefit from continued skilled PT to progress mobility and strength in order to reduce pain with activity and maximize his functional ability.    Personal Factors and Comorbidities Past/Current Experience    Examination-Activity Limitations Locomotion Level;Squat;Stairs;Stand;Lift    Examination-Participation Restrictions Meal Prep;Cleaning;Occupation;Community Activity;Driving;Shop;Laundry;Yard Work    Stability/Clinical Decision Making Stable/Uncomplicated    OptometristClinical Decision Making Low    Rehab Potential Good    PT Frequency 1x / week    PT Duration 8 weeks    PT Treatment/Interventions ADLs/Self Care Home Management;Aquatic Therapy;Cryotherapy;Electrical Stimulation;Iontophoresis 4mg /ml Dexamethasone;Moist Heat;Traction;Ultrasound;Neuromuscular  re-education;Balance training;Therapeutic exercise;Therapeutic activities;Functional mobility training;Stair training;Gait training;Patient/family education;Manual techniques;Dry needling;Passive range of motion;Taping;Spinal Manipulations;Joint Manipulations;Vasopneumatic Device    PT Next Visit Plan Review HEP and progress PRN, stretching for hip motion, progress strengthening as tolerated, progress to standing exercise    PT Home Exercise Plan [email or web address]    Consulted and Agree with Plan of Care Patient           Patient will benefit from skilled therapeutic intervention in order to improve the following deficits and impairments:  Abnormal gait,Decreased range of motion,Difficulty walking,Pain,Decreased activity tolerance,Decreased balance,Impaired flexibility,Improper body mechanics,Decreased strength  Visit Diagnosis: Pain in left hip  Stiffness of left hip, not elsewhere classified  Muscle weakness (generalized)  Other abnormalities of gait and mobility     Problem List Patient Active Problem List   Diagnosis Date Noted  . S/P hip replacement, left 12/11/2020  . AVN (avascular necrosis of bone) (HCC) 12/06/2020  . AVN of femur (HCC) 07/07/2020    Rosana Hoesampbell Ola Fawver, PT, DPT, LAT, ATC 01/17/21  8:58 AM Phone: [phone] Fax: [phone]   [facility] [address] [place], [place], [address] Phone: [phone]   Fax:  [phone]  Name: Keith Alvarado MRN: [id number] Date of Birth: 1975-09-29   Check all possible CPT codes: 97110- Therapeutic Exercise, [phone]- Neuro Re-education, [phone] - Gait Training, [phone] - Manual Therapy, 97530 - Therapeutic Activities, [phone] - Self Care and [phone] - Aquatic therapy

## 2021-01-26 ENCOUNTER — Ambulatory Visit: Payer: Medicaid Other | Attending: Orthopedic Surgery | Admitting: Rehabilitative and Restorative Service Providers"

## 2021-01-26 ENCOUNTER — Other Ambulatory Visit: Payer: Self-pay

## 2021-01-26 DIAGNOSIS — M25652 Stiffness of left hip, not elsewhere classified: Secondary | ICD-10-CM | POA: Insufficient documentation

## 2021-01-26 DIAGNOSIS — R2689 Other abnormalities of gait and mobility: Secondary | ICD-10-CM | POA: Insufficient documentation

## 2021-01-26 DIAGNOSIS — M25552 Pain in left hip: Secondary | ICD-10-CM | POA: Insufficient documentation

## 2021-01-26 DIAGNOSIS — M6281 Muscle weakness (generalized): Secondary | ICD-10-CM | POA: Insufficient documentation

## 2021-02-07 ENCOUNTER — Ambulatory Visit: Payer: Medicaid Other | Admitting: Physical Therapy

## 2021-02-07 ENCOUNTER — Encounter: Payer: Self-pay | Admitting: Physical Therapy

## 2021-02-07 ENCOUNTER — Other Ambulatory Visit: Payer: Self-pay

## 2021-02-07 DIAGNOSIS — M25552 Pain in left hip: Secondary | ICD-10-CM

## 2021-02-07 DIAGNOSIS — M25652 Stiffness of left hip, not elsewhere classified: Secondary | ICD-10-CM

## 2021-02-07 DIAGNOSIS — R2689 Other abnormalities of gait and mobility: Secondary | ICD-10-CM

## 2021-02-07 DIAGNOSIS — M6281 Muscle weakness (generalized): Secondary | ICD-10-CM | POA: Diagnosis present

## 2021-02-07 NOTE — Therapy (Signed)
[facility] Outpatient Rehabilitation [facility] [address] [place], [place], [address] Phone: [phone]   Fax:  [phone]  Physical Therapy Treatment  Patient Details  Name: Keith Alvarado MRN: [id number] Date of Birth: January 18, 1976 Referring Provider (PT): Gean Birchwood, MD   Encounter Date: 02/07/2021   PT End of Session - 02/07/21 1409     Visit Number 2    Number of Visits 8    Date for PT Re-Evaluation 03/13/21    Authorization Type [employer]    Authorization Time Period 01/26/21 - 03/18/21    Authorization - Visit Number 1    Authorization - Number of Visits 8    PT Start Time 1409    PT Stop Time 1447    PT Time Calculation (min) 38 min    Activity Tolerance Patient tolerated treatment well    Behavior During Therapy [facility] for tasks assessed/performed             Past Medical History:  Diagnosis Date   Medical history non-contributory     Past Surgical History:  Procedure Laterality Date   FOOT SURGERY Left    JOINT REPLACEMENT Right 2021   TOTAL HIP ARTHROPLASTY Right 07/17/2020   Procedure: RIGHT TOTAL HIP ARTHROPLASTY ANTERIOR APPROACH;  Surgeon: Gean Birchwood, MD;  Location: [facility];  Service: Orthopedics;  Laterality: Right;   TOTAL HIP ARTHROPLASTY Left 12/11/2020   Procedure: LEFT TOTAL HIP ARTHROPLASTY ANTERIOR APPROACH;  Surgeon: Gean Birchwood, MD;  Location: [facility];  Service: Orthopedics;  Laterality: Left;    There were no vitals filed for this visit.   Subjective Assessment - 02/07/21 1414     Subjective Patient reports hip is feeling good for the most part, will have an occasional day with pain. He hasn't been doing the exercises much.    Patient Stated Goals Get back to golf and kayaking    Currently in Pain? Yes    Pain Score 3     Pain Location Hip    Pain Orientation Left    Pain Descriptors / Indicators Tightness    Pain Type Surgical pain    Pain Onset More than a month ago    Pain Frequency  Intermittent                [facility] PT Assessment - 02/07/21 0001       AROM   Left Hip Extension 0   neutral     Ambulation/Gait   Ambulation/Gait Yes    Ambulation/Gait Assistance 7: Independent    Gait Comments Antalgic on left                           [facility] Adult PT Treatment/Exercise - 02/07/21 0001       Exercises   Exercises Knee/Hip      Knee/Hip Exercises: Stretches   Hip Flexor Stretch 3 reps;30 seconds    Hip Flexor Stretch Limitations modified thomas PROM      Knee/Hip Exercises: Aerobic   Nustep L6 x 5 min with LE/UE while taking subjective      Knee/Hip Exercises: Machines for Strengthening   Total Gym Leg Press 45# 2 x 10      Knee/Hip Exercises: Standing   Heel Raises 2 sets;20 reps    Hip Abduction 2 sets;10 reps    Hip Extension 2 sets;10 reps    Forward Step Up 2 sets;10 reps    Forward Step Up Limitations 6"  step-height    Functional Squat 2 sets;10 reps    Functional Squat Limitations holding on to FM bar      Knee/Hip Exercises: Supine   Bridges 10 reps   2 sets   Bridges Limitations green band around knees    Other Supine Knee/Hip Exercises Clamshell with green 2 x 15    Other Supine Knee/Hip Exercises Marching with green 2 x 20                    PT Education - 02/07/21 1408     Education Details HEP and consistency    Person(s) Educated Patient    Methods Explanation;Demonstration;Verbal cues    Comprehension Verbalized understanding;Returned demonstration;Verbal cues required;Need further instruction              PT Short Term Goals - 02/07/21 1438       PT SHORT TERM GOAL #1   Title He wil be independent with initial HEP to progress with PT    Baseline patient inconsistent with exercises    Time 4    Period Weeks    Status On-going    Target Date 02/13/21      PT SHORT TERM GOAL #2   Title Patient will ambulate in community without device to improve community access and shopping     Baseline patient ambulating without AD, continues to exhibit antalgic gait on left    Time 4    Period Weeks    Status On-going    Target Date 02/13/21      PT SHORT TERM GOAL #3   Title Patient will achieve neutral hip extension to improve gait and functional mobility    Baseline 0 deg (neutral)    Time 4    Period Weeks    Status Achieved    Target Date 02/13/21               PT Long Term Goals - 01/17/21 0845       PT LONG TERM GOAL #1   Title He will be independent with final HEP issued to maintain progress from PT    Baseline HEP rpovided at eval    Time 8    Period Weeks    Status New    Target Date 03/13/21      PT LONG TERM GOAL #2   Title Patient will report no limitations with walking or standing duration to improve ability to return to work    Baseline patient reports limitations with standing and walking "not long"    Time 8    Period Weeks    Status New    Target Date 03/13/21      PT LONG TERM GOAL #3   Title Patient will demonstrate left hip ROM grossly WFL in order to return to kayaking    Baseline patient exhibits limitations in all planes of left hip motion    Time 8    Period Weeks    Status New    Target Date 03/13/21      PT LONG TERM GOAL #4   Title Patient will demonstrate left hip strength >/= 4+/5 MMT in order to return to golfing tasks    Baseline left hip strength gross 3+/5 - 4-/5 MMT    Time 8    Period Weeks    Status New    Target Date 03/13/21  Plan - 02/07/21 1415     Clinical Impression Statement Patient tolerated therapy well with no adverse effects. Therapy focused on progressing left hip extension with hip flexor stretches, and progressing hip strength and functional movements such as squats and step-ups. Patient does exhibit improved hip extension to neutral and was ambulating without AD however with antalgic gait. He tolerated progression in strengthening well without report of pain. No change  in HEP this visit. Patient would benefit from continued skilled PT to progress mobility and strength in order to reduce pain with activity and maximize his functional ability.    PT Treatment/Interventions ADLs/Self Care Home Management;Aquatic Therapy;Cryotherapy;Electrical Stimulation;Iontophoresis 4mg /ml Dexamethasone;Moist Heat;Traction;Ultrasound;Neuromuscular re-education;Balance training;Therapeutic exercise;Therapeutic activities;Functional mobility training;Stair training;Gait training;Patient/family education;Manual techniques;Dry needling;Passive range of motion;Taping;Spinal Manipulations;Joint Manipulations;Vasopneumatic Device    PT Next Visit Plan Review HEP and progress PRN, stretching for hip motion, progress strengthening as tolerated, progress to standing exercise    PT Home Exercise Plan [email or web address]    Consulted and Agree with Plan of Care Patient             Patient will benefit from skilled therapeutic intervention in order to improve the following deficits and impairments:  Abnormal gait, Decreased range of motion, Difficulty walking, Pain, Decreased activity tolerance, Decreased balance, Impaired flexibility, Improper body mechanics, Decreased strength  Visit Diagnosis: Pain in left hip  Stiffness of left hip, not elsewhere classified  Muscle weakness (generalized)  Other abnormalities of gait and mobility     Problem List Patient Active Problem List   Diagnosis Date Noted   S/P hip replacement, left 12/11/2020   AVN (avascular necrosis of bone) (HCC) 12/06/2020   AVN of femur (HCC) 07/07/2020    07/09/2020, PT, DPT, LAT, ATC 02/07/21  2:55 PM Phone: [phone] Fax: [phone]   [facility] Outpatient Rehabilitation [facility] [address] [place], [place], [place] Phone: [phone]   Fax:  [phone]  Name: Banyan Goodchild MRN: Hulda Marin Date of Birth: March 26, 1976

## 2021-02-14 ENCOUNTER — Encounter: Payer: Self-pay | Admitting: Physical Therapy

## 2021-02-14 ENCOUNTER — Other Ambulatory Visit: Payer: Self-pay

## 2021-02-14 ENCOUNTER — Ambulatory Visit: Payer: Medicaid Other | Admitting: Physical Therapy

## 2021-02-14 DIAGNOSIS — M25552 Pain in left hip: Secondary | ICD-10-CM | POA: Diagnosis not present

## 2021-02-14 DIAGNOSIS — M6281 Muscle weakness (generalized): Secondary | ICD-10-CM

## 2021-02-14 DIAGNOSIS — M25652 Stiffness of left hip, not elsewhere classified: Secondary | ICD-10-CM

## 2021-02-14 DIAGNOSIS — R2689 Other abnormalities of gait and mobility: Secondary | ICD-10-CM

## 2021-02-14 NOTE — Therapy (Signed)
[facility] Outpatient Rehabilitation [facility] [address] [place], [place], [address] Phone: [phone]   Fax:  [phone]  Physical Therapy Treatment  Patient Details  Name: Keith Alvarado MRN: [id number] Date of Birth: 1976/04/28 Referring Provider (PT): Gean Birchwood, MD   Encounter Date: 02/14/2021   PT End of Session - 02/14/21 1408     Visit Number 3    Number of Visits 8    Date for PT Re-Evaluation 03/13/21    Authorization Type [employer]    Authorization Time Period 01/26/21 - 03/18/21    Authorization - Visit Number 2    Authorization - Number of Visits 8    PT Start Time 1425    PT Stop Time 1510    PT Time Calculation (min) 45 min    Activity Tolerance Patient tolerated treatment well    Behavior During Therapy [facility] for tasks assessed/performed             Past Medical History:  Diagnosis Date   Medical history non-contributory     Past Surgical History:  Procedure Laterality Date   FOOT SURGERY Left    JOINT REPLACEMENT Right 2021   TOTAL HIP ARTHROPLASTY Right 07/17/2020   Procedure: RIGHT TOTAL HIP ARTHROPLASTY ANTERIOR APPROACH;  Surgeon: Gean Birchwood, MD;  Location: [facility];  Service: Orthopedics;  Laterality: Right;   TOTAL HIP ARTHROPLASTY Left 12/11/2020   Procedure: LEFT TOTAL HIP ARTHROPLASTY ANTERIOR APPROACH;  Surgeon: Gean Birchwood, MD;  Location: [facility];  Service: Orthopedics;  Laterality: Left;    There were no vitals filed for this visit.   Subjective Assessment - 02/14/21 1407     Subjective Patient reports hip is "not bad at all." He feels like he has only had pain maybe twice since last visit. States when he has pain, there is just some discomfort a long the incision.    Patient Stated Goals Get back to golf and kayaking    Currently in Pain? No/denies    Pain Score 0-No pain    Pain Location Hip    Pain Orientation Left    Pain Descriptors / Indicators Tightness    Pain Type Surgical pain     Pain Onset More than a month ago    Pain Frequency Intermittent                [facility] PT Assessment - 02/14/21 0001       Assessment   Medical Diagnosis S/P L total hip arthoplasty    Referring Provider (PT) Gean Birchwood, MD      Precautions   Precautions None      Restrictions   Weight Bearing Restrictions No      AROM   Left Hip Extension 0      PROM   Left Hip Flexion 110      Strength   Left Hip Extension 4-/5    Left Hip ABduction 3+/5                           [facility] Adult PT Treatment/Exercise - 02/14/21 0001       Self-Care   Self-Care Other Self-Care Comments    Other Self-Care Comments  Progression back to golf, beginning with chipping and putting for now, < 30-40 repetitions or minutes of practice      Exercises   Exercises Knee/Hip      Knee/Hip Exercises: Stretches   Passive Hamstring Stretch 2 reps;30 seconds  Passive Hamstring Stretch Limitations supine with strap    Hip Flexor Stretch 2 reps;60 seconds    Hip Flexor Stretch Limitations modified thomas PROM    Other Knee/Hip Stretches Windshield wiper hip IR/ER stretch 10 x 5 sec      Knee/Hip Exercises: Aerobic   Nustep L6 x 5 min with LE/UE while taking subjective      Knee/Hip Exercises: Machines for Strengthening   Total Gym Leg Press 55# 2 x 10      Knee/Hip Exercises: Seated   Sit to Sand 2 sets;10 reps;without UE support      Knee/Hip Exercises: Supine   Bridges 10 reps   2 sets, 3 sec hold   Bridges Limitations green band around knees for 2nd set                    PT Education - 02/14/21 1407     Education Details HEP update, progression for return to golf    Person(s) Educated Patient    Methods Explanation;Demonstration;Verbal cues;Handout    Comprehension Verbalized understanding;Returned demonstration;Verbal cues required;Need further instruction              PT Short Term Goals - 02/14/21 1428       PT SHORT TERM GOAL #1   Title  He wil be independent with initial HEP to progress with PT    Baseline patient reports consistency with initial HEP    Time 4    Period Weeks    Status Achieved    Target Date 02/13/21      PT SHORT TERM GOAL #2   Title Patient will ambulate in community without device to improve community access and shopping    Baseline patient ambulating without AD without limitation    Time 4    Period Weeks    Status Achieved    Target Date 02/13/21      PT SHORT TERM GOAL #3   Title Patient will achieve neutral hip extension to improve gait and functional mobility    Baseline 0 deg (neutral)    Time 4    Period Weeks    Status Achieved    Target Date 02/13/21               PT Long Term Goals - 01/17/21 0845       PT LONG TERM GOAL #1   Title He will be independent with final HEP issued to maintain progress from PT    Baseline HEP rpovided at eval    Time 8    Period Weeks    Status New    Target Date 03/13/21      PT LONG TERM GOAL #2   Title Patient will report no limitations with walking or standing duration to improve ability to return to work    Baseline patient reports limitations with standing and walking "not long"    Time 8    Period Weeks    Status New    Target Date 03/13/21      PT LONG TERM GOAL #3   Title Patient will demonstrate left hip ROM grossly WFL in order to return to kayaking    Baseline patient exhibits limitations in all planes of left hip motion    Time 8    Period Weeks    Status New    Target Date 03/13/21      PT LONG TERM GOAL #4   Title Patient will demonstrate left hip strength >/=  4+/5 MMT in order to return to golfing tasks    Baseline left hip strength gross 3+/5 - 4-/5 MMT    Time 8    Period Weeks    Status New    Target Date 03/13/21                   Plan - 02/14/21 1408     Clinical Impression Statement Patient tolerated therapy well with no adverse effects. He demonstrates improved hip motion and strength this  visit, and is tolerating progression in exercises well without increase in pain. He does demonstrate continue hip flexor tightness limiting hip extension and gross overall hip weakness. Progressed stretching for hip to inlcude IR/ER to help with return to gold and progress strengthening exercises for home. Patient would benefit from continued skilled PT to progress mobility and strength in order to reduce pain with activity and maximize his functional ability.    PT Treatment/Interventions ADLs/Self Care Home Management;Aquatic Therapy;Cryotherapy;Electrical Stimulation;Iontophoresis 4mg /ml Dexamethasone;Moist Heat;Traction;Ultrasound;Neuromuscular re-education;Balance training;Therapeutic exercise;Therapeutic activities;Functional mobility training;Stair training;Gait training;Patient/family education;Manual techniques;Dry needling;Passive range of motion;Taping;Spinal Manipulations;Joint Manipulations;Vasopneumatic Device    PT Next Visit Plan Review HEP and progress PRN, stretching for hip motion, progress strengthening as tolerated, progress to standing exercise, golf specific exercise    PT Home Exercise Plan [email or web address]    Consulted and Agree with Plan of Care Patient             Patient will benefit from skilled therapeutic intervention in order to improve the following deficits and impairments:  Abnormal gait, Decreased range of motion, Difficulty walking, Pain, Decreased activity tolerance, Decreased balance, Impaired flexibility, Improper body mechanics, Decreased strength  Visit Diagnosis: Pain in left hip  Stiffness of left hip, not elsewhere classified  Muscle weakness (generalized)  Other abnormalities of gait and mobility     Problem List Patient Active Problem List   Diagnosis Date Noted   S/P hip replacement, 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 [place], [place], [place] Phone: [phone]   Fax:  [phone]  Name: Keith Alvarado MRN: Hulda Marin Date of Birth: [birth year]

## 2021-02-14 NOTE — Patient Instructions (Signed)
Access Code: [email or web address] URL: [email or web address] Date: 02/14/2021 Prepared by: Rosana Hoes  Exercises Prone Quadriceps Stretch with Strap - 1-2 x daily - 7 x weekly - 3 reps - 30 hold Supine Quadriceps Stretch with Strap on Table - 1-2 x daily - 7 x weekly - 3 reps - 30 hold Supine Hip Internal and External Rotation - 1-2 x daily - 7 x weekly - 10 reps - 5 hold Supine Hamstring Stretch with Strap - 1-2 x daily - 7 x weekly - 3 reps - 30 hold Supine Bridge with Resistance Band - 1 x daily - 5 x weekly - 3 sets - 10 reps - 3 hold Side Stepping with Resistance at Thighs - 1 x daily - 5 x weekly - 3 sets - 15 reps Sit to Stand Without Arm Support - 1 x daily - 5 x weekly - 3 sets - 10 reps

## 2021-02-21 ENCOUNTER — Ambulatory Visit: Payer: Medicaid Other | Attending: Orthopedic Surgery | Admitting: Physical Therapy

## 2021-02-21 ENCOUNTER — Telehealth: Payer: Self-pay | Admitting: Physical Therapy

## 2021-02-21 DIAGNOSIS — M6281 Muscle weakness (generalized): Secondary | ICD-10-CM | POA: Insufficient documentation

## 2021-02-21 DIAGNOSIS — M25552 Pain in left hip: Secondary | ICD-10-CM | POA: Insufficient documentation

## 2021-02-21 DIAGNOSIS — R2689 Other abnormalities of gait and mobility: Secondary | ICD-10-CM | POA: Insufficient documentation

## 2021-02-21 DIAGNOSIS — M25652 Stiffness of left hip, not elsewhere classified: Secondary | ICD-10-CM | POA: Insufficient documentation

## 2021-02-21 NOTE — Telephone Encounter (Signed)
Contacted patient due to missed PT appointment. He was informed of the missed appointment, and he stated he had his times mixed up. Patient elected to reschedule for 02/22/21 at 8:30am. Patient was advised of the attendance policy. He expressed understanding.  Rosana Hoes, PT, DPT, LAT, ATC 02/21/21  5:12 PM Phone: [phone] Fax: [phone]

## 2021-02-22 ENCOUNTER — Ambulatory Visit: Payer: Medicaid Other | Admitting: Physical Therapy

## 2021-02-22 ENCOUNTER — Encounter: Payer: Self-pay | Admitting: Physical Therapy

## 2021-02-22 ENCOUNTER — Other Ambulatory Visit: Payer: Self-pay

## 2021-02-22 DIAGNOSIS — M25552 Pain in left hip: Secondary | ICD-10-CM

## 2021-02-22 DIAGNOSIS — M25652 Stiffness of left hip, not elsewhere classified: Secondary | ICD-10-CM

## 2021-02-22 DIAGNOSIS — R2689 Other abnormalities of gait and mobility: Secondary | ICD-10-CM

## 2021-02-22 DIAGNOSIS — M6281 Muscle weakness (generalized): Secondary | ICD-10-CM

## 2021-02-22 NOTE — Therapy (Addendum)
[place], [place], [address] Phone: [phone]   Fax:  [phone]  Physical Therapy Treatment / Discharge  Patient Details  Name: Keith Alvarado MRN: [id number] Date of Birth: 20-Dec-1975 Referring Provider (PT): Frederik Pear, MD   Encounter Date: 02/22/2021   PT End of Session - 02/22/21 0831     Visit Number 4    Number of Visits 8    Date for PT Re-Evaluation 03/13/21    Authorization Type [employer]    Authorization Time Period 01/26/21 - 03/18/21    Authorization - Visit Number 3    Authorization - Number of Visits 8    PT Start Time 0830    PT Stop Time 0910    PT Time Calculation (min) 40 min    Activity Tolerance Patient tolerated treatment well    Behavior During Therapy [facility] for tasks assessed/performed             Past Medical History:  Diagnosis Date   Medical history non-contributory     Past Surgical History:  Procedure Laterality Date   FOOT SURGERY Left    JOINT REPLACEMENT Right 2021   TOTAL HIP ARTHROPLASTY Right 07/17/2020   Procedure: RIGHT TOTAL HIP ARTHROPLASTY ANTERIOR APPROACH;  Surgeon: Frederik Pear, MD;  Location: [facility];  Service: Orthopedics;  Laterality: Right;   TOTAL HIP ARTHROPLASTY Left 12/11/2020   Procedure: LEFT TOTAL HIP ARTHROPLASTY ANTERIOR APPROACH;  Surgeon: Frederik Pear, MD;  Location: [facility];  Service: Orthopedics;  Laterality: Left;    There were no vitals filed for this visit.   Subjective Assessment - 02/22/21 0833     Subjective Patient reports things are going "well." He states he is going to go chip and putt at the golf course after therapy today.    Patient Stated Goals Get back to golf and kayaking    Currently in Pain? No/denies                [facility] PT Assessment - 02/22/21 0001       Strength   Left Hip Extension 4-/5    Left Hip ABduction 4-/5                           [facility] Adult PT  Treatment/Exercise - 02/22/21 0001       Exercises   Exercises Knee/Hip      Knee/Hip Exercises: Stretches   Hip Flexor Stretch 60 seconds    Hip Flexor Stretch Limitations modified thomas PROM    Piriformis Stretch 2 reps;30 seconds    Piriformis Stretch Limitations supine KTOS    Other Knee/Hip Stretches Windshield wiper hip IR/ER stretch 10 x 5 sec      Knee/Hip Exercises: Standing   Forward Step Up 2 sets;10 reps    Forward Step Up Limitations 8" step height    SLS SL RDL with slider reaching to cone 3 x 8    Other Standing Knee Exercises Lateral band walk with green around knees 3 x 20    Other Standing Knee Exercises Pallof press #7 FM 10 x 3 sec hold each      Knee/Hip Exercises: Supine   Single Leg Bridge 3 sets;5 reps      Knee/Hip Exercises: Prone   Other Prone Exercises Bird dog 2 x 10  PT Education - 02/22/21 0831     Education Details HEP update, progression for golf    Person(s) Educated Patient    Methods Explanation;Demonstration;Verbal cues    Comprehension Verbalized understanding;Returned demonstration;Verbal cues required;Need further instruction              PT Short Term Goals - 02/14/21 1428       PT SHORT TERM GOAL #1   Title He wil be independent with initial HEP to progress with PT    Baseline patient reports consistency with initial HEP    Time 4    Period Weeks    Status Achieved    Target Date 02/13/21      PT SHORT TERM GOAL #2   Title Patient will ambulate in community without device to improve community access and shopping    Baseline patient ambulating without AD without limitation    Time 4    Period Weeks    Status Achieved    Target Date 02/13/21      PT SHORT TERM GOAL #3   Title Patient will achieve neutral hip extension to improve gait and functional mobility    Baseline 0 deg (neutral)    Time 4    Period Weeks    Status Achieved    Target Date 02/13/21               PT Long  Term Goals - 01/17/21 0845       PT LONG TERM GOAL #1   Title He will be independent with final HEP issued to maintain progress from PT    Baseline HEP rpovided at eval    Time 8    Period Weeks    Status New    Target Date 03/13/21      PT LONG TERM GOAL #2   Title Patient will report no limitations with walking or standing duration to improve ability to return to work    Baseline patient reports limitations with standing and walking "not long"    Time 8    Period Weeks    Status New    Target Date 03/13/21      PT LONG TERM GOAL #3   Title Patient will demonstrate left hip ROM grossly WFL in order to return to kayaking    Baseline patient exhibits limitations in all planes of left hip motion    Time 8    Period Weeks    Status New    Target Date 03/13/21      PT LONG TERM GOAL #4   Title Patient will demonstrate left hip strength >/= 4+/5 MMT in order to return to golfing tasks    Baseline left hip strength gross 3+/5 - 4-/5 MMT    Time 8    Period Weeks    Status New    Target Date 03/13/21                   Plan - 02/22/21 0835     Clinical Impression Statement Patient tolerated therapy well with no adverse effects. Continued focus on progressing hip flexibility and strength this visit. Patient does exhibit gradual improvement in hip strength on the left. Incorporated progression of single leg strengthening/stability with good tolerance. He continues to require encouragement for consistency with HEP and again instructed on gradual return to golf. Patient would benefit from continued skilled PT to progress mobility and strength in order to reduce pain with activity and maximize his functional ability.  PT Treatment/Interventions ADLs/Self Care Home Management;Aquatic Therapy;Cryotherapy;Electrical Stimulation;Iontophoresis 54m/ml Dexamethasone;Moist Heat;Traction;Ultrasound;Neuromuscular re-education;Balance training;Therapeutic exercise;Therapeutic  activities;Functional mobility training;Stair training;Gait training;Patient/family education;Manual techniques;Dry needling;Passive range of motion;Taping;Spinal Manipulations;Joint Manipulations;Vasopneumatic Device    PT Next Visit Plan Review HEP and progress PRN, stretching for hip motion, progress strengthening as tolerated, golf specific exercise    PT Home Exercise Plan [email or web address]    Consulted and Agree with Plan of Care Patient             Patient will benefit from skilled therapeutic intervention in order to improve the following deficits and impairments:  Abnormal gait, Decreased range of motion, Difficulty walking, Pain, Decreased activity tolerance, Decreased balance, Impaired flexibility, Improper body mechanics, Decreased strength  Visit Diagnosis: Pain in left hip  Stiffness of left hip, not elsewhere classified  Muscle weakness (generalized)  Other abnormalities of gait and mobility     Problem List Patient Active Problem List   Diagnosis Date Noted   S/P hip replacement, 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 Patient goals were not met. Patient is being discharged due to  attendance policy (no shows).

## 2021-02-28 ENCOUNTER — Ambulatory Visit: Payer: Medicaid Other | Admitting: Physical Therapy

## 2021-02-28 ENCOUNTER — Telehealth: Payer: Self-pay | Admitting: Physical Therapy

## 2021-02-28 NOTE — Telephone Encounter (Signed)
Contacted patent due to missed PT appointment. Patient stated he was on the way but had already missed appointment and stated he had the wrong time. Patient wanted to reschedule but no appointments available for patient. He does have another appointment scheduled on 03/07/2021 at 2:45pm so patient reminded of this appointment and advised he would only be able to schedule 1 visit at a time due to attendance policy. Patient expressed understanding.  Rosana Hoes, PT, DPT, LAT, ATC 02/28/21  3:56 PM Phone: [phone] Fax: [phone]

## 2021-03-07 ENCOUNTER — Telehealth: Payer: Self-pay | Admitting: Physical Therapy

## 2021-03-07 ENCOUNTER — Ambulatory Visit: Payer: Medicaid Other | Admitting: Physical Therapy

## 2021-03-07 NOTE — Telephone Encounter (Signed)
Attempted to contact patient due to missed PT appointment. Left Vm informing patient of the missed appointment and advised him that he would be discharged from PT due to attendance policy (4 no shows) and would require a new PT referral in order to return to therapy.  Rosana Hoes, PT, DPT, LAT, ATC 03/07/21  3:55 PM Phone: [phone] Fax: [phone]

## 2022-02-01 ENCOUNTER — Encounter (HOSPITAL_COMMUNITY): Payer: Self-pay

## 2022-02-01 ENCOUNTER — Emergency Department (HOSPITAL_COMMUNITY)
Admission: EM | Admit: 2022-02-01 | Discharge: 2022-02-01 | Disposition: A | Discharge status: Home / Self Care | Payer: Medicaid Other | Attending: Emergency Medicine | Admitting: Emergency Medicine

## 2022-02-01 DIAGNOSIS — Z7982 Long term (current) use of aspirin: Secondary | ICD-10-CM | POA: Diagnosis not present

## 2022-02-01 DIAGNOSIS — S61214A Laceration without foreign body of right ring finger without damage to nail, initial encounter: Secondary | ICD-10-CM | POA: Insufficient documentation

## 2022-02-01 DIAGNOSIS — Z23 Encounter for immunization: Secondary | ICD-10-CM | POA: Insufficient documentation

## 2022-02-01 DIAGNOSIS — S6991XA Unspecified injury of right wrist, hand and finger(s), initial encounter: Secondary | ICD-10-CM | POA: Diagnosis present

## 2022-02-01 DIAGNOSIS — W269XXA Contact with unspecified sharp object(s), initial encounter: Secondary | ICD-10-CM | POA: Insufficient documentation

## 2022-02-01 DIAGNOSIS — L089 Local infection of the skin and subcutaneous tissue, unspecified: Secondary | ICD-10-CM

## 2022-02-01 MED ORDER — BACITRACIN ZINC 500 UNIT/GM EX OINT
TOPICAL_OINTMENT | Freq: Once | CUTANEOUS | Status: AC
  Administered 2022-02-01: 1 via TOPICAL
  Filled 2022-02-01: qty 28.4

## 2022-02-01 MED ORDER — AMOXICILLIN-POT CLAVULANATE 875-125 MG PO TABS
1.0000 | ORAL_TABLET | Freq: Once | ORAL | Status: AC
  Administered 2022-02-01: 1 via ORAL
  Filled 2022-02-01: qty 1

## 2022-02-01 MED ORDER — TETANUS-DIPHTH-ACELL PERTUSSIS 5-2.5-18.5 LF-MCG/0.5 IM SUSY
0.5000 mL | PREFILLED_SYRINGE | Freq: Once | INTRAMUSCULAR | Status: AC
  Administered 2022-02-01: 0.5 mL via INTRAMUSCULAR
  Filled 2022-02-01: qty 0.5

## 2022-02-01 MED ORDER — AMOXICILLIN-POT CLAVULANATE 875-125 MG PO TABS: 1.0000 | ORAL_TABLET | Freq: Two times a day (BID) | ORAL | 0 refills | Status: DC

## 2022-02-01 NOTE — ED Provider Notes (Signed)
[facility] EMERGENCY DEPARTMENT Provider Note   CSN: [id number] Arrival date & time: 02/01/22  0343     History  Chief Complaint  Patient presents with   Finger Injury    Keith Alvarado is a 46 y.o. male who presents with concern for pain and swelling to the right pinky.  He states that earlier in the week he cut his pinky on something and it began to heal, however as it healed he began to pick at the scab.  Subsequently began to become somewhat swollen.  He wears a pinky ring daily on the finger which he never removes.  He states that as the finger became more swollen was difficult to remove 3 so he did not, however in the night he woke up due to severe pain and bluish discoloration of his pinky prompting his presentation to the ER.  I was called to the bedside by triage RN due to concern for discoloration of the finger.  Impression reviewed this patient's medical records.  Not on any medications daily.  Tetanus is not up-to-date.  HPI     Home Medications Prior to Admission medications   Medication Sig Start Date End Date Taking? Authorizing Provider  aspirin EC 81 MG tablet Take 1 tablet (81 mg total) by mouth 2 (two) times daily. 12/11/20   Allena Katz, PA-C  Multiple Vitamin (MULTIVITAMIN WITH MINERALS) TABS tablet Take 1 tablet by mouth daily. Centrum    [provider]  OVER THE COUNTER MEDICATION Take 2 capsules by mouth daily. [employer], Historical, MD  oxyCODONE-acetaminophen (PERCOCET/ROXICET) 5-325 MG tablet Take 1-2 tablets by mouth every 4 (four) hours as needed for severe pain. 12/11/20   Allena Katz, PA-C  tiZANidine (ZANAFLEX) 2 MG tablet Take 1 tablet (2 mg total) by mouth every 6 (six) hours as needed. 12/11/20   Allena Katz, PA-C      Allergies    Patient has no known allergies.    Review of Systems   Review of Systems  Musculoskeletal:        Pinky swelling  Skin:  Positive for wound.     Physical Exam Updated Vital Signs BP (!) 184/106 (BP Location: Right Arm)   Pulse 97   Temp 98.1 F (36.7 C) (Oral)   Resp 17   SpO2 98%  Physical Exam Vitals and nursing note reviewed.  HENT:     Head: Normocephalic and atraumatic.  Eyes:     General: No scleral icterus.       Right eye: No discharge.        Left eye: No discharge.     Conjunctiva/sclera: Conjunctivae normal.  Pulmonary:     Effort: Pulmonary effort is normal.  Musculoskeletal:       Hands:  Skin:    General: Skin is warm and dry.     Capillary Refill: Capillary refill takes less than 2 seconds.  Neurological:     General: No focal deficit present.     Mental Status: He is alert.  Psychiatric:        Mood and Affect: Mood normal.     ED Results / Procedures / Treatments   Labs (all labs ordered are listed, but only abnormal results are displayed) Labs Reviewed - No data to display  EKG None  Radiology No results found.  Procedures Procedures    Medications Ordered in ED Medications - No data to display  ED Course/ Medical  Decision Making/ A&P                           Medical Decision Making 46 year old male presents with ring stuck on right pinky finger as well as concern for pain in the finger.  Hypertensive on intake so in pain.  Cardiopulmonary exam is normal, abdominal exam is benign.  Patient with mildly delayed cap refill in the right pinky only, bluish discoloration of the digit, and wound as above overlying the medial aspect of the PIP joint.  Ring cut off by this provider with subsequent significant improvement in in coloration of his finger.  Nice and pink at this time with normal capillary refill.  Reevaluated after few minutes of improved perfusion with removal of ring with mild improvement and swelling.  Patient without any further purulent drainage at this time, wound itself appears very mild without any redness streaking towards the, tenderness palpation, or  fluctuance.  Risk OTC drugs. Prescription drug management.   Patient's digital perfusion significant improved after removal of his ring.  Pain is resolved as well.  We will start patient on oral antibiotics for cellulitis around small finger wound, however no indication for any sort of incision and drainage or further ED work-up at this time. We will have patient follow-up in the office setting with his PCP and strict return precautions are given.  [name]' voiced understanding of her medical evaluation and treatment plan. Each of their questions answered to their expressed satisfaction.  Return precautions were given.  Patient is well-appearing, stable, and was discharged in good condition.  This chart was dictated using voice recognition software, Dragon. Despite the best efforts of this provider to proofread and correct errors, errors may still occur which can change documentation meaning.         Final Clinical Impression(s) / ED Diagnoses Final diagnoses:  None    Rx / DC Orders ED Discharge Orders     None         Paris Lore, PA-C 02/01/22 0724    Melene Plan, DO 02/04/22 [phone]

## 2022-02-01 NOTE — Discharge Instructions (Signed)
Please take the prescribed antibiotic for the entire course. Follow up with your PCP, return to the ER with any new severe symptoms.

## 2022-02-01 NOTE — ED Triage Notes (Signed)
Pt states that he cut his R pinky the other day and he began to pick at the scab, finger is now very swollen, discolored, and cool, pt has a ring on his finger and unable to get it off.

## 2022-05-13 NOTE — Progress Notes (Signed)
Keith Alvarado states that he is not sure if he will have surgery tomorrow, "it is still swollen,I was getting ready to call Dr. [name]." I told patient that I will check back to see if he is still on the schedule.

## 2022-05-15 ENCOUNTER — Other Ambulatory Visit: Payer: Self-pay

## 2022-05-15 ENCOUNTER — Encounter (HOSPITAL_COMMUNITY): Payer: Self-pay | Admitting: Orthopedic Surgery

## 2022-05-15 NOTE — Anesthesia Preprocedure Evaluation (Signed)
Anesthesia Evaluation  Patient identified by MRN, date of birth, ID band Patient awake    Reviewed: Allergy & Precautions, NPO status , Patient's Chart, lab work & pertinent test results  History of Anesthesia Complications Negative for: history of anesthetic complications  Airway Mallampati: II  TM Distance: >3 FB Neck ROM: Full    Dental  (+) Missing, Dental Advisory Given   Pulmonary Current Smoker and Patient abstained from smoking.,    breath sounds clear to auscultation       Cardiovascular negative cardio ROS   Rhythm:Regular Rate:Normal     Neuro/Psych negative neurological ROS     GI/Hepatic negative GI ROS, (+)     substance abuse  marijuana use,   Endo/Other  negative endocrine ROS  Renal/GU negative Renal ROS     Musculoskeletal   Abdominal   Peds  Hematology negative hematology ROS (+)   Anesthesia Other Findings   Reproductive/Obstetrics                            Anesthesia Physical Anesthesia Plan  ASA: 2  Anesthesia Plan: General   Post-op Pain Management: Tylenol PO (pre-op)*   Induction: Intravenous  PONV Risk Score and Plan: 2 and Ondansetron and Dexamethasone  Airway Management Planned: Oral ETT  Additional Equipment: None  Intra-op Plan:   Post-operative Plan: Extubation in OR  Informed Consent: I have reviewed the patients History and Physical, chart, labs and discussed the procedure including the risks, benefits and alternatives for the proposed anesthesia with the patient or authorized representative who has indicated his/her understanding and acceptance.     Dental advisory given  Plan Discussed with: CRNA and Surgeon  Anesthesia Plan Comments:        Anesthesia Quick Evaluation

## 2022-05-15 NOTE — Progress Notes (Signed)
Per Ainsley Spinner, PA, patient is having his surgery tomorrow as planned. (Patient states "not sure if I will have surgery tomorrow because of swelling).  PCP - none Cardiologist - n/a  Chest x-ray - n/a EKG - n/a Stress Test - n/a ECHO - n/a Cardiac Cath - n/a  ICD Pacemaker/Loop - n/a  Sleep Study -  n/a CPAP - none  Aspirin Instructions: Follow your surgeon's instructions on when to stop aspirin prior to surgery,  If no instructions were given by your surgeon then you will need to call the office for those instructions.  ERAS: Clear liquids til 5 AM DOS.  STOP now taking any Aspirin (unless otherwise instructed by your surgeon), Aleve, Naproxen, Ibuprofen, Motrin, Advil, Goody's, BC's, all herbal medications, fish oil, and all vitamins.   Coronavirus Screening Do you have any of the following symptoms:  Cough yes/no: No Fever (>100.45F)  yes/no: No Runny nose yes/no: No Sore throat yes/no: No Difficulty breathing/shortness of breath  yes/no: No  Have you traveled in the last 14 days and where? Yes, [place]  Patient verbalized understanding of instructions that were given via phone.

## 2022-05-16 ENCOUNTER — Inpatient Hospital Stay (HOSPITAL_COMMUNITY): Payer: Medicaid Other | Admitting: Anesthesiology

## 2022-05-16 ENCOUNTER — Encounter (HOSPITAL_COMMUNITY): Payer: Self-pay | Admitting: Orthopedic Surgery

## 2022-05-16 ENCOUNTER — Inpatient Hospital Stay (HOSPITAL_COMMUNITY): Payer: Medicaid Other

## 2022-05-16 ENCOUNTER — Encounter (HOSPITAL_COMMUNITY)
Admission: RE | Disposition: A | Discharge status: Home / Self Care | Payer: Self-pay | Source: Home / Self Care | Attending: Orthopedic Surgery

## 2022-05-16 ENCOUNTER — Other Ambulatory Visit: Payer: Self-pay

## 2022-05-16 ENCOUNTER — Inpatient Hospital Stay (HOSPITAL_COMMUNITY)
Admission: RE | Admit: 2022-05-16 | Discharge: 2022-05-18 | DRG: 489 | Disposition: A | Discharge status: Home / Self Care | Payer: Medicaid Other | Attending: Orthopedic Surgery | Admitting: Orthopedic Surgery

## 2022-05-16 DIAGNOSIS — Z96643 Presence of artificial hip joint, bilateral: Secondary | ICD-10-CM | POA: Diagnosis present

## 2022-05-16 DIAGNOSIS — F17200 Nicotine dependence, unspecified, uncomplicated: Secondary | ICD-10-CM | POA: Diagnosis present

## 2022-05-16 DIAGNOSIS — E559 Vitamin D deficiency, unspecified: Secondary | ICD-10-CM | POA: Diagnosis present

## 2022-05-16 DIAGNOSIS — Z7982 Long term (current) use of aspirin: Secondary | ICD-10-CM

## 2022-05-16 DIAGNOSIS — Z79899 Other long term (current) drug therapy: Secondary | ICD-10-CM

## 2022-05-16 DIAGNOSIS — S82142A Displaced bicondylar fracture of left tibia, initial encounter for closed fracture: Principal | ICD-10-CM | POA: Diagnosis present

## 2022-05-16 DIAGNOSIS — S83282A Other tear of lateral meniscus, current injury, left knee, initial encounter: Secondary | ICD-10-CM | POA: Diagnosis present

## 2022-05-16 DIAGNOSIS — S82122A Displaced fracture of lateral condyle of left tibia, initial encounter for closed fracture: Secondary | ICD-10-CM | POA: Diagnosis present

## 2022-05-16 HISTORY — PX: ORIF TIBIA PLATEAU: SHX2132

## 2022-05-16 LAB — COMPREHENSIVE METABOLIC PANEL
ALT: 20 U/L (ref 0–44)
AST: 27 U/L (ref 15–41)
Albumin: 3.6 g/dL (ref 3.5–5.0)
Alkaline Phosphatase: 75 U/L (ref 38–126)
Anion gap: 7 (ref 5–15)
BUN: 8 mg/dL (ref 6–20)
CO2: 30 mmol/L (ref 22–32)
Calcium: 9.3 mg/dL (ref 8.9–10.3)
Chloride: 100 mmol/L (ref 98–111)
Creatinine, Ser: 0.81 mg/dL (ref 0.61–1.24)
GFR, Estimated: >60 mL/min (ref >60)
Glucose, Bld: 94 mg/dL (ref 70–99)
Potassium: 4.4 mmol/L (ref 3.5–5.1)
Sodium: 137 mmol/L (ref 135–145)
Total Bilirubin: 0.7 mg/dL (ref 0.3–1.2)
Total Protein: 6.3 g/dL — ABNORMAL LOW (ref 6.5–8.1)

## 2022-05-16 LAB — CBC WITH DIFFERENTIAL/PLATELET
Abs Immature Granulocytes: 0.52 10*3/uL — ABNORMAL HIGH (ref 0.00–0.07)
Basophils Absolute: 0.1 10*3/uL (ref 0.0–0.1)
Basophils Relative: 1 %
Eosinophils Absolute: 0.2 10*3/uL (ref 0.0–0.5)
Eosinophils Relative: 1 %
HCT: 40.4 % (ref 39.0–52.0)
Hemoglobin: 13.8 g/dL (ref 13.0–17.0)
Immature Granulocytes: 4 %
Lymphocytes Relative: 23 %
Lymphs Abs: 3 10*3/uL (ref 0.7–4.0)
MCH: 34.8 pg — ABNORMAL HIGH (ref 26.0–34.0)
MCHC: 34.2 g/dL (ref 30.0–36.0)
MCV: 102 fL — ABNORMAL HIGH (ref 80.0–100.0)
Monocytes Absolute: 1.8 10*3/uL — ABNORMAL HIGH (ref 0.1–1.0)
Monocytes Relative: 14 %
Neutro Abs: 7.3 10*3/uL (ref 1.7–7.7)
Neutrophils Relative %: 57 %
Platelets: 409 10*3/uL — ABNORMAL HIGH (ref 150–400)
RBC: 3.96 MIL/uL — ABNORMAL LOW (ref 4.22–5.81)
RDW: 12.5 % (ref 11.5–15.5)
WBC: 12.9 10*3/uL — ABNORMAL HIGH (ref 4.0–10.5)
nRBC: 0 % (ref 0.0–0.2)

## 2022-05-16 LAB — RAPID URINE DRUG SCREEN, HOSP PERFORMED
Amphetamines: NOT DETECTED
Barbiturates: NOT DETECTED
Benzodiazepines: NOT DETECTED
Cocaine: NOT DETECTED
Opiates: POSITIVE — AB
Tetrahydrocannabinol: NOT DETECTED

## 2022-05-16 LAB — PROTIME-INR
INR: 1 (ref 0.8–1.2)
Prothrombin Time: 12.6 seconds (ref 11.4–15.2)

## 2022-05-16 LAB — VITAMIN D 25 HYDROXY (VIT D DEFICIENCY, FRACTURES): Vit D, 25-Hydroxy: 10.25 ng/mL — ABNORMAL LOW (ref 30–100)

## 2022-05-16 SURGERY — OPEN REDUCTION INTERNAL FIXATION (ORIF) TIBIAL PLATEAU
Anesthesia: General | Site: Leg Lower | Laterality: Left

## 2022-05-16 MED ORDER — SUGAMMADEX SODIUM 200 MG/2ML IV SOLN
INTRAVENOUS | Status: DC | PRN
  Administered 2022-05-16: 200 mg via INTRAVENOUS

## 2022-05-16 MED ORDER — OXYCODONE HCL 5 MG/5ML PO SOLN: 5.0000 mg | Freq: Once | ORAL | Status: DC | PRN

## 2022-05-16 MED ORDER — ONDANSETRON HCL 4 MG/2ML IJ SOLN: 4.0000 mg | Freq: Four times a day (QID) | INTRAMUSCULAR | Status: DC | PRN

## 2022-05-16 MED ORDER — ENOXAPARIN SODIUM 40 MG/0.4ML IJ SOSY
40.0000 mg | PREFILLED_SYRINGE | INTRAMUSCULAR | Status: DC
  Administered 2022-05-17 – 2022-05-18 (×2): 40 mg via SUBCUTANEOUS
  Filled 2022-05-16 (×2): qty 0.4

## 2022-05-16 MED ORDER — FENTANYL CITRATE (PF) 250 MCG/5ML IJ SOLN
INTRAMUSCULAR | Status: AC
  Filled 2022-05-16: qty 5

## 2022-05-16 MED ORDER — HYDROMORPHONE HCL 1 MG/ML IJ SOLN
0.2500 mg | INTRAMUSCULAR | Status: DC | PRN
  Administered 2022-05-16 (×4): 0.5 mg via INTRAVENOUS

## 2022-05-16 MED ORDER — METHOCARBAMOL 500 MG PO TABS
1000.0000 mg | ORAL_TABLET | Freq: Three times a day (TID) | ORAL | Status: DC
  Administered 2022-05-16 – 2022-05-18 (×7): 1000 mg via ORAL
  Filled 2022-05-16 (×7): qty 2

## 2022-05-16 MED ORDER — MEPERIDINE HCL 25 MG/ML IJ SOLN: 6.2500 mg | INTRAMUSCULAR | Status: DC | PRN

## 2022-05-16 MED ORDER — HYDROMORPHONE HCL 1 MG/ML IJ SOLN
INTRAMUSCULAR | Status: AC
  Filled 2022-05-16: qty 1

## 2022-05-16 MED ORDER — PROMETHAZINE HCL 25 MG/ML IJ SOLN: 6.2500 mg | INTRAMUSCULAR | Status: DC | PRN

## 2022-05-16 MED ORDER — OXYCODONE HCL 5 MG PO TABS
10.0000 mg | ORAL_TABLET | ORAL | Status: DC | PRN
  Administered 2022-05-16: 10 mg via ORAL
  Administered 2022-05-16 – 2022-05-18 (×6): 15 mg via ORAL
  Filled 2022-05-16 (×6): qty 3

## 2022-05-16 MED ORDER — LIDOCAINE HCL (CARDIAC) PF 100 MG/5ML IV SOSY
PREFILLED_SYRINGE | INTRAVENOUS | Status: DC | PRN
  Administered 2022-05-16: 100 mg via INTRAVENOUS

## 2022-05-16 MED ORDER — DEXAMETHASONE SODIUM PHOSPHATE 10 MG/ML IJ SOLN
INTRAMUSCULAR | Status: DC | PRN
  Administered 2022-05-16: 10 mg via INTRAVENOUS

## 2022-05-16 MED ORDER — ACETAMINOPHEN 500 MG PO TABS
1000.0000 mg | ORAL_TABLET | Freq: Three times a day (TID) | ORAL | Status: AC
  Administered 2022-05-16 – 2022-05-17 (×4): 1000 mg via ORAL
  Filled 2022-05-16 (×4): qty 2

## 2022-05-16 MED ORDER — CHLORHEXIDINE GLUCONATE 0.12 % MT SOLN
OROMUCOSAL | Status: AC
  Administered 2022-05-16: 15 mL
  Filled 2022-05-16: qty 15

## 2022-05-16 MED ORDER — MIDAZOLAM HCL 2 MG/2ML IJ SOLN
INTRAMUSCULAR | Status: AC
  Filled 2022-05-16: qty 2

## 2022-05-16 MED ORDER — CALCIUM CITRATE 950 (200 CA) MG PO TABS
200.0000 mg | ORAL_TABLET | Freq: Two times a day (BID) | ORAL | Status: DC
  Administered 2022-05-16 – 2022-05-18 (×5): 200 mg via ORAL
  Filled 2022-05-16 (×5): qty 1

## 2022-05-16 MED ORDER — ROCURONIUM 10MG/ML (10ML) SYRINGE FOR MEDFUSION PUMP - OPTIME
INTRAVENOUS | Status: DC | PRN
  Administered 2022-05-16: 100 mg via INTRAVENOUS

## 2022-05-16 MED ORDER — OXYCODONE HCL 5 MG PO TABS
5.0000 mg | ORAL_TABLET | ORAL | Status: DC | PRN
  Administered 2022-05-17 (×2): 10 mg via ORAL
  Filled 2022-05-16 (×2): qty 2

## 2022-05-16 MED ORDER — PROPOFOL 10 MG/ML IV BOLUS
INTRAVENOUS | Status: AC
  Filled 2022-05-16: qty 20

## 2022-05-16 MED ORDER — DOCUSATE SODIUM 100 MG PO CAPS
100.0000 mg | ORAL_CAPSULE | Freq: Two times a day (BID) | ORAL | Status: DC
  Administered 2022-05-16 – 2022-05-18 (×5): 100 mg via ORAL
  Filled 2022-05-16 (×5): qty 1

## 2022-05-16 MED ORDER — METOCLOPRAMIDE HCL 5 MG PO TABS: 5.0000 mg | ORAL_TABLET | Freq: Three times a day (TID) | ORAL | Status: DC | PRN

## 2022-05-16 MED ORDER — HYDROMORPHONE HCL 1 MG/ML IJ SOLN
INTRAMUSCULAR | Status: AC
  Filled 2022-05-16: qty 0.5

## 2022-05-16 MED ORDER — HYDROMORPHONE HCL 1 MG/ML IJ SOLN
0.5000 mg | INTRAMUSCULAR | Status: DC | PRN
  Administered 2022-05-16 – 2022-05-17 (×5): 1 mg via INTRAVENOUS
  Filled 2022-05-16 (×5): qty 1

## 2022-05-16 MED ORDER — ACETAMINOPHEN 325 MG PO TABS
325.0000 mg | ORAL_TABLET | Freq: Four times a day (QID) | ORAL | Status: DC | PRN
  Filled 2022-05-16: qty 2

## 2022-05-16 MED ORDER — POTASSIUM CHLORIDE IN NACL 20-0.9 MEQ/L-% IV SOLN
INTRAVENOUS | Status: DC
  Filled 2022-05-16: qty 1000

## 2022-05-16 MED ORDER — GLYCOPYRROLATE 0.2 MG/ML IJ SOLN
INTRAMUSCULAR | Status: DC | PRN
  Administered 2022-05-16: .2 mg via INTRAVENOUS

## 2022-05-16 MED ORDER — VITAMIN D 25 MCG (1000 UNIT) PO TABS
2000.0000 [IU] | ORAL_TABLET | Freq: Two times a day (BID) | ORAL | Status: DC
  Administered 2022-05-16 – 2022-05-18 (×5): 2000 [IU] via ORAL
  Filled 2022-05-16 (×5): qty 2

## 2022-05-16 MED ORDER — KETOROLAC TROMETHAMINE 15 MG/ML IJ SOLN
15.0000 mg | Freq: Four times a day (QID) | INTRAMUSCULAR | Status: DC
  Administered 2022-05-16 – 2022-05-17 (×6): 15 mg via INTRAVENOUS
  Filled 2022-05-16 (×7): qty 1

## 2022-05-16 MED ORDER — ROCURONIUM BROMIDE 10 MG/ML (PF) SYRINGE
PREFILLED_SYRINGE | INTRAVENOUS | Status: AC
  Filled 2022-05-16: qty 10

## 2022-05-16 MED ORDER — CEFAZOLIN SODIUM-DEXTROSE 2-4 GM/100ML-% IV SOLN
2.0000 g | INTRAVENOUS | Status: DC
  Filled 2022-05-16: qty 100

## 2022-05-16 MED ORDER — ZINC SULFATE 220 (50 ZN) MG PO CAPS
220.0000 mg | ORAL_CAPSULE | Freq: Every day | ORAL | Status: DC
  Administered 2022-05-16 – 2022-05-18 (×3): 220 mg via ORAL
  Filled 2022-05-16 (×3): qty 1

## 2022-05-16 MED ORDER — GLYCOPYRROLATE PF 0.2 MG/ML IJ SOSY
PREFILLED_SYRINGE | INTRAMUSCULAR | Status: AC
  Filled 2022-05-16: qty 1

## 2022-05-16 MED ORDER — FENTANYL CITRATE (PF) 250 MCG/5ML IJ SOLN
INTRAMUSCULAR | Status: DC | PRN
  Administered 2022-05-16 (×2): 100 ug via INTRAVENOUS
  Administered 2022-05-16: 50 ug via INTRAVENOUS

## 2022-05-16 MED ORDER — LACTATED RINGERS IV SOLN: INTRAVENOUS | Status: DC | PRN

## 2022-05-16 MED ORDER — PROPOFOL 10 MG/ML IV BOLUS
INTRAVENOUS | Status: DC | PRN
  Administered 2022-05-16: 200 mg via INTRAVENOUS

## 2022-05-16 MED ORDER — ONDANSETRON HCL 4 MG/2ML IJ SOLN
INTRAMUSCULAR | Status: AC
  Filled 2022-05-16: qty 2

## 2022-05-16 MED ORDER — POLYETHYLENE GLYCOL 3350 17 G PO PACK
17.0000 g | PACK | Freq: Every day | ORAL | Status: DC
  Administered 2022-05-16 – 2022-05-17 (×2): 17 g via ORAL
  Filled 2022-05-16 (×3): qty 1

## 2022-05-16 MED ORDER — LIDOCAINE 2% (20 MG/ML) 5 ML SYRINGE
INTRAMUSCULAR | Status: AC
  Filled 2022-05-16: qty 5

## 2022-05-16 MED ORDER — MELOXICAM 7.5 MG PO TABS
15.0000 mg | ORAL_TABLET | Freq: Once | ORAL | Status: AC
  Administered 2022-05-16: 15 mg via ORAL
  Filled 2022-05-16: qty 1
  Filled 2022-05-16: qty 2
  Filled 2022-05-16: qty 1

## 2022-05-16 MED ORDER — MIDAZOLAM HCL 2 MG/2ML IJ SOLN
0.5000 mg | Freq: Once | INTRAMUSCULAR | Status: AC | PRN
  Administered 2022-05-16: 1 mg via INTRAVENOUS

## 2022-05-16 MED ORDER — BISACODYL 5 MG PO TBEC: 5.0000 mg | DELAYED_RELEASE_TABLET | Freq: Every day | ORAL | Status: DC | PRN

## 2022-05-16 MED ORDER — OXYCODONE HCL 5 MG PO TABS: 5.0000 mg | ORAL_TABLET | Freq: Once | ORAL | Status: DC | PRN

## 2022-05-16 MED ORDER — CEFAZOLIN SODIUM-DEXTROSE 1-4 GM/50ML-% IV SOLN
1.0000 g | Freq: Four times a day (QID) | INTRAVENOUS | Status: AC
  Administered 2022-05-16 – 2022-05-17 (×3): 1 g via INTRAVENOUS
  Filled 2022-05-16 (×3): qty 50

## 2022-05-16 MED ORDER — MIDAZOLAM HCL 2 MG/2ML IJ SOLN
INTRAMUSCULAR | Status: DC | PRN
  Administered 2022-05-16: 2 mg via INTRAVENOUS

## 2022-05-16 MED ORDER — ACETAMINOPHEN 500 MG PO TABS
1000.0000 mg | ORAL_TABLET | Freq: Once | ORAL | Status: AC
  Administered 2022-05-16: 1000 mg via ORAL
  Filled 2022-05-16: qty 2

## 2022-05-16 MED ORDER — PHENYLEPHRINE HCL-NACL 20-0.9 MG/250ML-% IV SOLN
INTRAVENOUS | Status: DC | PRN
  Administered 2022-05-16: 50 ug/min via INTRAVENOUS

## 2022-05-16 MED ORDER — LACTATED RINGERS IV SOLN: INTRAVENOUS | Status: DC

## 2022-05-16 MED ORDER — ONDANSETRON HCL 4 MG/2ML IJ SOLN
INTRAMUSCULAR | Status: DC | PRN
  Administered 2022-05-16: 4 mg via INTRAVENOUS

## 2022-05-16 MED ORDER — METOCLOPRAMIDE HCL 5 MG/ML IJ SOLN: 5.0000 mg | Freq: Three times a day (TID) | INTRAMUSCULAR | Status: DC | PRN

## 2022-05-16 MED ORDER — METHOCARBAMOL 1000 MG/10ML IJ SOLN
500.0000 mg | Freq: Three times a day (TID) | INTRAVENOUS | Status: DC
  Filled 2022-05-16: qty 5

## 2022-05-16 MED ORDER — DEXAMETHASONE SODIUM PHOSPHATE 10 MG/ML IJ SOLN
INTRAMUSCULAR | Status: AC
  Filled 2022-05-16: qty 1

## 2022-05-16 MED ORDER — 0.9 % SODIUM CHLORIDE (POUR BTL) OPTIME
TOPICAL | Status: DC | PRN
  Administered 2022-05-16: 1000 mL

## 2022-05-16 MED ORDER — HYDROMORPHONE HCL 1 MG/ML IJ SOLN
INTRAMUSCULAR | Status: DC | PRN
  Administered 2022-05-16: .5 mg via INTRAVENOUS

## 2022-05-16 MED ORDER — ONDANSETRON HCL 4 MG PO TABS: 4.0000 mg | ORAL_TABLET | Freq: Four times a day (QID) | ORAL | Status: DC | PRN

## 2022-05-16 MED ORDER — ACETAMINOPHEN 500 MG PO TABS: 1000.0000 mg | ORAL_TABLET | Freq: Once | ORAL | Status: DC

## 2022-05-16 MED ORDER — ACETAMINOPHEN 325 MG PO TABS: 325.0000 mg | ORAL_TABLET | Freq: Four times a day (QID) | ORAL | Status: DC | PRN

## 2022-05-16 MED ORDER — GABAPENTIN 300 MG PO CAPS
300.0000 mg | ORAL_CAPSULE | Freq: Once | ORAL | Status: AC
  Administered 2022-05-16: 300 mg via ORAL
  Filled 2022-05-16: qty 1

## 2022-05-16 MED ORDER — PANTOPRAZOLE SODIUM 40 MG PO TBEC
40.0000 mg | DELAYED_RELEASE_TABLET | Freq: Every day | ORAL | Status: DC
  Administered 2022-05-16 – 2022-05-18 (×3): 40 mg via ORAL
  Filled 2022-05-16 (×3): qty 1

## 2022-05-16 MED ORDER — VITAMIN D (ERGOCALCIFEROL) 1.25 MG (50000 UNIT) PO CAPS
50000.0000 [IU] | ORAL_CAPSULE | ORAL | Status: DC
  Administered 2022-05-16: 50000 [IU] via ORAL
  Filled 2022-05-16: qty 1

## 2022-05-16 MED ORDER — VITAMIN C 500 MG PO TABS
1000.0000 mg | ORAL_TABLET | Freq: Every day | ORAL | Status: DC
  Administered 2022-05-16 – 2022-05-18 (×3): 1000 mg via ORAL
  Filled 2022-05-16 (×3): qty 2

## 2022-05-16 SURGICAL SUPPLY — 95 items
BAG COUNTER SPONGE SURGICOUNT (BAG) ×1 IMPLANT
BANDAGE ESMARK 6X9 LF (GAUZE/BANDAGES/DRESSINGS) ×1 IMPLANT
BIT DRILL CAL (BIT) IMPLANT
BIT DRILL CALIBRATED 2.7 (BIT) IMPLANT
BLADE CLIPPER SURG (BLADE) IMPLANT
BLADE SURG 10 STRL SS (BLADE) ×1 IMPLANT
BLADE SURG 15 STRL LF DISP TIS (BLADE) ×1 IMPLANT
BLADE SURG 15 STRL SS (BLADE) ×1
BNDG COHESIVE 4X5 TAN STRL (GAUZE/BANDAGES/DRESSINGS) ×1 IMPLANT
BNDG ELASTIC 4X5.8 VLCR STR LF (GAUZE/BANDAGES/DRESSINGS) ×1 IMPLANT
BNDG ELASTIC 6X5.8 VLCR STR LF (GAUZE/BANDAGES/DRESSINGS) ×1 IMPLANT
BNDG ESMARK 6X9 LF (GAUZE/BANDAGES/DRESSINGS)
BNDG GAUZE DERMACEA FLUFF 4 (GAUZE/BANDAGES/DRESSINGS) ×1 IMPLANT
BONE CANC CHIPS 40CC CAN1/2 (Bone Implant) ×1 IMPLANT
BRUSH SCRUB EZ PLAIN DRY (MISCELLANEOUS) ×2 IMPLANT
CANISTER SUCT 3000ML PPV (MISCELLANEOUS) ×1 IMPLANT
CANISTER WOUNDNEG PRESSURE 500 (CANNISTER) IMPLANT
CHIPS CANC BONE 40CC CAN1/2 (Bone Implant) ×1 IMPLANT
COVER SURGICAL LIGHT HANDLE (MISCELLANEOUS) ×1 IMPLANT
CUFF TOURN SGL QUICK 34 (TOURNIQUET CUFF)
CUFF TRNQT CYL 34X4.125X (TOURNIQUET CUFF) ×1 IMPLANT
DRAPE C-ARM 42X72 X-RAY (DRAPES) ×1 IMPLANT
DRAPE C-ARMOR (DRAPES) ×1 IMPLANT
DRAPE HALF SHEET 40X57 (DRAPES) IMPLANT
DRAPE INCISE IOBAN 66X45 STRL (DRAPES) ×1 IMPLANT
DRAPE U-SHAPE 47X51 STRL (DRAPES) ×1 IMPLANT
DRESSING MEPILEX FLEX 4X4 (GAUZE/BANDAGES/DRESSINGS) IMPLANT
DRESSING PREVENA PLUS CUSTOM (GAUZE/BANDAGES/DRESSINGS) IMPLANT
DRILL BIT CAL (BIT) ×1
DRSG ADAPTIC 3X8 NADH LF (GAUZE/BANDAGES/DRESSINGS) ×1 IMPLANT
DRSG MEPILEX FLEX 4X4 (GAUZE/BANDAGES/DRESSINGS) ×1
DRSG PREVENA PLUS CUSTOM (GAUZE/BANDAGES/DRESSINGS) ×1
ELECT REM PT RETURN 9FT ADLT (ELECTROSURGICAL) ×1
ELECTRODE REM PT RTRN 9FT ADLT (ELECTROSURGICAL) ×1 IMPLANT
GAUZE PAD ABD 8X10 STRL (GAUZE/BANDAGES/DRESSINGS) ×2 IMPLANT
GAUZE SPONGE 4X4 12PLY STRL (GAUZE/BANDAGES/DRESSINGS) ×1 IMPLANT
GAUZE SPONGE 4X4 12PLY STRL LF (GAUZE/BANDAGES/DRESSINGS) IMPLANT
GLOVE BIO SURGEON STRL SZ7.5 (GLOVE) ×1 IMPLANT
GLOVE BIO SURGEON STRL SZ8 (GLOVE) ×1 IMPLANT
GLOVE BIOGEL PI IND STRL 7.5 (GLOVE) ×1 IMPLANT
GLOVE BIOGEL PI IND STRL 8 (GLOVE) ×1 IMPLANT
GLOVE SURG ORTHO LTX SZ7.5 (GLOVE) ×2 IMPLANT
GOWN STRL REUS W/ TWL LRG LVL3 (GOWN DISPOSABLE) ×2 IMPLANT
GOWN STRL REUS W/ TWL XL LVL3 (GOWN DISPOSABLE) ×1 IMPLANT
GOWN STRL REUS W/TWL LRG LVL3 (GOWN DISPOSABLE) ×1
GOWN STRL REUS W/TWL XL LVL3 (GOWN DISPOSABLE) ×1
GRAFT BNE CHIP CANC 1-8 40 (Bone Implant) IMPLANT
IMMOBILIZER KNEE 22 UNIV (SOFTGOODS) ×1 IMPLANT
K-WIRE ACE 1.6X6 (WIRE) ×7
KIT BASIN OR (CUSTOM PROCEDURE TRAY) ×1 IMPLANT
KIT DRSG PREVENA PLUS 7DAY 125 (MISCELLANEOUS) IMPLANT
KIT TURNOVER KIT B (KITS) ×1 IMPLANT
KWIRE ACE 1.6X6 (WIRE) IMPLANT
NDL 1/2 CIR CATGUT .05X1.09 (NEEDLE) IMPLANT
NDL SUT 6 .5 CRC .975X.05 MAYO (NEEDLE) IMPLANT
NEEDLE 1/2 CIR CATGUT .05X1.09 (NEEDLE) ×1 IMPLANT
NEEDLE MAYO TAPER (NEEDLE)
NS IRRIG 1000ML POUR BTL (IV SOLUTION) ×1 IMPLANT
PACK ORTHO EXTREMITY (CUSTOM PROCEDURE TRAY) ×1 IMPLANT
PAD ARMBOARD 7.5X6 YLW CONV (MISCELLANEOUS) ×2 IMPLANT
PAD CAST 4YDX4 CTTN HI CHSV (CAST SUPPLIES) ×1 IMPLANT
PADDING CAST COTTON 4X4 STRL (CAST SUPPLIES) ×1
PADDING CAST COTTON 6X4 STRL (CAST SUPPLIES) ×1 IMPLANT
PLATE LOCK 7H STD LT PROX TIB (Plate) IMPLANT
SCREW CORTICAL 3.5MM  42MM (Screw) ×1 IMPLANT
SCREW CORTICAL 3.5MM  44MM (Screw) ×1 IMPLANT
SCREW CORTICAL 3.5MM 38MM (Screw) IMPLANT
SCREW CORTICAL 3.5MM 40MM (Screw) IMPLANT
SCREW CORTICAL 3.5MM 42MM (Screw) IMPLANT
SCREW CORTICAL 3.5MM 44MM (Screw) IMPLANT
SCREW CORTICAL 3.5MM 48MM (Screw) IMPLANT
SCREW LOCK 3.5X80 DIST TIB (Screw) IMPLANT
SCREW LOCK 3.5X85 DIST TIB (Screw) IMPLANT
SCREW LOCK CORT STAR 3.5X80 (Screw) IMPLANT
SCREW LOCK CORT STAR 3.5X85 (Screw) IMPLANT
SPONGE T-LAP 18X18 ~~LOC~~+RFID (SPONGE) ×1 IMPLANT
STAPLER VISISTAT 35W (STAPLE) ×1 IMPLANT
STOCKINETTE IMPERVIOUS LG (DRAPES) ×1 IMPLANT
SUCTION FRAZIER HANDLE 10FR (MISCELLANEOUS) ×1
SUCTION TUBE FRAZIER 10FR DISP (MISCELLANEOUS) ×1 IMPLANT
SUT ETHILON 2 0 FS 18 (SUTURE) IMPLANT
SUT ETHILON 2 0 PSLX (SUTURE) IMPLANT
SUT PROLENE 0 CT 2 (SUTURE) ×2 IMPLANT
SUT VIC AB 0 CT1 27 (SUTURE) ×1
SUT VIC AB 0 CT1 27XBRD ANBCTR (SUTURE) ×1 IMPLANT
SUT VIC AB 1 CT1 27 (SUTURE) ×1
SUT VIC AB 1 CT1 27XBRD ANBCTR (SUTURE) ×1 IMPLANT
SUT VIC AB 2-0 CT1 27 (SUTURE) ×2
SUT VIC AB 2-0 CT1 TAPERPNT 27 (SUTURE) ×2 IMPLANT
TOWEL GREEN STERILE (TOWEL DISPOSABLE) ×2 IMPLANT
TOWEL GREEN STERILE FF (TOWEL DISPOSABLE) ×1 IMPLANT
TRAY FOLEY MTR SLVR 16FR STAT (SET/KITS/TRAYS/PACK) IMPLANT
TUBE CONNECTING 12X1/4 (SUCTIONS) ×1 IMPLANT
WATER STERILE IRR 1000ML POUR (IV SOLUTION) ×2 IMPLANT
YANKAUER SUCT BULB TIP NO VENT (SUCTIONS) ×1 IMPLANT

## 2022-05-16 NOTE — Progress Notes (Signed)
Pt arrived to room 5N11 via bed after surgery. Received report from [place], [place] in PACU. See assessment. Will continue to monitor.

## 2022-05-16 NOTE — Progress Notes (Signed)
Orthopedic Tech Progress Note Patient Details:  Javad Salva 1976/07/08 [id number]  ROM knee brace order communicated to [facility] with [facility] who will be delivering the brace.  Patient ID: Corran Lalone, male   DOB: April 27, 1976, 46 y.o.   MRN: [id number]  Carin Primrose 05/16/2022, 3:45 PM
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
## 2022-05-16 NOTE — Transfer of Care (Signed)
Immediate Anesthesia Transfer of Care Note  Patient: Keith Alvarado  Procedure(s) Performed: OPEN REDUCTION INTERNAL FIXATION (ORIF) TIBIAL PLATEAU (Left: Leg Lower)  Patient Location: PACU  Anesthesia Type:General  Level of Consciousness: awake, alert , oriented and patient cooperative  Airway & Oxygen Therapy: Patient Spontanous Breathing  Post-op Assessment: Report given to RN, Post -op Vital signs reviewed and stable and Patient moving all extremities X 4  Post vital signs: Reviewed and stable  Last Vitals:  Vitals Value Taken Time  BP 167/101 05/16/22 1123  Temp    Pulse 111 05/16/22 1125  Resp 18 05/16/22 1125  SpO2 97 % 05/16/22 1125  Vitals shown include unvalidated device data.  Last Pain:  Vitals:   05/16/22 0716  TempSrc:   PainSc: 8       Patients Stated Pain Goal: 0 (00/86/76 1950)  Complications: No notable events documented.

## 2022-05-16 NOTE — Anesthesia Postprocedure Evaluation (Signed)
Anesthesia Post Note  Patient: Keith Alvarado  Procedure(s) Performed: OPEN REDUCTION INTERNAL FIXATION (ORIF) TIBIAL PLATEAU (Left: Leg Lower)     Patient location during evaluation: PACU Anesthesia Type: General Level of consciousness: awake and alert, patient cooperative and oriented Pain management: pain level controlled (continuing analgesia meds) Vital Signs Assessment: post-procedure vital signs reviewed and stable Respiratory status: spontaneous breathing, nonlabored ventilation and respiratory function stable Cardiovascular status: blood pressure returned to baseline and stable Postop Assessment: no apparent nausea or vomiting Anesthetic complications: no   No notable events documented.  Last Vitals:  Vitals:   05/16/22 1200 05/16/22 1215  BP: (!) 137/98 (!) 150/92  Pulse: (!) 110 (!) 114  Resp: 19 (!) 21  Temp:    SpO2: 92% 94%    Last Pain:  Vitals:   05/16/22 1215  TempSrc:   PainSc: 7                  Shayli Altemose,E. Vickey Ewbank
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
## 2022-05-16 NOTE — Anesthesia Procedure Notes (Signed)
Procedure Name: Intubation Date/Time: 05/16/2022 8:31 AM  Performed by: Claris Che, CRNAPre-anesthesia Checklist: Patient identified, Emergency Drugs available, Suction available, Patient being monitored and Timeout performed Patient Re-evaluated:Patient Re-evaluated prior to induction Oxygen Delivery Method: Circle system utilized Preoxygenation: Pre-oxygenation with 100% oxygen Induction Type: IV induction and Cricoid Pressure applied Ventilation: Mask ventilation without difficulty Laryngoscope Size: Mac and 4 Grade View: Grade I Tube type: Oral Tube size: 8.0 mm Number of attempts: 1 Airway Equipment and Method: Stylet Placement Confirmation: ETT inserted through vocal cords under direct vision and breath sounds checked- equal and bilateral Secured at: 22 cm Tube secured with: Tape Dental Injury: Teeth and Oropharynx as per pre-operative assessment

## 2022-05-16 NOTE — Plan of Care (Signed)
  Problem: Pain Managment: Goal: General experience of comfort will improve Outcome: Progressing   Problem: Safety: Goal: Ability to remain free from injury will improve Outcome: Progressing   Problem: Skin Integrity: Goal: Risk for impaired skin integrity will decrease Outcome: Progressing   

## 2022-05-17 ENCOUNTER — Other Ambulatory Visit (HOSPITAL_COMMUNITY): Payer: Self-pay

## 2022-05-17 ENCOUNTER — Encounter (HOSPITAL_COMMUNITY): Payer: Self-pay | Admitting: Orthopedic Surgery

## 2022-05-17 DIAGNOSIS — E559 Vitamin D deficiency, unspecified: Secondary | ICD-10-CM

## 2022-05-17 HISTORY — DX: Vitamin D deficiency, unspecified: E55.9

## 2022-05-17 LAB — CBC
HCT: 31.5 % — ABNORMAL LOW (ref 39.0–52.0)
Hemoglobin: 11 g/dL — ABNORMAL LOW (ref 13.0–17.0)
MCH: 34.8 pg — ABNORMAL HIGH (ref 26.0–34.0)
MCHC: 34.9 g/dL (ref 30.0–36.0)
MCV: 99.7 fL (ref 80.0–100.0)
Platelets: 358 10*3/uL (ref 150–400)
RBC: 3.16 MIL/uL — ABNORMAL LOW (ref 4.22–5.81)
RDW: 12.5 % (ref 11.5–15.5)
WBC: 16.4 10*3/uL — ABNORMAL HIGH (ref 4.0–10.5)
nRBC: 0 % (ref 0.0–0.2)

## 2022-05-17 LAB — BASIC METABOLIC PANEL
Anion gap: 6 (ref 5–15)
BUN: 9 mg/dL (ref 6–20)
CO2: 29 mmol/L (ref 22–32)
Calcium: 8.9 mg/dL (ref 8.9–10.3)
Chloride: 100 mmol/L (ref 98–111)
Creatinine, Ser: 0.85 mg/dL (ref 0.61–1.24)
GFR, Estimated: >60 mL/min (ref >60)
Glucose, Bld: 110 mg/dL — ABNORMAL HIGH (ref 70–99)
Potassium: 3.9 mmol/L (ref 3.5–5.1)
Sodium: 135 mmol/L (ref 135–145)

## 2022-05-17 MED ORDER — KETOROLAC TROMETHAMINE 10 MG PO TABS: 10.0000 mg | ORAL_TABLET | Freq: Four times a day (QID) | ORAL | 0 refills | Status: DC | PRN

## 2022-05-17 MED ORDER — METHOCARBAMOL 500 MG PO TABS
500.0000 mg | ORAL_TABLET | Freq: Four times a day (QID) | ORAL | 0 refills | Status: DC | PRN
  Filled 2022-05-17: qty 120, 15d supply, fill #0

## 2022-05-17 MED ORDER — CHOLECALCIFEROL 125 MCG (5000 UT) PO TABS
ORAL_TABLET | Freq: Every day | ORAL | 6 refills | Status: AC
  Filled 2022-05-17: qty 30, 30d supply, fill #0

## 2022-05-17 MED ORDER — CALCIUM CITRATE 950 (200 CA) MG PO TABS
200.0000 mg | ORAL_TABLET | Freq: Two times a day (BID) | ORAL | 1 refills | Status: DC
  Filled 2022-05-17: qty 60, 30d supply, fill #0

## 2022-05-17 MED ORDER — APIXABAN 2.5 MG PO TABS
2.5000 mg | ORAL_TABLET | Freq: Two times a day (BID) | ORAL | 0 refills | Status: DC
  Filled 2022-05-17: qty 60, 30d supply, fill #0

## 2022-05-17 MED ORDER — DOCUSATE SODIUM 100 MG PO CAPS
100.0000 mg | ORAL_CAPSULE | Freq: Two times a day (BID) | ORAL | 0 refills | Status: DC
  Filled 2022-05-17: qty 20, 10d supply, fill #0

## 2022-05-17 MED ORDER — ZINC SULFATE 220 (50 ZN) MG PO TABS
220.0000 mg | ORAL_TABLET | Freq: Every day | ORAL | 1 refills | Status: DC
  Filled 2022-05-17: qty 30, 30d supply, fill #0

## 2022-05-17 MED ORDER — VITAMIN D (ERGOCALCIFEROL) 1.25 MG (50000 UNIT) PO CAPS
50000.0000 [IU] | ORAL_CAPSULE | ORAL | 0 refills | Status: DC
  Filled 2022-05-17: qty 8, 56d supply, fill #0

## 2022-05-17 MED ORDER — ASCORBIC ACID 1000 MG PO TABS
1000.0000 mg | ORAL_TABLET | Freq: Every day | ORAL | 1 refills | Status: AC
  Filled 2022-05-17: qty 30, 30d supply, fill #0
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
## 2022-05-17 NOTE — Progress Notes (Signed)
Orthopedic Tech Progress Note Patient Details:  Keith Alvarado 02/07/1976 [id number]  Ortho Devices Type of Ortho Device: Bone foam zero knee Ortho Device/Splint Location: LLE Ortho Device/Splint Interventions: Ordered   Post Interventions Patient Tolerated: Well Instructions Provided: Care of device Bone foam dropped off with patient and family member and instructions on usage provided. Vernona Rieger 05/17/2022, 6:41 PM
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
## 2022-05-17 NOTE — Progress Notes (Signed)
Mobility Specialist Progress Note   05/17/22 1222  Mobility  Activity Ambulated with assistance in hallway  Level of Assistance Standby assist, set-up cues, supervision of patient - no hands on  Assistive Device Crutches  LLE Weight Bearing NWB  Distance Ambulated (ft) 260 ft  Activity Response Tolerated well  $Mobility charge 1 Mobility   Received pt in chair having no complaints and agreeable to mobility. Pt was asymptomatic throughout ambulation and maintaining WB precautions well. Returned to room w/o fault and left in chair w/ call bell in reach and all needs met.  Holland Falling Mobility Specialist [facility] #:  [phone] Acute Rehab Office:  [phone]
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
## 2022-05-18 LAB — CBC
HCT: 32.3 % — ABNORMAL LOW (ref 39.0–52.0)
Hemoglobin: 10.7 g/dL — ABNORMAL LOW (ref 13.0–17.0)
MCH: 34 pg (ref 26.0–34.0)
MCHC: 33.1 g/dL (ref 30.0–36.0)
MCV: 102.5 fL — ABNORMAL HIGH (ref 80.0–100.0)
Platelets: 328 10*3/uL (ref 150–400)
RBC: 3.15 MIL/uL — ABNORMAL LOW (ref 4.22–5.81)
RDW: 12.8 % (ref 11.5–15.5)
WBC: 12.1 10*3/uL — ABNORMAL HIGH (ref 4.0–10.5)
nRBC: 0 % (ref 0.0–0.2)

## 2022-05-18 NOTE — Plan of Care (Signed)
  Problem: Clinical Measurements: Goal: Will remain free from infection Outcome: Progressing   Problem: Pain Managment: Goal: General experience of comfort will improve Outcome: Progressing   Problem: Safety: Goal: Ability to remain free from injury will improve Outcome: Progressing   
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
## 2022-05-18 NOTE — Progress Notes (Signed)
  Transition of Care [facility]) Screening Note   Patient Details  Name: Keith Alvarado Date of Birth: August 19, 1976   Transition of Care [facility]) CM/SW Contact:    Bartholomew Crews, RN Phone Number: [phone] 05/18/2022, 9:22 AM    Transition of Care Department [facility]) has reviewed patient and no TOC needs have been identified at this time. We will continue to monitor patient advancement through interdisciplinary progression rounds. If new patient transition needs arise, please place a TOC consult.

## 2022-05-18 NOTE — TOC Transition Note (Signed)
Transition of Care [facility]) - CM/SW Discharge Note   Patient Details  Name: Keith Alvarado MRN: [id number] Date of Birth: [birth year]  Transition of Care [facility]) CM/SW Contact:  Bartholomew Crews, RN Phone Number: [phone] 05/18/2022, 4:10 PM   Clinical Narrative:     Spoke with patient on hospital room phone to discuss post acute transition needs. Advised that PT/OT recommended no outpatient f/u. Discussesd barriers r/t insurance payor. Patient has prevena wound vac which does not need dressing changes - MD will remove at outpatient f/u appointment. Discussed PCP needed - provided contact information for [place]. No further TOC needs identified at this time.   Final next level of care: Home/Self Care Barriers to Discharge: No Barriers Identified   Patient Goals and CMS Choice        Discharge Placement                       Discharge Plan and Services   Discharge Planning Services: CM Consult                                 Social Determinants of Health (SDOH) Interventions     Readmission Risk Interventions     No data to display

## 2022-05-20 ENCOUNTER — Other Ambulatory Visit (HOSPITAL_COMMUNITY): Payer: Self-pay

## 2022-07-02 ENCOUNTER — Ambulatory Visit: Payer: Medicaid Other | Attending: Orthopedic Surgery | Admitting: Physical Therapy

## 2022-07-02 ENCOUNTER — Other Ambulatory Visit: Payer: Self-pay

## 2022-07-02 ENCOUNTER — Encounter: Payer: Self-pay | Admitting: Physical Therapy

## 2022-07-02 DIAGNOSIS — M25562 Pain in left knee: Secondary | ICD-10-CM | POA: Diagnosis present

## 2022-07-02 DIAGNOSIS — M6281 Muscle weakness (generalized): Secondary | ICD-10-CM | POA: Diagnosis present

## 2022-07-02 DIAGNOSIS — R262 Difficulty in walking, not elsewhere classified: Secondary | ICD-10-CM | POA: Insufficient documentation
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
## 2022-07-10 ENCOUNTER — Ambulatory Visit: Payer: Medicaid Other | Admitting: Physical Therapy

## 2022-07-10 ENCOUNTER — Ambulatory Visit: Payer: Medicaid Other

## 2022-07-10 DIAGNOSIS — M6281 Muscle weakness (generalized): Secondary | ICD-10-CM

## 2022-07-10 DIAGNOSIS — M25562 Pain in left knee: Secondary | ICD-10-CM

## 2022-07-10 DIAGNOSIS — R262 Difficulty in walking, not elsewhere classified: Secondary | ICD-10-CM

## 2022-07-10 NOTE — Therapy (Signed)
OUTPATIENT PHYSICAL THERAPY TREATMENT NOTE   Patient Name: Keith Alvarado MRN: [id number] DOB:[birth year], 46 y.o., male Today's Date: 07/10/2022  PCP: none REFERRING PROVIDER: Montez Morita, PA-C    END OF SESSION:   PT End of Session - 07/10/22 1627     Visit Number 2    Number of Visits 16    Date for PT Re-Evaluation 08/27/22    Authorization Type [place] [facility] access    Authorization Time Period 11/15-11/28/23    Authorization - Visit Number 1    Authorization - Number of Visits 3    PT Start Time 1628   patient late   PT Stop Time 1711    PT Time Calculation (min) 43 min    Equipment Utilized During Treatment Left knee immobilizer    Activity Tolerance Patient tolerated treatment well    Behavior During Therapy [facility] for tasks assessed/performed             Past Medical History:  Diagnosis Date   Medical history non-contributory    Vitamin D deficiency 05/17/2022   Past Surgical History:  Procedure Laterality Date   FOOT SURGERY Left    JOINT REPLACEMENT Right 2021   ORIF TIBIA PLATEAU Left 05/16/2022   Procedure: OPEN REDUCTION INTERNAL FIXATION (ORIF) TIBIAL PLATEAU;  Surgeon: Myrene Galas, MD;  Location: [facility] OR;  Service: Orthopedics;  Laterality: Left;   TOTAL HIP ARTHROPLASTY Right 07/17/2020   Procedure: RIGHT TOTAL HIP ARTHROPLASTY ANTERIOR APPROACH;  Surgeon: Gean Birchwood, MD;  Location: [facility] ORS;  Service: Orthopedics;  Laterality: Right;   TOTAL HIP ARTHROPLASTY Left 12/11/2020   Procedure: LEFT TOTAL HIP ARTHROPLASTY ANTERIOR APPROACH;  Surgeon: Gean Birchwood, MD;  Location: [facility];  Service: Orthopedics;  Laterality: Left;   Patient Active Problem List   Diagnosis Date Noted   Vitamin D deficiency 05/17/2022   Closed fracture of lateral portion of left tibial plateau 05/16/2022   S/P hip replacement, left 12/11/2020   AVN (avascular necrosis of bone) (HCC) 12/06/2020   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 Brace for 2 weeks and then WBAT for 2 weeks in brace before weaning.  Wean from brace after November 28th.   SUBJECTIVE:  SUBJECTIVE STATEMENT:  Patient reports he is doing ok. He has been wearing his brace and completing his exercises.    PAIN:  Are you having pain? Yes: NPRS scale: 8/10 Pain location: Lt knee Pain description: ache Aggravating factors: movement Relieving factors: medicine   OBJECTIVE: (objective measures completed at initial evaluation unless otherwise dated)  DIAGNOSTIC FINDINGS:  05-16-22 There is internal fixation of comminuted fracture in the medial tibial plateau with metallic plate and surgical screws. There is is surgical drain in the soft tissues along the lateral aspect. There is possible minimal effusion in suprapatellar bursa.   IMPRESSION: Status post internal fixation of comminuted fracture of lateral plateau of left tibia.     PATIENT SURVEYS:  LEFS 21/80  26.3% disability   COGNITION: Overall cognitive status: Within functional limits for tasks assessed                         SENSATION: Numbness on top of ankle and down Left shin bone   EDEMA:  Circumferential: Left knee 43 cm  R 39 cm   MUSCLE LENGTH: Hamstrings: Right 74 deg; Left 60 deg     POSTURE: rounded shoulders, forward head, and tall and within normal BMI   PALPATION: TTP over medial knee down into shin and on top of ankle    LOWER EXTREMITY ROM:   Active ROM Right eval Left eval 07/10/22  Hip flexion     Left   Hip extension       Hip abduction       Hip adduction       Hip internal rotation       Hip external rotation       Knee flexion 135/P139 105/P109 104  Knee extension 0/+4 hyperext 12deficit/P 10 deficit   Ankle dorsiflexion 9 -11defict/stiff   Ankle plantarflexion       Ankle inversion       Ankle eversion        (Blank rows = not tested)   LOWER EXTREMITY MMT:   MMT Right eval Left eval  Hip flexion 5 5  Hip extension      Hip abduction 5    Hip adduction      Hip internal rotation      Hip external rotation      Knee flexion 5 3-  Knee extension 5 3-   Ankle dorsiflexion 5    Ankle plantarflexion      Ankle inversion      Ankle eversion       (Blank rows = not tested)     FUNCTIONAL TESTS:  5 times sit to stand: 17.34 sec and Wt bears to Right   GAIT: Distance walked: 250 ft  Assistive device utilized: Crutches Level of assistance: Complete Independence Comments: Pt able to bear wt in Left LE but wt bears to right.  Pt without brace as per MD order and did not attempt stairs at eval   [facility] Adult PT Treatment:                                                DATE: 07/10/22 Therapeutic Exercise: Long sit calf stretch x 60 seconds Heel slides 1 x 10 with strap; 5 seconds  SLR 2 x 8 partial range  Manual Therapy: Lt knee PROM flexion/extension to tolerance  Patellar mobilizations all planes grade II-III LLE  Gait training: With single axillary crutch and SPC focusing on heel strike and increasing step length Without AD, antalgic gait present, so d/c   Modalities: Ice pack to Lt knee x 10 minutes        PATIENT EDUCATION:  Education details: continue HEP Person educated: Patient Education method: Explanation Education comprehension: verbalized understanding   HOME EXERCISE PROGRAM: Access Code: [email or web address] URL: [email or web address] Date:  07/02/2022 Prepared by: Garen Lah   Exercises - Supine active Straight Leg Raise in Brace until July 16, 2022  - 1 x daily - 7 x weekly - 3 sets - 10 reps - Long Sitting Quad Set with Towel Roll Under Heel  - 1 x daily - 7 x weekly - 3 sets - 10 reps - Supine Heel Slide  - 1 x daily - 7 x weekly - 3 sets - 10 reps - Seated Calf Stretch with Strap  - 1 x daily - 7 x weekly - 3 sets - 3 reps - 30-60-sec hold   ASSESSMENT:   CLINICAL IMPRESSION: Session somewhat limited as patient was late for scheduled visit. Focused on reviewing HEP and gait training today. He is able to complete SLR through partial range without quad lag, but quickly fatigues. Able to progress to single axillary crutch/SPC with gait training with patient demonstrating good stability with these devices. Without AD he demonstrates an antalgic gait, so was recommended that he utilize [facility] or single axillary crutch for ambulation at this time with patient verbalizing understanding. No changes made to HEP this visit. Knee flexion AROM remains relatively unchanged compared to initial evaluation. No reports of increased pain throughout session.      OBJECTIVE IMPAIRMENTS: difficulty walking, decreased ROM, decreased strength, increased edema, and pain.    ACTIVITY LIMITATIONS: standing, squatting, transfers, bathing, dressing, and hygiene/grooming   PARTICIPATION LIMITATIONS: community activity, occupation, and return to exercise and recreational golf /hiking   PERSONAL FACTORS: 1 comorbidity: BIL THA for AVN  are also affecting patient's functional outcome.    REHAB POTENTIAL: Excellent   CLINICAL DECISION MAKING: Evolving/moderate complexity   EVALUATION COMPLEXITY: Moderate     GOALS: Goals reviewed with patient? Yes   SHORT TERM GOALS: Target date: 07-23-22  Pt will be independent with initial HEP Baseline: no knowledge Goal status: INITIAL   2.  Pt will return to bed to sleep rather than sleeping in  recliner Baseline: eval must sleep in recliner Goal status: INITIAL   3.  Pain will decrease to 4/10 at worst/standing bending knee Baseline: eval pain up to 8/10 Goal status: INITIAL   4.  L knee AROM flexion will improve to 5-115 degrees for improved mobility to ride in car without increased pain Baseline: See AROM chart Goal status: INITIAL   5.  Pt will be able to stand with even wt distribution in bil LE Baseline: Pt wt bears to right Goal status: INITIAL   6.  Pt with DF of L will be at 0 neutral             Baseline  -11 from neutral            Goal Status: INITIAL     LONG TERM GOALS: Target date: 08/13/2022    Pt will be independent with advanced HEP Baseline: No knowledge Goal status: INITIAL   2.  Pt will be able to walk 2 miles for exercise Baseline: unable to stand for greater than 10 min Goal  status: INITIAL   3.  Pt will be able to get back to recreational pursuits, hiking , golf and kayaking Baseline: unable to participate due to knee surgery Goal status: INITIAL   4.  PT with be able to walk/stand >/= 1 hour with no AD with </= 2/10 pain for functional endurance and return to leisure activities  Baseline: Pt unable to stand for greater than 10 min with 6-8/10 pain Goal status: INITIAL   5.  Pt will be able to negotiate steps without any AD and with safe execution Baseline: unable at eval due to not having brace Goal status: INITIAL   6.  Patient will report improved functional level on LEFS >/= 70/80 in order to allow for return to prior exercise level   Baseline: LEFS 21/80 26.3 % disability Goal status: INITIAL     PLAN:   PT FREQUENCY: 2x/week   PT DURATION: 6 weeks   PLANNED INTERVENTIONS: Therapeutic exercises, Therapeutic activity, Neuromuscular re-education, Balance training, Gait training, Patient/Family education, Self Care, Joint mobilization, Stair training, DME instructions, Dry Needling, Electrical stimulation, Cryotherapy, Moist  heat, scar mobilization, Taping, Manual therapy, and Re-evaluation   PLAN FOR NEXT SESSION: Review HEP,  knee ROM and strengthening; gait training    Letitia LibraSamantha Rupal Childress, PT, DPT, ATC 07/10/22 5:07 PM

## 2022-07-15 ENCOUNTER — Ambulatory Visit: Payer: Medicaid Other | Admitting: Physical Therapy

## 2022-07-15 NOTE — Therapy (Incomplete)
OUTPATIENT PHYSICAL THERAPY TREATMENT NOTE   Patient Name: Keith Alvarado MRN: [id number] DOB:1976/04/03, 46 y.o., male Today's Date: 07/15/2022  PCP: none REFERRING PROVIDER: Montez MoritaPaul, Keith, PA-C    END OF SESSION:     Past Medical History:  Diagnosis Date   Medical history non-contributory    Vitamin D deficiency 05/17/2022   Past Surgical History:  Procedure Laterality Date   FOOT SURGERY Left    JOINT REPLACEMENT Right 2021   ORIF TIBIA PLATEAU Left 05/16/2022   Procedure: OPEN REDUCTION INTERNAL FIXATION (ORIF) TIBIAL PLATEAU;  Surgeon: Myrene GalasHandy, Michael, MD;  Location: [facility] OR;  Service: Orthopedics;  Laterality: Left;   TOTAL HIP ARTHROPLASTY Right 07/17/2020   Procedure: RIGHT TOTAL HIP ARTHROPLASTY ANTERIOR APPROACH;  Surgeon: Gean Birchwoodowan, Frank, MD;  Location: [facility];  Service: Orthopedics;  Laterality: Right;   TOTAL HIP ARTHROPLASTY Left 12/11/2020   Procedure: LEFT TOTAL HIP ARTHROPLASTY ANTERIOR APPROACH;  Surgeon: Gean Birchwoodowan, Frank, MD;  Location: [facility];  Service: Orthopedics;  Laterality: Left;   Patient Active Problem List   Diagnosis Date Noted   Vitamin D deficiency 05/17/2022   Closed fracture of lateral portion of left tibial plateau 05/16/2022   S/P hip replacement, left 12/11/2020   AVN (avascular necrosis of bone) (HCC) 12/06/2020   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  Rationale for Evaluation and Treatment Rehabilitation  PERTINENT HISTORY: ORIF L tibial plateus fx and meniscus repair with Dr Carola FrostHandy 05-16-22 Bil THR due to AVN   PRECAUTIONS:  Yes NWB for 6 weeks from surgery  from Montez MoritaKeith Paul secure message. Brace for 2 weeks and then WBAT for 2 weeks in brace before weaning.  Wean from brace after November 28th.   SUBJECTIVE:                                                                                                                                                                                       SUBJECTIVE STATEMENT:   ***   PAIN:  Are you having pain? Yes: NPRS scale: 8/10 Pain location: Lt knee Pain description: ache Aggravating factors: movement Relieving factors: medicine   OBJECTIVE: (objective measures completed at initial evaluation unless otherwise dated)  DIAGNOSTIC FINDINGS:  05-16-22 There is internal fixation of comminuted fracture in the medial tibial plateau with metallic plate and surgical screws. There is is surgical drain in the soft tissues along the lateral aspect. There is possible minimal effusion in suprapatellar bursa.   IMPRESSION: Status post internal fixation of comminuted fracture of lateral plateau of left tibia.     PATIENT  SURVEYS:  LEFS 21/80  26.3% disability   COGNITION: Overall cognitive status: Within functional limits for tasks assessed                         SENSATION: Numbness on top of ankle and down Left shin bone   EDEMA:  Circumferential: Left knee 43 cm  R 39 cm   MUSCLE LENGTH: Hamstrings: Right 74 deg; Left 60 deg     POSTURE: rounded shoulders, forward head, and tall and within normal BMI   PALPATION: TTP over medial knee down into shin and on top of ankle   LOWER EXTREMITY ROM:   Active ROM Right eval Left eval 07/10/22  Hip flexion     Left   Hip extension       Hip abduction       Hip adduction       Hip internal rotation       Hip external rotation       Knee flexion 135/P139 105/P109 104  Knee extension 0/+4 hyperext 12deficit/P 10 deficit   Ankle dorsiflexion 9 -11defict/stiff   Ankle plantarflexion       Ankle inversion       Ankle eversion        (Blank rows = not tested)   LOWER EXTREMITY MMT:   MMT Right eval Left eval  Hip flexion 5 5  Hip extension      Hip abduction 5    Hip adduction      Hip internal rotation      Hip external rotation      Knee flexion 5 3-  Knee extension 5 3-   Ankle dorsiflexion 5    Ankle plantarflexion      Ankle  inversion      Ankle eversion       (Blank rows = not tested)     FUNCTIONAL TESTS:  5 times sit to stand: 17.34 sec and Wt bears to Right   GAIT: Distance walked: 250 ft  Assistive device utilized: Crutches Level of assistance: Complete Independence Comments: Pt able to bear wt in Left LE but wt bears to right.  Pt without brace as per MD order and did not attempt stairs at eval   TODAYS TREATMENT:  [facility] Adult PT Treatment:                                                DATE: 07/15/2022 Therapeutic Exercise: *** Manual Therapy: *** Neuromuscular re-ed: *** Therapeutic Activity: *** Modalities: *** Self Care: [facility] Adult PT Treatment:                                                DATE: 07/10/22 Therapeutic Exercise: Long sit calf stretch x 60 seconds Heel slides 1 x 10 with strap; 5 seconds  SLR 2 x 8 partial range  Manual Therapy: Lt knee PROM flexion/extension to tolerance Patellar mobilizations all planes grade II-III LLE  Gait training: With single axillary crutch and SPC focusing on heel strike and increasing step length Without AD, antalgic gait present, so d/c   Modalities: Ice pack to Lt knee x 10 minutes  PATIENT EDUCATION:  Education details: continue HEP Person educated: Patient Education method: Explanation Education comprehension: verbalized understanding   HOME EXERCISE PROGRAM: Access Code: [email or web address] URL: [email or web address] Date: 07/02/2022 Prepared by: Garen Lah   Exercises - Supine active Straight Leg Raise in Brace until July 16, 2022  - 1 x daily - 7 x weekly - 3 sets - 10 reps - Long Sitting Quad Set with Towel Roll Under Heel  - 1 x daily - 7 x weekly - 3 sets - 10 reps - Supine Heel Slide  - 1 x daily - 7 x weekly - 3 sets - 10 reps - Seated Calf Stretch with Strap  - 1 x daily - 7 x weekly - 3 sets - 3 reps - 30-60-sec hold   ASSESSMENT:   CLINICAL IMPRESSION: ***   OBJECTIVE  IMPAIRMENTS: difficulty walking, decreased ROM, decreased strength, increased edema, and pain.    ACTIVITY LIMITATIONS: standing, squatting, transfers, bathing, dressing, and hygiene/grooming   PARTICIPATION LIMITATIONS: community activity, occupation, and return to exercise and recreational golf /hiking   PERSONAL FACTORS: 1 comorbidity: BIL THA for AVN  are also affecting patient's functional outcome.    REHAB POTENTIAL: Excellent   CLINICAL DECISION MAKING: Evolving/moderate complexity   EVALUATION COMPLEXITY: Moderate     GOALS: Goals reviewed with patient? Yes   SHORT TERM GOALS: Target date: 07-23-22  Pt will be independent with initial HEP Baseline: no knowledge Goal status: INITIAL   2.  Pt will return to bed to sleep rather than sleeping in recliner Baseline: eval must sleep in recliner Goal status: INITIAL   3.  Pain will decrease to 4/10 at worst/standing bending knee Baseline: eval pain up to 8/10 Goal status: INITIAL   4.  L knee AROM flexion will improve to 5-115 degrees for improved mobility to ride in car without increased pain Baseline: See AROM chart Goal status: INITIAL   5.  Pt will be able to stand with even wt distribution in bil LE Baseline: Pt wt bears to right Goal status: INITIAL   6.  Pt with DF of L will be at 0 neutral             Baseline  -11 from neutral            Goal Status: INITIAL     LONG TERM GOALS: Target date: 08/13/2022    Pt will be independent with advanced HEP Baseline: No knowledge Goal status: INITIAL   2.  Pt will be able to walk 2 miles for exercise Baseline: unable to stand for greater than 10 min Goal status: INITIAL   3.  Pt will be able to get back to recreational pursuits, hiking , golf and kayaking Baseline: unable to participate due to knee surgery Goal status: INITIAL   4.  PT with be able to walk/stand >/= 1 hour with no AD with </= 2/10 pain for functional endurance and return to leisure activities   Baseline: Pt unable to stand for greater than 10 min with 6-8/10 pain Goal status: INITIAL   5.  Pt will be able to negotiate steps without any AD and with safe execution Baseline: unable at eval due to not having brace Goal status: INITIAL   6.  Patient will report improved functional level on LEFS >/= 70/80 in order to allow for return to prior exercise level   Baseline: LEFS 21/80 26.3 % disability Goal status: INITIAL     PLAN:   PT FREQUENCY: 2x/week  PT DURATION: 6 weeks   PLANNED INTERVENTIONS: Therapeutic exercises, Therapeutic activity, Neuromuscular re-education, Balance training, Gait training, Patient/Family education, Self Care, Joint mobilization, Stair training, DME instructions, Dry Needling, Electrical stimulation, Cryotherapy, Moist heat, scar mobilization, Taping, Manual therapy, and Re-evaluation   PLAN FOR NEXT SESSION: Review HEP,  knee ROM and strengthening; gait training    Ranisha Allaire PT, DPT, LAT, ATC  07/15/22  8:05 AM      Check all possible CPT codes: 05697 - PT Re-evaluation, 97110- Therapeutic Exercise, [phone]- Neuro Re-education, [phone] - Gait Training, [phone] - Manual Therapy, [phone] - Therapeutic Activities, and [phone] - Aquatic therapy    Check all conditions that are expected to impact treatment: Musculoskeletal disorders and Complications related to surgery   If treatment provided at initial evaluation, no treatment charged due to lack of authorization.

## 2022-07-17 ENCOUNTER — Encounter: Payer: Self-pay | Admitting: Physical Therapy

## 2022-07-17 ENCOUNTER — Ambulatory Visit: Payer: Medicaid Other | Admitting: Physical Therapy

## 2022-07-17 DIAGNOSIS — R262 Difficulty in walking, not elsewhere classified: Secondary | ICD-10-CM

## 2022-07-17 DIAGNOSIS — M25562 Pain in left knee: Secondary | ICD-10-CM | POA: Diagnosis not present

## 2022-07-17 DIAGNOSIS — M6281 Muscle weakness (generalized): Secondary | ICD-10-CM

## 2022-07-17 NOTE — Therapy (Signed)
OUTPATIENT PHYSICAL THERAPY TREATMENT NOTE   Patient Name: Keith Alvarado MRN: [id number] DOB:[birth year], 46 y.o., male Today's Date: 07/17/2022  PCP: none REFERRING PROVIDER: Ainsley Spinner, PA-C    END OF SESSION:   PT End of Session - 07/17/22 1024     Visit Number 3    Number of Visits 16    Date for PT Re-Evaluation 08/27/22    Authorization Type [place] [employer] access    Authorization Time Period 11/15-11/28/23' 3 visits    Authorization - Visit Number --   no authorization   Authorization - Number of Visits --    PT Start Time 1020    PT Stop Time 1100    PT Time Calculation (min) 40 min             Past Medical History:  Diagnosis Date   Medical history non-contributory    Vitamin D deficiency 05/17/2022   Past Surgical History:  Procedure Laterality Date   FOOT SURGERY Left    JOINT REPLACEMENT Right 2021   ORIF TIBIA PLATEAU Left 05/16/2022   Procedure: OPEN REDUCTION INTERNAL FIXATION (ORIF) TIBIAL PLATEAU;  Surgeon: Altamese Rowland, MD;  Location: [place];  Service: Orthopedics;  Laterality: Left;   TOTAL HIP ARTHROPLASTY Right 07/17/2020   Procedure: RIGHT TOTAL HIP ARTHROPLASTY ANTERIOR APPROACH;  Surgeon: Frederik Pear, MD;  Location: [facility];  Service: Orthopedics;  Laterality: Right;   TOTAL HIP ARTHROPLASTY Left 12/11/2020   Procedure: LEFT TOTAL HIP ARTHROPLASTY ANTERIOR APPROACH;  Surgeon: Frederik Pear, MD;  Location: [facility];  Service: Orthopedics;  Laterality: Left;   Patient Active Problem List   Diagnosis Date Noted   Vitamin D deficiency 05/17/2022   Closed fracture of lateral portion of left tibial plateau 05/16/2022   S/P hip replacement, left 12/11/2020   AVN (avascular necrosis of bone) ([place]) 12/06/2020   AVN of femur ([place]) 07/07/2020    REFERRING DIAG: Left tibial plateau fracture     THERAPY DIAG:  Difficulty in walking, not elsewhere classified  Muscle weakness (generalized)  Acute pain of left knee  Rationale for Evaluation and  Treatment Rehabilitation  PERTINENT HISTORY: ORIF L tibial plateus fx and meniscus repair with Dr [name] 05-16-22 Bil THR due to AVN   PRECAUTIONS:  Yes NWB for 6 weeks from surgery  from Ainsley Spinner secure message. Brace for 2 weeks and then WBAT for 2 weeks in brace before weaning.  Wean from brace after November 28th.   SUBJECTIVE:  SUBJECTIVE STATEMENT:  Patient reports he is doing ok. He has been wearing his brace and completing his exercises.    PAIN:  Are you having pain? Yes: NPRS scale: 7/10 Pain location: Lt knee Pain description: ache Aggravating factors: movement Relieving factors: medicine   OBJECTIVE: (objective measures completed at initial evaluation unless otherwise dated)  DIAGNOSTIC FINDINGS:  05-16-22 There is internal fixation of comminuted fracture in the medial tibial plateau with metallic plate and surgical screws. There is is surgical drain in the soft tissues along the lateral aspect. There is possible minimal effusion in suprapatellar bursa.   IMPRESSION: Status post internal fixation of comminuted fracture of lateral plateau of left tibia.     PATIENT SURVEYS:  LEFS 21/80  26.3% disability   COGNITION: Overall cognitive status: Within functional limits for tasks assessed                         SENSATION: Numbness on top of ankle and down Left shin bone   EDEMA:  Circumferential: Left knee 43 cm  R 39 cm   MUSCLE LENGTH: Hamstrings: Right 74 deg; Left 60 deg     POSTURE: rounded shoulders, forward head, and tall and within normal BMI   PALPATION: TTP over medial knee down into shin and on top of ankle   LOWER EXTREMITY ROM:   Active ROM Right eval Left eval 07/10/22 Left 07/17/22 Left  Hip flexion        Hip extension        Hip abduction        Hip  adduction        Hip internal rotation        Hip external rotation        Knee flexion 135/P139 105/P109 104 106 active  Knee extension 0/+4 hyperext 12deficit/P 10 deficit  -5 active  Ankle dorsiflexion 9 -11defict/stiff  -20 active supine  Ankle plantarflexion        Ankle inversion        Ankle eversion         (Blank rows = not tested)   LOWER EXTREMITY MMT:   MMT Right eval Left eval  Hip flexion 5 5  Hip extension      Hip abduction 5    Hip adduction      Hip internal rotation      Hip external rotation      Knee flexion 5 3-  Knee extension 5 3-   Ankle dorsiflexion 5    Ankle plantarflexion      Ankle inversion      Ankle eversion       (Blank rows = not tested)     FUNCTIONAL TESTS:  5 times sit to stand: 17.34 sec and Wt bears to Right   GAIT: Distance walked: 250 ft  Assistive device utilized: Crutches Level of assistance: Complete Independence Comments: Pt able to bear wt in Left LE but wt bears to right.  Pt without brace as per MD order and did not attempt stairs at eval   [facility] Adult PT Treatment:                                                DATE: 07/17/22 Therapeutic Exercise: SLR 10 x 2  Heel slide 10 x 2  QS 10 x  2  Side hip abduction 10 x 2  Standing and long sitting gastroc and soleus stretching (ankle DF mobs). 30 sec x 3 each STS x 10 Mini squats at counter x 10  Manual Therapy: Ankle PROM, DF mobs, distal fib glides      [facility] Adult PT Treatment:                                                DATE: 07/10/22 Therapeutic Exercise: Long sit calf stretch x 60 seconds Heel slides 1 x 10 with strap; 5 seconds  SLR 2 x 8 partial range  Manual Therapy: Lt knee PROM flexion/extension to tolerance Patellar mobilizations all planes grade II-III LLE  Gait training: With single axillary crutch and SPC focusing on heel strike and increasing step length Without AD, antalgic gait present, so d/c   Modalities: Ice pack to Lt knee x 10  minutes        PATIENT EDUCATION:  Education details: continue HEP Person educated: Patient Education method: Explanation Education comprehension: verbalized understanding   HOME EXERCISE PROGRAM: Access Code: [email or web address] URL: [email or web address] Date: 07/02/2022 Prepared by: Voncille Lo   Exercises - Supine active Straight Leg Raise in Brace until July 16, 2022  - 1 x daily - 7 x weekly - 3 sets - 10 reps - Long Sitting Quad Set with Towel Roll Under Heel  - 1 x daily - 7 x weekly - 3 sets - 10 reps - Supine Heel Slide  - 1 x daily - 7 x weekly - 3 sets - 10 reps - Seated Calf Stretch with Strap  - 1 x daily - 7 x weekly - 3 sets - 3 reps - 30-60-sec hold Added 07/17/22 - Standing Gastroc Stretch  - 1 x daily - 7 x weekly - 1 sets - 10 reps - 5-10 hold - Mini Squat with Counter Support  - 1 x daily - 7 x weekly - 2 sets - 10 reps - Sidelying Hip Abduction  - 1 x daily - 7 x weekly - 2 sets - 10 reps   ASSESSMENT:   CLINICAL IMPRESSION: Pt reports somewhat compliance with HEP. He was out of town three days this week and had increased knee pain with plane ride. He arrives in hinge brace with SPC. Good SLR and discussed weaning from brace per MD orders. Noted decreased ankle DF A/PROM. Focused on manual for ankle ROM and reviewed stretches in open chain and added closed chain ankle mobility. He demonstrates improved knee ROM 5-106 degrees, partially meeting STG #4. He is progressing toward remaining STGs.  Reviewed gait mechanics with cues to avoid ER of LLE as tolerated. He will benefit from continued PT at 2 x per week for 6 weeks to address deficits listed and return to PLOF.       OBJECTIVE IMPAIRMENTS: difficulty walking, decreased ROM, decreased strength, increased edema, and pain.    ACTIVITY LIMITATIONS: standing, squatting, transfers, bathing, dressing, and hygiene/grooming   PARTICIPATION LIMITATIONS: community activity, occupation, and return to  exercise and recreational golf /hiking   PERSONAL FACTORS: 1 comorbidity: BIL THA for AVN  are also affecting patient's functional outcome.    REHAB POTENTIAL: Excellent   CLINICAL DECISION MAKING: Evolving/moderate complexity   EVALUATION COMPLEXITY: Moderate     GOALS: Goals reviewed with patient? Yes   SHORT TERM GOALS: Target  date: 07-23-22  Pt will be independent with initial HEP Baseline: no knowledge Status: 07/17/22 : independent Goal status: ONGOING   2.  Pt will return to bed to sleep rather than sleeping in recliner Baseline: eval must sleep in recliner Status: 07/17/22: has slept in bed 2 times.  Goal status: ONGOING   3.  Pain will decrease to 4/10 at worst/standing bending knee Baseline: eval pain up to 8/10 Status: 07/17/22: 7/10 Goal status: ONGOING   4.  L knee AROM flexion will improve to 5-115 degrees for improved mobility to ride in car without increased pain Baseline: See AROM chart Status: 07/17/22: 5-106 degrees Goal status: PARTIALLY MET   5.  Pt will be able to stand with even wt distribution in bil LE Baseline: Pt wt bears to right Status: 07/17/22: continues to bear wt to RLE, needs cues Goal status: ONGOING   6.  Pt with DF of L will be at 0 neutral             Baseline  -11 from neutral  Status: -20 from neutral             Goal Status: ONGOING     LONG TERM GOALS: Target date: 08/13/2022    Pt will be independent with advanced HEP Baseline: No knowledge Goal status: INITIAL   2.  Pt will be able to walk 2 miles for exercise Baseline: unable to stand for greater than 10 min Goal status: ONGOING   3.  Pt will be able to get back to recreational pursuits, hiking , golf and kayaking Baseline: unable to participate due to knee surgery Goal status: ONGOING   4.  PT with be able to walk/stand >/= 1 hour with no AD with </= 2/10 pain for functional endurance and return to leisure activities  Baseline: Pt unable to stand for greater  than 10 min with 6-8/10 pain Goal status: INITIAL   5.  Pt will be able to negotiate steps without any AD and with safe execution Baseline: unable at eval due to not having brace Goal status: ONGOING   6.  Patient will report improved functional level on LEFS >/= 70/80 in order to allow for return to prior exercise level   Baseline: LEFS 21/80 26.3 % disability Goal status: ONGOING     PLAN:   PT FREQUENCY: 2x/week   PT DURATION: 6 weeks   PLANNED INTERVENTIONS: Therapeutic exercises, Therapeutic activity, Neuromuscular re-education, Balance training, Gait training, Patient/Family education, Self Care, Joint mobilization, Stair training, DME instructions, Dry Needling, Electrical stimulation, Cryotherapy, Moist heat, scar mobilization, Taping, Manual therapy, and Re-evaluation   PLAN FOR NEXT SESSION: Review HEP,  knee ROM and strengthening; gait training    Hessie Diener, PTA 07/17/22 2:12 PM Phone: [phone] Fax: [phone]     Starr Lake PT, DPT, LAT, ATC  07/17/22  2:58 PM

## 2022-07-23 ENCOUNTER — Ambulatory Visit: Payer: Medicaid Other | Attending: Orthopedic Surgery | Admitting: Physical Therapy

## 2022-07-23 DIAGNOSIS — M25552 Pain in left hip: Secondary | ICD-10-CM | POA: Diagnosis present

## 2022-07-23 DIAGNOSIS — M25551 Pain in right hip: Secondary | ICD-10-CM | POA: Diagnosis present

## 2022-07-23 DIAGNOSIS — M6281 Muscle weakness (generalized): Secondary | ICD-10-CM

## 2022-07-23 DIAGNOSIS — M25652 Stiffness of left hip, not elsewhere classified: Secondary | ICD-10-CM

## 2022-07-23 DIAGNOSIS — R262 Difficulty in walking, not elsewhere classified: Secondary | ICD-10-CM | POA: Diagnosis present

## 2022-07-23 DIAGNOSIS — Z96641 Presence of right artificial hip joint: Secondary | ICD-10-CM

## 2022-07-23 DIAGNOSIS — M25562 Pain in left knee: Secondary | ICD-10-CM | POA: Diagnosis present

## 2022-07-23 DIAGNOSIS — M25651 Stiffness of right hip, not elsewhere classified: Secondary | ICD-10-CM

## 2022-07-23 DIAGNOSIS — R2689 Other abnormalities of gait and mobility: Secondary | ICD-10-CM

## 2022-07-23 NOTE — Patient Instructions (Addendum)
Sleep Tips    Keep a consistent sleep schedule. Get up at the same time every day, even on weekends or during vacations. Set a bedtime that is early enough for you to get at least 7 hours of sleep. Don't go to bed unless you are sleepy.  If you don't fall asleep after 20 minutes, get out of bed.  Establish a relaxing bedtime routine.  Use your bed only for sleep and sex.  Make your bedroom quiet and relaxing. Keep the room at a comfortable, cool temperature.  Limit exposure to bright light in the evenings. Turn off electronic devices at least 30 minutes before bedtime. Don't eat a large meal before bedtime. If you are hungry at night, eat a light, healthy snack.  Exercise regularly and maintain a healthy diet.  Avoid consuming caffeine in the late afternoon or evening.  Avoid consuming alcohol before bedtime.  Reduce your fluid intake before bedtime. Access Code: [email or web address] URL: [email or web address] Date: 07/23/2022 Prepared by: Garen Lah  Exercises - Supine active Straight Leg Raise in Brace until July 16, 2022  - 1 x daily - 7 x weekly - 3 sets - 10 reps - Long Sitting Quad Set with Towel Roll Under Heel  - 1 x daily - 7 x weekly - 3 sets - 10 reps - Supine Heel Slide  - 1 x daily - 7 x weekly - 3 sets - 10 reps - Seated Calf Stretch with Strap  - 1 x daily - 7 x weekly - 3 sets - 3 reps - 30-60-sec hold - Standing Gastroc Stretch  - 1 x daily - 7 x weekly - 1 sets - 10 reps - 5-10 hold - Mini Squat with Counter Support  - 1 x daily - 7 x weekly - 2 sets - 10 reps - Sidelying Hip Abduction  - 1 x daily - 7 x weekly - 2 sets - 10 reps - Step Up  - 1 x daily - 7 x weekly - 3 sets - 10 reps - Lateral Step Up  - 1 x daily - 7 x weekly - 3 sets - 10 reps - Standing Terminal Knee Extension with Resistance  - 1 x daily - 7 x weekly - 3 sets - 10 reps  Garen Lah, PT, ATR[facility] Certified Exercise Expert for the Aging Adult  07/23/22 10:55 AM Phone:  [phone] Fax: [phone]

## 2022-07-23 NOTE — Therapy (Signed)
OUTPATIENT PHYSICAL THERAPY TREATMENT NOTE   Patient Name: Keith Alvarado MRN: [id number] DOB:July 25, 1976, 46 y.o., male Today's Date: 07/23/2022  PCP: none REFERRING PROVIDER: Ainsley Spinner, PA-C    END OF SESSION:   PT End of Session - 07/23/22 1118     Visit Number 4    Number of Visits 16    Date for PT Re-Evaluation 08/27/22    Authorization Type [place] MCD access    Authorization Time Period 11/15-11/28/23' 3 visits    Authorization - Number of Visits 4    PT Start Time 1021    PT Stop Time 1110    PT Time Calculation (min) 49 min              Past Medical History:  Diagnosis Date   Medical history non-contributory    Vitamin D deficiency 05/17/2022   Past Surgical History:  Procedure Laterality Date   FOOT SURGERY Left    JOINT REPLACEMENT Right 2021   ORIF TIBIA PLATEAU Left 05/16/2022   Procedure: OPEN REDUCTION INTERNAL FIXATION (ORIF) TIBIAL PLATEAU;  Surgeon: Altamese Bethesda, MD;  Location: [facility];  Service: Orthopedics;  Laterality: Left;   TOTAL HIP ARTHROPLASTY Right 07/17/2020   Procedure: RIGHT TOTAL HIP ARTHROPLASTY ANTERIOR APPROACH;  Surgeon: Frederik Pear, MD;  Location: [facility];  Service: Orthopedics;  Laterality: Right;   TOTAL HIP ARTHROPLASTY Left 12/11/2020   Procedure: LEFT TOTAL HIP ARTHROPLASTY ANTERIOR APPROACH;  Surgeon: Frederik Pear, MD;  Location: [facility];  Service: Orthopedics;  Laterality: Left;   Patient Active Problem List   Diagnosis Date Noted   Vitamin D deficiency 05/17/2022   Closed fracture of lateral portion of left tibial plateau 05/16/2022   S/P hip replacement, left 12/11/2020   AVN (avascular necrosis of bone) ([place]) 12/06/2020   AVN of femur ([place]) 07/07/2020    REFERRING DIAG: Left tibial plateau fracture     THERAPY DIAG:  Difficulty in walking, not elsewhere classified  Acute pain of left knee  Pain in left hip  Stiffness of left hip, not elsewhere classified  Other abnormalities of gait and  mobility  Muscle weakness (generalized)  Stiffness of right hip, not elsewhere classified  Status post total hip replacement, right  Pain in right hip  Rationale for Evaluation and Treatment Rehabilitation  PERTINENT HISTORY: ORIF L tibial plateus fx and meniscus repair with Dr [name] 05-16-22 Bil THR due to AVN   PRECAUTIONS:  Yes NWB for 6 weeks from surgery  from Ainsley Spinner secure message. Brace for 2 weeks and then WBAT for 2 weeks in brace before weaning.  Wean from brace after November 28th.   SUBJECTIVE:  SUBJECTIVE STATEMENT:   I have been doing more walking.   Pain level 7/10,  wearing brace out in public but mostly not wearing brace.  07-23-22  7/10 PAIN:  Are you having pain? Yes: NPRS scale: 7/10 Pain location: Lt knee Pain description: ache Aggravating factors: movement Relieving factors: medicine   OBJECTIVE: (objective measures completed at initial evaluation unless otherwise dated)  DIAGNOSTIC FINDINGS:  05-16-22 There is internal fixation of comminuted fracture in the medial tibial plateau with metallic plate and surgical screws. There is is surgical drain in the soft tissues along the lateral aspect. There is possible minimal effusion in suprapatellar bursa.   IMPRESSION: Status post internal fixation of comminuted fracture of lateral plateau of left tibia.     PATIENT SURVEYS:  LEFS 21/80  26.3% disability   COGNITION: Overall cognitive status: Within functional limits for tasks assessed                         SENSATION: Numbness on top of ankle and down Left shin bone   EDEMA:  Circumferential: Left knee 43 cm  R 39 cm   MUSCLE LENGTH: Hamstrings: Right 74 deg; Left 60 deg     POSTURE: rounded shoulders, forward head, and tall and within normal BMI    PALPATION: TTP over medial knee down into shin and on top of ankle   LOWER EXTREMITY ROM:   Active ROM Right eval Left eval 07/10/22 Left 07/17/22 Left 07-23-22 Left  Hip flexion         Hip extension         Hip abduction         Hip adduction         Hip internal rotation         Hip external rotation         Knee flexion 135/P139 105/P109 104 106 active 110 Active  Knee extension 0/+4 hyperext 12deficit/P 10 deficit  -5 active -3 active  Ankle dorsiflexion 9 -11defict/stiff  -20 active supine -17 active supine  Ankle plantarflexion         Ankle inversion         Ankle eversion          (Blank rows = not tested)   LOWER EXTREMITY MMT:   MMT Right eval Left eval  Hip flexion 5 5  Hip extension      Hip abduction 5    Hip adduction      Hip internal rotation      Hip external rotation      Knee flexion 5 3-  Knee extension 5 3-   Ankle dorsiflexion 5    Ankle plantarflexion      Ankle inversion      Ankle eversion       (Blank rows = not tested)     FUNCTIONAL TESTS:  5 times sit to stand: 17.34 sec and Wt bears to Right   GAIT: Distance walked: 250 ft  Assistive device utilized: Crutches Level of assistance: Complete Independence Comments: Pt able to bear wt in Left LE but wt bears to right.  Pt without brace as per MD order and did not attempt stairs at eval  [facility] Adult PT Treatment:  DATE: 07-23-22 Therapeutic Exercise:  TKE with GTB DF stretch with green stretch out strap Slant board L LE 1 min x 2 on LT.  Deep squat at sink 10 x to pt tolerance Step ups 3 x 10 forward 6 inch step Lateral step ups 3 x 10 6 inch step Manual Therapy: Ankle PROM, DF mobs, distal fib glides   Gait    wt shift side to side  Wt shift with LT foot forward Wt shift with RT foot forward  Self Care: How to wear  brace correctly with return demo for uneven surfaces and public walking only Sleep hygiene   [facility] Adult PT  Treatment:                                                DATE: 07/17/22 Therapeutic Exercise: SLR 10 x 2  Heel slide 10 x 2  QS 10 x 2  Side hip abduction 10 x 2  Standing and long sitting gastroc and soleus stretching (ankle DF mobs). 30 sec x 3 each STS x 10 Mini squats at counter x 10  Manual Therapy: Ankle PROM, DF mobs, distal fib glides      [facility] Adult PT Treatment:                                                DATE: 07/10/22 Therapeutic Exercise: Long sit calf stretch x 60 seconds Heel slides 1 x 10 with strap; 5 seconds  SLR 2 x 8 partial range  Manual Therapy: Lt knee PROM flexion/extension to tolerance Patellar mobilizations all planes grade II-III LLE  Gait training: With single axillary crutch and SPC focusing on heel strike and increasing step length Without AD, antalgic gait present, so d/c   Modalities: Ice pack to Lt knee x 10 minutes        PATIENT EDUCATION:  Education details: continue HEP Person educated: Patient Education method: Explanation Education comprehension: verbalized understanding   HOME EXERCISE PROGRAM: Access Code: [email or web address] URL: [email or web address] Date: 07/23/2022 Prepared by: Voncille Lo  Exercises - Supine active Straight Leg Raise in Brace until July 16, 2022  - 1 x daily - 7 x weekly - 3 sets - 10 reps - Long Sitting Quad Set with Towel Roll Under Heel  - 1 x daily - 7 x weekly - 3 sets - 10 reps - Supine Heel Slide  - 1 x daily - 7 x weekly - 3 sets - 10 reps - Seated Calf Stretch with Strap  - 1 x daily - 7 x weekly - 3 sets - 3 reps - 30-60-sec hold - Standing Gastroc Stretch  - 1 x daily - 7 x weekly - 1 sets - 10 reps - 5-10 hold - Mini Squat with Counter Support  - 1 x daily - 7 x weekly - 2 sets - 10 reps - Sidelying Hip Abduction  - 1 x daily - 7 x weekly - 2 sets - 10 reps - Step Up  - 1 x daily - 7 x weekly - 3 sets - 10 reps - Lateral Step Up  - 1 x daily - 7 x weekly - 3 sets - 10  reps - Standing Terminal Knee Extension with  Resistance  - 1 x daily - 7 x weekly - 3 sets - 10 reps   ASSESSMENT:   CLINICAL IMPRESSION: Pt reports compliance with HEP. He arrives in hinge brace without AD.  Pt with antalgic gait with RT trunk lean with cues to avoid ER of LLE as tolerated  Pt with hinge brace below knee joint.  Pt is weaning from brace and only wears in public and uneven ground.  Pt given sleep hygiene since he mentioned not sleeping well even before accident to improve healing.  Significant decreased ankle DF A/PROM. 17 degrees from neutral, more fore foot movmeent with more locked true DF.  Pt knee AROM ext/flex  3/110.  Focused on manual for ankle ROM . Updated HEP today for Closed kinetic chain step ups, TKE and  squats at counter to pt tolerance. Emphasis on gait training to improve wt bearing  STG # 1 met compliant with initial HEP. He is progressing toward remaining STGs.  He will benefit from continued PT at 2 x per week for 6 weeks to address deficits listed and return to PLOF.       OBJECTIVE IMPAIRMENTS: difficulty walking, decreased ROM, decreased strength, increased edema, and pain.    ACTIVITY LIMITATIONS: standing, squatting, transfers, bathing, dressing, and hygiene/grooming   PARTICIPATION LIMITATIONS: community activity, occupation, and return to exercise and recreational golf /hiking   PERSONAL FACTORS: 1 comorbidity: BIL THA for AVN  are also affecting patient's functional outcome.    REHAB POTENTIAL: Excellent   CLINICAL DECISION MAKING: Evolving/moderate complexity   EVALUATION COMPLEXITY: Moderate     GOALS: Goals reviewed with patient? Yes   SHORT TERM GOALS: Target date: 07-23-22  Pt will be independent with initial HEP Baseline: no knowledge Status: 07/17/22 : independent Goal status: MET   2.  Pt will return to bed to sleep rather than sleeping in recliner Baseline: eval must sleep in recliner Status: 07/17/22: has slept in bed 2 times.   Goal status: ONGOING   3.  Pain will decrease to 4/10 at worst/standing bending knee Baseline: eval pain up to 8/10 Status: 07/17/22: 7/10  07-23-22  7/10 Goal status: ONGOING   4.  L knee AROM flexion will improve to 5-115 degrees for improved mobility to ride in car without increased pain Baseline: See AROM chart Status: 07/17/22: 5-106 degrees Status 07-23-22  3-110 degrees Goal status: PARTIALLY MET   5.  Pt will be able to stand with even wt distribution in bil LE Baseline: Pt wt bears to right Status: 07/17/22: continues to bear wt to RLE, needs cues 07-23-22  worked on wt bearing activities. Goal status: ONGOING   6.  Pt with DF of L will be at 0 neutral             Baseline  -11 from neutral  Status: -20 from neutral   Status -17 from neutral            Goal Status: ONGOING     LONG TERM GOALS: Target date: 08/13/2022    Pt will be independent with advanced HEP Baseline: No knowledge Goal status: INITIAL   2.  Pt will be able to walk 2 miles for exercise Baseline: unable to stand for greater than 10 min Goal status: ONGOING   3.  Pt will be able to get back to recreational pursuits, hiking , golf and kayaking Baseline: unable to participate due to knee surgery Goal status: ONGOING   4.  PT with be able to  walk/stand >/= 1 hour with no AD with </= 2/10 pain for functional endurance and return to leisure activities  Baseline: Pt unable to stand for greater than 10 min with 6-8/10 pain Goal status: INITIAL   5.  Pt will be able to negotiate steps without any AD and with safe execution Baseline: unable at eval due to not having brace Goal status: ONGOING   6.  Patient will report improved functional level on LEFS >/= 70/80 in order to allow for return to prior exercise level   Baseline: LEFS 21/80 26.3 % disability Goal status: ONGOING     PLAN:   PT FREQUENCY: 2x/week   PT DURATION: 6 weeks   PLANNED INTERVENTIONS: Therapeutic exercises, Therapeutic  activity, Neuromuscular re-education, Balance training, Gait training, Patient/Family education, Self Care, Joint mobilization, Stair training, DME instructions, Dry Needling, Electrical stimulation, Cryotherapy, Moist heat, scar mobilization, Taping, Manual therapy, and Re-evaluation   PLAN FOR NEXT SESSION: Review HEP,  knee ROM and strengthening; gait training    Voncille Lo, PT, [place] Certified Exercise Expert for the Aging Adult  07/23/22 11:22 AM Phone: [phone] Fax: [phone]

## 2022-07-25 ENCOUNTER — Ambulatory Visit: Payer: Medicaid Other | Admitting: Physical Therapy

## 2022-07-25 ENCOUNTER — Encounter: Payer: Self-pay | Admitting: Physical Therapy

## 2022-07-25 DIAGNOSIS — R262 Difficulty in walking, not elsewhere classified: Secondary | ICD-10-CM | POA: Diagnosis not present

## 2022-07-25 DIAGNOSIS — M25651 Stiffness of right hip, not elsewhere classified: Secondary | ICD-10-CM

## 2022-07-25 DIAGNOSIS — M25551 Pain in right hip: Secondary | ICD-10-CM

## 2022-07-25 DIAGNOSIS — M6281 Muscle weakness (generalized): Secondary | ICD-10-CM

## 2022-07-25 DIAGNOSIS — M25562 Pain in left knee: Secondary | ICD-10-CM

## 2022-07-25 DIAGNOSIS — R2689 Other abnormalities of gait and mobility: Secondary | ICD-10-CM

## 2022-07-25 DIAGNOSIS — M25552 Pain in left hip: Secondary | ICD-10-CM

## 2022-07-25 DIAGNOSIS — M25652 Stiffness of left hip, not elsewhere classified: Secondary | ICD-10-CM

## 2022-07-25 NOTE — Therapy (Signed)
OUTPATIENT PHYSICAL THERAPY TREATMENT NOTE   Patient Name: Keith Alvarado MRN: [id number] DOB:[birth year], 46 y.o., male Today's Date: 07/25/2022  PCP: none REFERRING PROVIDER: Ainsley Spinner, PA-C    END OF SESSION:   PT End of Session - 07/25/22 1157     Visit Number 5    Number of Visits 16    Date for PT Re-Evaluation 08/27/22    Authorization Type [place] MCD access    Authorization Time Period 11/15-11/28/23' 3 visits    PT Start Time 1155    PT Stop Time 1233    PT Time Calculation (min) 38 min    Activity Tolerance Patient tolerated treatment well    Behavior During Therapy [facility] for tasks assessed/performed              Past Medical History:  Diagnosis Date   Medical history non-contributory    Vitamin D deficiency 05/17/2022   Past Surgical History:  Procedure Laterality Date   FOOT SURGERY Left    JOINT REPLACEMENT Right 2021   ORIF TIBIA PLATEAU Left 05/16/2022   Procedure: OPEN REDUCTION INTERNAL FIXATION (ORIF) TIBIAL PLATEAU;  Surgeon: Altamese [place], MD;  Location: [facility];  Service: Orthopedics;  Laterality: Left;   TOTAL HIP ARTHROPLASTY Right 07/17/2020   Procedure: RIGHT TOTAL HIP ARTHROPLASTY ANTERIOR APPROACH;  Surgeon: Frederik Pear, MD;  [facility];  Service: Orthopedics;  Laterality: Right;   TOTAL HIP ARTHROPLASTY Left 12/11/2020   Procedure: LEFT TOTAL HIP ARTHROPLASTY ANTERIOR APPROACH;  Surgeon: Frederik Pear, MD;  [facility];  Service: Orthopedics;  Laterality: Left;   Patient Active Problem List   Diagnosis Date Noted   Vitamin D deficiency 05/17/2022   Closed fracture of lateral portion of left tibial plateau 05/16/2022   S/P hip replacement, left 12/11/2020   AVN (avascular necrosis of bone) ([place]) 12/06/2020   AVN of femur ([place]) 07/07/2020    REFERRING DIAG: Left tibial plateau fracture     THERAPY DIAG:  Difficulty in walking, not elsewhere classified  Acute pain of left knee  Pain in left hip  Stiffness of left  hip, not elsewhere classified  Other abnormalities of gait and mobility  Muscle weakness (generalized)  Stiffness of right hip, not elsewhere classified  Pain in right hip  Rationale for Evaluation and Treatment Rehabilitation  PERTINENT HISTORY: ORIF L tibial plateus fx and meniscus repair with Dr [name] 05-16-22 Bil THR due to AVN   PRECAUTIONS:  Yes NWB for 6 weeks from surgery  from Ainsley Spinner secure message. Brace for 2 weeks and then WBAT for 2 weeks in brace before weaning.  Wean from brace after November 28th.   SUBJECTIVE:  SUBJECTIVE STATEMENT:   I have been doing more walking.   Pain level 6/10 today. I went to the doctor and he took ankle xrays.  Nothing broken/fractured.  Just stiff.    07-25-22  6/10 07-23-22  7/10 PAIN:  Are you having pain? Yes: NPRS scale: 7/10 Pain location: Lt knee Pain description: ache Aggravating factors: movement Relieving factors: medicine   OBJECTIVE: (objective measures completed at initial evaluation unless otherwise dated)  DIAGNOSTIC FINDINGS:  05-16-22 There is internal fixation of comminuted fracture in the medial tibial plateau with metallic plate and surgical screws. There is is surgical drain in the soft tissues along the lateral aspect. There is possible minimal effusion in suprapatellar bursa.   IMPRESSION: Status post internal fixation of comminuted fracture of lateral plateau of left tibia.     PATIENT SURVEYS:  LEFS 21/80  26.3% disability   COGNITION: Overall cognitive status: Within functional limits for tasks assessed                         SENSATION: Numbness on top of ankle and down Left shin bone   EDEMA:  Circumferential: Left knee 43 cm  R 39 cm   MUSCLE LENGTH: Hamstrings: Right 74 deg; Left 60 deg     POSTURE: rounded  shoulders, forward head, and tall and within normal BMI   PALPATION: TTP over medial knee down into shin and on top of ankle   LOWER EXTREMITY ROM:   Active ROM Right eval Left eval 07/10/22 Left 07/17/22 Left 07-23-22 Left  Hip flexion         Hip extension         Hip abduction         Hip adduction         Hip internal rotation         Hip external rotation         Knee flexion 135/P139 105/P109 104 106 active 110 Active  Knee extension 0/+4 hyperext 12deficit/P 10 deficit  -5 active -3 active  Ankle dorsiflexion 9 -11defict/stiff  -20 active supine -17 active supine  Ankle plantarflexion         Ankle inversion         Ankle eversion          (Blank rows = not tested)   LOWER EXTREMITY MMT:   MMT Right eval Left eval  Hip flexion 5 5  Hip extension      Hip abduction 5    Hip adduction      Hip internal rotation      Hip external rotation      Knee flexion 5 3-  Knee extension 5 3-   Ankle dorsiflexion 5    Ankle plantarflexion      Ankle inversion      Ankle eversion       (Blank rows = not tested)     FUNCTIONAL TESTS:  5 times sit to stand: 17.34 sec and Wt bears to Right   GAIT: Distance walked: 250 ft  Assistive device utilized: Crutches Level of assistance: Complete Independence Comments: Pt able to bear wt in Left LE but wt bears to right.  Pt without brace as per MD order and did not attempt stairs at eval [facility] Adult PT Treatment:  DATE: 07-25-22 Therapeutic Exercise:  Recumbent bike 5 min  level 3 resistance Slant board L LE 1 min x 3 on LT.  Step ups 3 x 15 forward 6 inch step Lateral step ups 3 x 15 6 inch step TKE with BTB  3 x 15 Deep lunge on 6 inch step with RT foot on 6 inch step  to increase hip flex/ ankle DF 2 x 15 Deep squat at sink 15 x to pt tolerance  Manual Therapy: Ankle PROM, DF mobs, distal fib glides   Prone knee/quad stretch Lateral ankle mobs in prone on LT LE PA grade  iii/III [place] mobs Gait    Gait training on steps up and down with alternating step.  , gait with ER of LT foot with VC for correction  [facility] Adult PT Treatment:                                                DATE: 07-23-22 Therapeutic Exercise:  TKE with GTB DF stretch with green stretch out strap Slant board L LE 1 min x 2 on LT.  Deep squat at sink 10 x to pt tolerance Step ups 3 x 10 forward 6 inch step Lateral step ups 3 x 10 6 inch step Manual Therapy: Ankle PROM, DF mobs, distal fib glides   Gait    wt shift side to side  Wt shift with LT foot forward Wt shift with RT foot forward  Self Care: How to wear  brace correctly with return demo for uneven surfaces and public walking only Sleep hygiene   [facility] Adult PT Treatment:                                                DATE: 07/17/22 Therapeutic Exercise: SLR 10 x 2  Heel slide 10 x 2  QS 10 x 2  Side hip abduction 10 x 2  Standing and long sitting gastroc and soleus stretching (ankle DF mobs). 30 sec x 3 each STS x 10 Mini squats at counter x 10  Manual Therapy: Ankle PROM, DF mobs, distal fib glides      [facility] Adult PT Treatment:                                                DATE: 07/10/22 Therapeutic Exercise: Long sit calf stretch x 60 seconds Heel slides 1 x 10 with strap; 5 seconds  SLR 2 x 8 partial range  Manual Therapy: Lt knee PROM flexion/extension to tolerance Patellar mobilizations all planes grade II-III LLE  Gait training: With single axillary crutch and SPC focusing on heel strike and increasing step length Without AD, antalgic gait present, so d/c   Modalities: Ice pack to Lt knee x 10 minutes        PATIENT EDUCATION:  Education details: continue HEP Person educated: Patient Education method: Explanation Education comprehension: verbalized understanding   HOME EXERCISE PROGRAM: Access Code: [email or web address] URL: [email or web address] Date: 07/23/2022 Prepared by: Voncille Lo  Exercises - Supine active Straight Leg Raise in Brace until July 16, 2022  -  1 x daily - 7 x weekly - 3 sets - 10 reps - Long Sitting Quad Set with Towel Roll Under Heel  - 1 x daily - 7 x weekly - 3 sets - 10 reps - Supine Heel Slide  - 1 x daily - 7 x weekly - 3 sets - 10 reps - Seated Calf Stretch with Strap  - 1 x daily - 7 x weekly - 3 sets - 3 reps - 30-60-sec hold - Standing Gastroc Stretch  - 1 x daily - 7 x weekly - 1 sets - 10 reps - 5-10 hold - Mini Squat with Counter Support  - 1 x daily - 7 x weekly - 2 sets - 10 reps - Sidelying Hip Abduction  - 1 x daily - 7 x weekly - 2 sets - 10 reps - Step Up  - 1 x daily - 7 x weekly - 3 sets - 10 reps - Lateral Step Up  - 1 x daily - 7 x weekly - 3 sets - 10 reps - Standing Terminal Knee Extension with Resistance  - 1 x daily - 7 x weekly - 3 sets - 10 reps   ASSESSMENT:   CLINICAL IMPRESSION:   Pt is ambulating with decrease antalgic gait but with ER of foot due to decreased DF.  Emphasis today on CKC exercises an manual for decreasing Left ankle stiffness.  Pain at 5-6/10 today.  Pt compliant with HEP.  PT with MD appt and had xrays taken of LT ankle with no evidence of fx.  Will continue to progress to return to PLOF       OBJECTIVE IMPAIRMENTS: difficulty walking, decreased ROM, decreased strength, increased edema, and pain.    ACTIVITY LIMITATIONS: standing, squatting, transfers, bathing, dressing, and hygiene/grooming   PARTICIPATION LIMITATIONS: community activity, occupation, and return to exercise and recreational golf /hiking   PERSONAL FACTORS: 1 comorbidity: BIL THA for AVN  are also affecting patient's functional outcome.    REHAB POTENTIAL: Excellent   CLINICAL DECISION MAKING: Evolving/moderate complexity   EVALUATION COMPLEXITY: Moderate     GOALS: Goals reviewed with patient? Yes   SHORT TERM GOALS: Target date: 07-23-22  Pt will be independent with initial HEP Baseline: no  knowledge Status: 07/17/22 : independent Goal status: MET   2.  Pt will return to bed to sleep rather than sleeping in recliner Baseline: eval must sleep in recliner Status: 07/17/22: has slept in bed 2 times.  Goal status: ONGOING   3.  Pain will decrease to 4/10 at worst/standing bending knee Baseline: eval pain up to 8/10 Status: 07/17/22: 7/10  07-23-22  7/10 Goal status: ONGOING   4.  L knee AROM flexion will improve to 5-115 degrees for improved mobility to ride in car without increased pain Baseline: See AROM chart Status: 07/17/22: 5-106 degrees Status 07-23-22  3-110 degrees Goal status: PARTIALLY MET   5.  Pt will be able to stand with even wt distribution in bil LE Baseline: Pt wt bears to right Status: 07/17/22: continues to bear wt to RLE, needs cues 07-23-22  worked on wt bearing activities. Goal status: ONGOING   6.  Pt with DF of L will be at 0 neutral             Baseline  -11 from neutral  Status: -20 from neutral   Status -17 from neutral            Goal Status: ONGOING     LONG  TERM GOALS: Target date: 08/13/2022    Pt will be independent with advanced HEP Baseline: No knowledge Goal status: INITIAL   2.  Pt will be able to walk 2 miles for exercise Baseline: unable to stand for greater than 10 min Goal status: ONGOING   3.  Pt will be able to get back to recreational pursuits, hiking , golf and kayaking Baseline: unable to participate due to knee surgery Goal status: ONGOING   4.  PT with be able to walk/stand >/= 1 hour with no AD with </= 2/10 pain for functional endurance and return to leisure activities  Baseline: Pt unable to stand for greater than 10 min with 6-8/10 pain Goal status: INITIAL   5.  Pt will be able to negotiate steps without any AD and with safe execution Baseline: unable at eval due to not having brace Goal status: ONGOING   6.  Patient will report improved functional level on LEFS >/= 70/80 in order to allow for return to  prior exercise level   Baseline: LEFS 21/80 26.3 % disability Goal status: ONGOING     PLAN:   PT FREQUENCY: 2x/week   PT DURATION: 6 weeks   PLANNED INTERVENTIONS: Therapeutic exercises, Therapeutic activity, Neuromuscular re-education, Balance training, Gait training, Patient/Family education, Self Care, Joint mobilization, Stair training, DME instructions, Dry Needling, Electrical stimulation, Cryotherapy, Moist heat, scar mobilization, Taping, Manual therapy, and Re-evaluation   PLAN FOR NEXT SESSION: Review HEP,  knee ROM and strengthening; gait training    Voncille Lo, PT, [place] Certified Exercise Expert for the Aging Adult  07/25/22 12:45 PM Phone: [phone] Fax: [phone]

## 2022-07-29 ENCOUNTER — Ambulatory Visit: Payer: Medicaid Other | Admitting: Physical Therapy

## 2022-07-29 ENCOUNTER — Telehealth: Payer: Self-pay | Admitting: Physical Therapy

## 2022-07-29 NOTE — Telephone Encounter (Signed)
Attempted to call number provided it went to VM. Unable to leave VM due to VM being full.   Vanassa Penniman PT, DPT, LAT, ATC  07/29/22  4:08 PM

## 2022-07-29 NOTE — Therapy (Incomplete)
OUTPATIENT PHYSICAL THERAPY TREATMENT NOTE   Patient Name: Keith Alvarado MRN: [id number] DOB:October 31, 1975, 46 y.o., male Today's Date: 07/29/2022  PCP: none REFERRING PROVIDER: Ainsley Spinner, PA-C    END OF SESSION:      Past Medical History:  Diagnosis Date   Medical history non-contributory    Vitamin D deficiency 05/17/2022   Past Surgical History:  Procedure Laterality Date   FOOT SURGERY Left    JOINT REPLACEMENT Right 2021   ORIF TIBIA PLATEAU Left 05/16/2022   Procedure: OPEN REDUCTION INTERNAL FIXATION (ORIF) TIBIAL PLATEAU;  Surgeon: Keith [place], MD;  Location: [place];  Service: Orthopedics;  Laterality: Left;   TOTAL HIP ARTHROPLASTY Right 07/17/2020   Procedure: RIGHT TOTAL HIP ARTHROPLASTY ANTERIOR APPROACH;  Surgeon: Keith Pear, MD;  Location: [facility];  Service: Orthopedics;  Laterality: Right;   TOTAL HIP ARTHROPLASTY Left 12/11/2020   Procedure: LEFT TOTAL HIP ARTHROPLASTY ANTERIOR APPROACH;  Surgeon: Keith Pear, MD;  Location: [facility];  Service: Orthopedics;  Laterality: Left;   Patient Active Problem List   Diagnosis Date Noted   Vitamin D deficiency 05/17/2022   Closed fracture of lateral portion of left tibial plateau 05/16/2022   S/P hip replacement, left 12/11/2020   AVN (avascular necrosis of bone) ([place]) 12/06/2020   AVN of femur ([place]) 07/07/2020    REFERRING DIAG: Left tibial plateau fracture     THERAPY DIAG:  No diagnosis found.  Rationale for Evaluation and Treatment Rehabilitation  PERTINENT HISTORY: ORIF L tibial plateus fx and meniscus repair with Dr Keith Alvarado 05-16-22 Bil THR due to AVN   PRECAUTIONS:  Yes NWB for 6 weeks from surgery  from Keith Alvarado secure message. Brace for 2 weeks and then WBAT for 2 weeks in brace before weaning.  Wean from brace after November 28th.   SUBJECTIVE:                                                                                                                                                                                       SUBJECTIVE STATEMENT:    PAIN:  Are you having pain? Yes: NPRS scale: 7/10 Pain location: Lt knee Pain description: ache Aggravating factors: movement Relieving factors: medicine   OBJECTIVE: (objective measures completed at initial evaluation unless otherwise dated)  DIAGNOSTIC FINDINGS:  05-16-22 There is internal fixation of comminuted fracture in the medial tibial plateau with metallic plate and surgical screws. There is is surgical drain in the soft tissues along the lateral aspect. There is possible minimal effusion in suprapatellar bursa.   IMPRESSION: Status post internal fixation of comminuted fracture of lateral plateau of left tibia.     PATIENT SURVEYS:  LEFS 21/80  26.3% disability   COGNITION: Overall cognitive status: Within functional limits for tasks assessed                         SENSATION: Numbness on top of ankle and down Left shin bone   EDEMA:  Circumferential: Left knee 43 cm  R 39 cm   MUSCLE LENGTH: Hamstrings: Right 74 deg; Left 60 deg     POSTURE: rounded shoulders, forward head, and tall and within normal BMI   PALPATION: TTP over medial knee down into shin and on top of ankle   LOWER EXTREMITY ROM:   Active ROM Right eval Left eval 07/10/22 Left 07/17/22 Left 07-23-22 Left  Hip flexion         Hip extension         Hip abduction         Hip adduction         Hip internal rotation         Hip external rotation         Knee flexion 135/P139 105/P109 104 106 active 110 Active  Knee extension 0/+4 hyperext 12deficit/P 10 deficit  -5 active -3 active  Ankle dorsiflexion 9 -11defict/stiff  -20 active supine -17 active supine  Ankle plantarflexion         Ankle inversion         Ankle eversion          (Blank rows = not tested)   LOWER EXTREMITY MMT:   MMT Right eval Left eval  Hip flexion 5 5  Hip extension      Hip abduction 5    Hip adduction      Hip internal rotation      Hip  external rotation      Knee flexion 5 3-  Knee extension 5 3-   Ankle dorsiflexion 5    Ankle plantarflexion      Ankle inversion      Ankle eversion       (Blank rows = not tested)     FUNCTIONAL TESTS:  5 times sit to stand: 17.34 sec and Wt bears to Right   GAIT: Distance walked: 250 ft  Assistive device utilized: Crutches Level of assistance: Complete Independence Comments: Pt able to bear wt in Left LE but wt bears to right.  Pt without brace as per MD order and did not attempt stairs at eval  TREAT MENT______________________________________________________________________  [facility] Adult PT Treatment:                                                DATE: 07/29/22 Therapeutic Exercise: *** Manual Therapy: *** Neuromuscular re-ed: *** Therapeutic Activity: *** Modalities: *** Self Care: ***  Keith Alvarado Adult PT Treatment:                                                DATE: 07-25-22 Therapeutic Exercise: Recumbent bike 5 min  level 3 resistance Slant board L LE 1 min x 3 on LT.  Step ups 3 x 15 forward 6 inch step Lateral step ups 3 x 15 6 inch step TKE with BTB  3 x 15 Deep lunge on  6 inch step with RT foot on 6 inch step  to increase hip flex/ ankle DF 2 x 15 Deep squat at sink 15 x to pt tolerance  Manual Therapy: Ankle PROM, DF mobs, distal fib glides   Prone knee/quad stretch Lateral ankle mobs in prone on LT LE PA grade iii/III Talar mobs Gait    Gait training on steps up and down with alternating step.  , gait with ER of LT foot with VC for correction    [facility] Adult PT Treatment:                                                DATE: 07-23-22 Therapeutic Exercise:  TKE with GTB DF stretch with green stretch out strap Slant board L LE 1 min x 2 on LT.  Deep squat at sink 10 x to pt tolerance Step ups 3 x 10 forward 6 inch step Lateral step ups 3 x 10 6 inch step Manual Therapy: Ankle PROM, DF mobs, distal fib glides   Gait    wt shift side to side  Wt  shift with LT foot forward Wt shift with RT foot forward  Self Care: How to wear  brace correctly with return demo for uneven surfaces and public walking only Sleep hygiene      PATIENT EDUCATION:  Education details: continue HEP Person educated: Patient Education method: Explanation Education comprehension: verbalized understanding   HOME EXERCISE PROGRAM: Access Code: [email or web address] URL: [email or web address] Date: 07/23/2022 Prepared by: Voncille Lo  Exercises - Supine active Straight Leg Raise in Brace until July 16, 2022  - 1 x daily - 7 x weekly - 3 sets - 10 reps - Long Sitting Quad Set with Towel Roll Under Heel  - 1 x daily - 7 x weekly - 3 sets - 10 reps - Supine Heel Slide  - 1 x daily - 7 x weekly - 3 sets - 10 reps - Seated Calf Stretch with Strap  - 1 x daily - 7 x weekly - 3 sets - 3 reps - 30-60-sec hold - Standing Gastroc Stretch  - 1 x daily - 7 x weekly - 1 sets - 10 reps - 5-10 hold - Mini Squat with Counter Support  - 1 x daily - 7 x weekly - 2 sets - 10 reps - Sidelying Hip Abduction  - 1 x daily - 7 x weekly - 2 sets - 10 reps - Step Up  - 1 x daily - 7 x weekly - 3 sets - 10 reps - Lateral Step Up  - 1 x daily - 7 x weekly - 3 sets - 10 reps - Standing Terminal Knee Extension with Resistance  - 1 x daily - 7 x weekly - 3 sets - 10 reps   ASSESSMENT:   CLINICAL IMPRESSION: ***    OBJECTIVE IMPAIRMENTS: difficulty walking, decreased ROM, decreased strength, increased edema, and pain.    ACTIVITY LIMITATIONS: standing, squatting, transfers, bathing, dressing, and hygiene/grooming   PARTICIPATION LIMITATIONS: community activity, occupation, and return to exercise and recreational golf /hiking   PERSONAL FACTORS: 1 comorbidity: BIL THA for AVN  are also affecting patient's functional outcome.    REHAB POTENTIAL: Excellent   CLINICAL DECISION MAKING: Evolving/moderate complexity   EVALUATION COMPLEXITY: Moderate     GOALS: Goals  reviewed with patient? Yes   SHORT  TERM GOALS: Target date: 07-23-22  Pt will be independent with initial HEP Baseline: no knowledge Status: 07/17/22 : independent Goal status: MET   2.  Pt will return to bed to sleep rather than sleeping in recliner Baseline: eval must sleep in recliner Status: 07/17/22: has slept in bed 2 times.  Goal status: ONGOING   3.  Pain will decrease to 4/10 at worst/standing bending knee Baseline: eval pain up to 8/10 Status: 07/17/22: 7/10  07-23-22  7/10 Goal status: ONGOING   4.  L knee AROM flexion will improve to 5-115 degrees for improved mobility to ride in car without increased pain Baseline: See AROM chart Status: 07/17/22: 5-106 degrees Status 07-23-22  3-110 degrees Goal status: PARTIALLY MET   5.  Pt will be able to stand with even wt distribution in bil LE Baseline: Pt wt bears to right Status: 07/17/22: continues to bear wt to RLE, needs cues 07-23-22  worked on wt bearing activities. Goal status: ONGOING   6.  Pt with DF of L will be at 0 neutral             Baseline  -11 from neutral  Status: -20 from neutral   Status -17 from neutral            Goal Status: ONGOING     LONG TERM GOALS: Target date: 08/13/2022    Pt will be independent with advanced HEP Baseline: No knowledge Goal status: INITIAL   2.  Pt will be able to walk 2 miles for exercise Baseline: unable to stand for greater than 10 min Goal status: ONGOING   3.  Pt will be able to get back to recreational pursuits, hiking , golf and kayaking Baseline: unable to participate due to knee surgery Goal status: ONGOING   4.  PT with be able to walk/stand >/= 1 hour with no AD with </= 2/10 pain for functional endurance and return to leisure activities  Baseline: Pt unable to stand for greater than 10 min with 6-8/10 pain Goal status: INITIAL   5.  Pt will be able to negotiate steps without any AD and with safe execution Baseline: unable at eval due to not having  brace Goal status: ONGOING   6.  Patient will report improved functional level on LEFS >/= 70/80 in order to allow for return to prior exercise level   Baseline: LEFS 21/80 26.3 % disability Goal status: ONGOING     PLAN:   PT FREQUENCY: 2x/week   PT DURATION: 6 weeks   PLANNED INTERVENTIONS: Therapeutic exercises, Therapeutic activity, Neuromuscular re-education, Balance training, Gait training, Patient/Family education, Self Care, Joint mobilization, Stair training, DME instructions, Dry Needling, Electrical stimulation, Cryotherapy, Moist heat, scar mobilization, Taping, Manual therapy, and Re-evaluation   PLAN FOR NEXT SESSION: Review HEP,  knee ROM and strengthening; gait training    Harlym Gehling PT, DPT, LAT, ATC  07/29/22  8:45 AM

## 2022-07-31 ENCOUNTER — Ambulatory Visit: Payer: Medicaid Other | Admitting: Physical Therapy

## 2022-07-31 ENCOUNTER — Encounter: Payer: Self-pay | Admitting: Physical Therapy

## 2022-07-31 DIAGNOSIS — M25552 Pain in left hip: Secondary | ICD-10-CM

## 2022-07-31 DIAGNOSIS — R262 Difficulty in walking, not elsewhere classified: Secondary | ICD-10-CM

## 2022-07-31 DIAGNOSIS — M25562 Pain in left knee: Secondary | ICD-10-CM

## 2022-07-31 NOTE — Therapy (Signed)
OUTPATIENT PHYSICAL THERAPY TREATMENT NOTE   Patient Name: Keith Alvarado MRN: [id number] DOB:05-22-1976, 46 y.o., male Today's Date: 07/31/2022  PCP: none REFERRING PROVIDER: Ainsley Spinner, PA-C    END OF SESSION:   PT End of Session - 07/31/22 1020     Visit Number 6    Number of Visits 16    Date for PT Re-Evaluation 08/27/22    Authorization Type [place] [employer] access    Authorization Time Period 07/23/11/31/2023    Authorization - Visit Number 1    Authorization - Number of Visits 8    PT Start Time 7628               Past Medical History:  Diagnosis Date   Medical history non-contributory    Vitamin D deficiency 05/17/2022   Past Surgical History:  Procedure Laterality Date   FOOT SURGERY Left    JOINT REPLACEMENT Right 2021   ORIF TIBIA PLATEAU Left 05/16/2022   Procedure: OPEN REDUCTION INTERNAL FIXATION (ORIF) TIBIAL PLATEAU;  Surgeon: Altamese [place], MD;  Location: [place];  Service: Orthopedics;  Laterality: Left;   TOTAL HIP ARTHROPLASTY Right 07/17/2020   Procedure: RIGHT TOTAL HIP ARTHROPLASTY ANTERIOR APPROACH;  Surgeon: Frederik Pear, MD;  Location: [facility];  Service: Orthopedics;  Laterality: Right;   TOTAL HIP ARTHROPLASTY Left 12/11/2020   Procedure: LEFT TOTAL HIP ARTHROPLASTY ANTERIOR APPROACH;  Surgeon: Frederik Pear, MD;  Location: [facility];  Service: Orthopedics;  Laterality: Left;   Patient Active Problem List   Diagnosis Date Noted   Vitamin D deficiency 05/17/2022   Closed fracture of lateral portion of left tibial plateau 05/16/2022   S/P hip replacement, left 12/11/2020   AVN (avascular necrosis of bone) ([place]) 12/06/2020   AVN of femur ([place]) 07/07/2020    REFERRING DIAG: Left tibial plateau fracture     THERAPY DIAG:  No diagnosis found.  Rationale for Evaluation and Treatment Rehabilitation  PERTINENT HISTORY: ORIF L tibial plateus fx and meniscus repair with Dr [name] 05-16-22 Bil THR due to AVN   PRECAUTIONS:  Yes NWB for 6 weeks  from surgery  from Ainsley Spinner secure message. Brace for 2 weeks and then WBAT for 2 weeks in brace before weaning.  Wean from brace after November 28th.   SUBJECTIVE:                                                                                                                                                                                      SUBJECTIVE STATEMENT: " the knee is getting better, still swollen. The main issue is the foot and ankle being stiff. The hands on treatment has helped."  PAIN:  Are you having pain? Yes: NPRS scale: 5/10 Pain location: Lt knee Pain description: ache Aggravating factors: movement Relieving factors: medicine   OBJECTIVE: (objective measures completed at initial evaluation unless otherwise dated)  DIAGNOSTIC FINDINGS:  05-16-22 There is internal fixation of comminuted fracture in the medial tibial plateau with metallic plate and surgical screws. There is is surgical drain in the soft tissues along the lateral aspect. There is possible minimal effusion in suprapatellar bursa.   IMPRESSION: Status post internal fixation of comminuted fracture of lateral plateau of left tibia.     PATIENT SURVEYS:  LEFS 21/80  26.3% disability   COGNITION: Overall cognitive status: Within functional limits for tasks assessed                         SENSATION: Numbness on top of ankle and down Left shin bone   EDEMA:  Circumferential: Left knee 43 cm  R 39 cm   MUSCLE LENGTH: Hamstrings: Right 74 deg; Left 60 deg     POSTURE: rounded shoulders, forward head, and tall and within normal BMI   PALPATION: TTP over medial knee down into shin and on top of ankle   LOWER EXTREMITY ROM:   Active ROM Right eval Left eval 07/10/22 Left 07/17/22 Left 07-23-22 Left  Hip flexion         Hip extension         Hip abduction         Hip adduction         Hip internal rotation         Hip external rotation         Knee flexion 135/P139 105/P109 104 106  active 110 Active  Knee extension 0/+4 hyperext 12deficit/P 10 deficit  -5 active -3 active  Ankle dorsiflexion 9 -11defict/stiff  -20 active supine -17 active supine  Ankle plantarflexion         Ankle inversion         Ankle eversion          (Blank rows = not tested)   LOWER EXTREMITY MMT:   MMT Right eval Left eval  Hip flexion 5 5  Hip extension      Hip abduction 5    Hip adduction      Hip internal rotation      Hip external rotation      Knee flexion 5 3-  Knee extension 5 3-   Ankle dorsiflexion 5    Ankle plantarflexion      Ankle inversion      Ankle eversion       (Blank rows = not tested)     FUNCTIONAL TESTS:  5 times sit to stand: 17.34 sec and Wt bears to Right   GAIT: Distance walked: 250 ft  Assistive device utilized: Crutches Level of assistance: Complete Independence Comments: Pt able to bear wt in Left LE but wt bears to right.  Pt without brace as per MD order and did not attempt stairs at eval  TREAT MENT______________________________________________________________________  [facility] Adult PT Treatment:                                                DATE: 07/29/22 Therapeutic Exercise: Supine calf stretch 2 x 30 sec with strap  Recumbent bike L 2 x 5  min (seat lowered at 2:30 to promote knee flexion) Mini squat 2 x 10 from freemotion for stability  Standing calf stretch 2 x 30 sec on slant board (after bike) Standing hamstring curl 2 x 12 1 set unweighted, 2nd set with 3#  Seated bil LE ankle DF/ PF using rockerboard  to help facilitate ROM Seated heel slide 1 x 20 with cues to keep heel down Manual Therapy: MTPR along the L gastroc Talocrural distraction grade III, talocrural AP mobs grade IV  Therapeutic Activity: Gait training 4 x 30 ft verbal cues to focus on heel strike   [facility] Adult PT Treatment:                                                DATE: 07-25-22 Therapeutic Exercise: Recumbent bike 5 min  level 3 resistance Slant board L LE  1 min x 3 on LT.  Step ups 3 x 15 forward 6 inch step Lateral step ups 3 x 15 6 inch step TKE with BTB  3 x 15 Deep lunge on 6 inch step with RT foot on 6 inch step  to increase hip flex/ ankle DF 2 x 15 Deep squat at sink 15 x to pt tolerance   Updated HEP to include standing hamstring curl and seated heel slides.  Manual Therapy: Ankle PROM, DF mobs, distal fib glides   Prone knee/quad stretch Lateral ankle mobs in prone on LT LE PA grade iii/III Talar mobs Gait    Gait training on steps up and down with alternating step.  , gait with ER of LT foot with VC for correction    [facility] Adult PT Treatment:                                                DATE: 07-23-22 Therapeutic Exercise:  TKE with GTB DF stretch with green stretch out strap Slant board L LE 1 min x 2 on LT.  Deep squat at sink 10 x to pt tolerance Step ups 3 x 10 forward 6 inch step Lateral step ups 3 x 10 6 inch step Manual Therapy: Ankle PROM, DF mobs, distal fib glides   Gait    wt shift side to side  Wt shift with LT foot forward Wt shift with RT foot forward  Self Care: How to wear  brace correctly with return demo for uneven surfaces and public walking only Sleep hygiene      PATIENT EDUCATION:  Education details: continue HEP Person educated: Patient Education method: Explanation Education comprehension: verbalized understanding   HOME EXERCISE PROGRAM: Access Code: [email or web address] URL: [email or web address] Date: 07/31/2022 Prepared by: Starr Lake  Exercises - Supine active Straight Leg Raise in Brace until July 16, 2022  - 1 x daily - 7 x weekly - 3 sets - 10 reps - Long Sitting Quad Set with Towel Roll Under Heel  - 1 x daily - 7 x weekly - 3 sets - 10 reps - Supine Heel Slide  - 1 x daily - 7 x weekly - 3 sets - 10 reps - Seated Calf Stretch with Strap  - 1 x daily - 7 x weekly - 3 sets - 3 reps - 30-60-sec hold -  Standing Gastroc Stretch  - 1 x daily - 7 x weekly - 1  sets - 10 reps - 5-10 hold - Mini Squat with Counter Support  - 1 x daily - 7 x weekly - 2 sets - 10 reps - Sidelying Hip Abduction  - 1 x daily - 7 x weekly - 2 sets - 10 reps - Step Up  - 1 x daily - 7 x weekly - 3 sets - 10 reps - Lateral Step Up  - 1 x daily - 7 x weekly - 3 sets - 10 reps - Standing Terminal Knee Extension with Resistance  - 1 x daily - 7 x weekly - 3 sets - 10 reps - Standing Knee Flexion  - 1 x daily - 7 x weekly - 2 sets - 10 reps - Seated Heel Slide  - 1 x daily - 7 x weekly - 2 sets - 10 reps   ASSESSMENT:   CLINICAL IMPRESSION: Curt arrives to PT today noting improvement in the knee / ankle mobility and reduction in pain. He does continue to describe swelling in both the knee/ ankle that fluctuates. Continued working on ankle ROM with emphasis on DF with mobs and STW along the gastroc/ soleus. Continued gross LE strengthening which he did well with and practiced gait training avoiding toe out pattern and utilzing heel strike to maximize DF. End of session he noted feeling better and improvement in mobility.     OBJECTIVE IMPAIRMENTS: difficulty walking, decreased ROM, decreased strength, increased edema, and pain.    ACTIVITY LIMITATIONS: standing, squatting, transfers, bathing, dressing, and hygiene/grooming   PARTICIPATION LIMITATIONS: community activity, occupation, and return to exercise and recreational golf /hiking   PERSONAL FACTORS: 1 comorbidity: BIL THA for AVN  are also affecting patient's functional outcome.    REHAB POTENTIAL: Excellent   CLINICAL DECISION MAKING: Evolving/moderate complexity   EVALUATION COMPLEXITY: Moderate     GOALS: Goals reviewed with patient? Yes   SHORT TERM GOALS: Target date: 07-23-22  Pt will be independent with initial HEP Baseline: no knowledge Status: 07/17/22 : independent Goal status: MET   2.  Pt will return to bed to sleep rather than sleeping in recliner Baseline: eval must sleep in recliner Status:  07/17/22: has slept in bed 2 times.  Goal status: ONGOING   3.  Pain will decrease to 4/10 at worst/standing bending knee Baseline: eval pain up to 8/10 Status: 07/17/22: 7/10  07-23-22  7/10 Goal status: ONGOING   4.  L knee AROM flexion will improve to 5-115 degrees for improved mobility to ride in car without increased pain Baseline: See AROM chart Status: 07/17/22: 5-106 degrees Status 07-23-22  3-110 degrees Goal status: PARTIALLY MET   5.  Pt will be able to stand with even wt distribution in bil LE Baseline: Pt wt bears to right Status: 07/17/22: continues to bear wt to RLE, needs cues 07-23-22  worked on wt bearing activities. Goal status: ONGOING   6.  Pt with DF of L will be at 0 neutral             Baseline  -11 from neutral  Status: -20 from neutral   Status -17 from neutral            Goal Status: ONGOING     LONG TERM GOALS: Target date: 08/13/2022    Pt will be independent with advanced HEP Baseline: No knowledge Goal status: INITIAL   2.  Pt will be able to  walk 2 miles for exercise Baseline: unable to stand for greater than 10 min Goal status: ONGOING   3.  Pt will be able to get back to recreational pursuits, hiking , golf and kayaking Baseline: unable to participate due to knee surgery Goal status: ONGOING   4.  PT with be able to walk/stand >/= 1 hour with no AD with </= 2/10 pain for functional endurance and return to leisure activities  Baseline: Pt unable to stand for greater than 10 min with 6-8/10 pain Goal status: INITIAL   5.  Pt will be able to negotiate steps without any AD and with safe execution Baseline: unable at eval due to not having brace Goal status: ONGOING   6.  Patient will report improved functional level on LEFS >/= 70/80 in order to allow for return to prior exercise level   Baseline: LEFS 21/80 26.3 % disability Goal status: ONGOING     PLAN:   PT FREQUENCY: 2x/week   PT DURATION: 6 weeks   PLANNED INTERVENTIONS:  Therapeutic exercises, Therapeutic activity, Neuromuscular re-education, Balance training, Gait training, Patient/Family education, Self Care, Joint mobilization, Stair training, DME instructions, Dry Needling, Electrical stimulation, Cryotherapy, Moist heat, scar mobilization, Taping, Manual therapy, and Re-evaluation   PLAN FOR NEXT SESSION: Review HEP,  knee ROM and strengthening; gait training    Kaci Freel PT, DPT, LAT, ATC  07/31/22  11:02 AM

## 2022-08-05 ENCOUNTER — Ambulatory Visit: Payer: Medicaid Other | Admitting: Physical Therapy

## 2022-08-05 ENCOUNTER — Encounter: Payer: Self-pay | Admitting: Physical Therapy

## 2022-08-05 DIAGNOSIS — M25562 Pain in left knee: Secondary | ICD-10-CM

## 2022-08-05 DIAGNOSIS — R262 Difficulty in walking, not elsewhere classified: Secondary | ICD-10-CM

## 2022-08-05 DIAGNOSIS — M25552 Pain in left hip: Secondary | ICD-10-CM

## 2022-08-05 NOTE — Therapy (Signed)
OUTPATIENT PHYSICAL THERAPY TREATMENT NOTE   Patient Name: Keith Alvarado MRN: [id number] DOB:[birth year], 46 y.o., male Today's Date: 08/05/2022  PCP: none REFERRING PROVIDER: Ainsley Spinner, PA-C    END OF SESSION:   PT End of Session - 08/05/22 1026     Visit Number 7    Number of Visits 16    Date for PT Re-Evaluation 08/27/22    Authorization Type [place] MCD access    Authorization Time Period 07/23/11/31/2023    Authorization - Visit Number 2    Authorization - Number of Visits 8    PT Start Time 1026   pt arrived late to PT   PT Stop Time 1106    PT Time Calculation (min) 40 min    Activity Tolerance Patient tolerated treatment well    Behavior During Therapy [facility] for tasks assessed/performed                Past Medical History:  Diagnosis Date   Medical history non-contributory    Vitamin D deficiency 05/17/2022   Past Surgical History:  Procedure Laterality Date   FOOT SURGERY Left    JOINT REPLACEMENT Right 2021   ORIF TIBIA PLATEAU Left 05/16/2022   Procedure: OPEN REDUCTION INTERNAL FIXATION (ORIF) TIBIAL PLATEAU;  Surgeon: Altamese Max, MD;  Location: [place];  Service: Orthopedics;  Laterality: Left;   TOTAL HIP ARTHROPLASTY Right 07/17/2020   Procedure: RIGHT TOTAL HIP ARTHROPLASTY ANTERIOR APPROACH;  Surgeon: Frederik Pear, MD;  Location[facility];  Service: Orthopedics;  Laterality: Right;   TOTAL HIP ARTHROPLASTY Left 12/11/2020   Procedure: LEFT TOTAL HIP ARTHROPLASTY ANTERIOR APPROACH;  Surgeon: Frederik Pear, MD;  Location: [facility];  Service: Orthopedics;  Laterality: Left;   Patient Active Problem List   Diagnosis Date Noted   Vitamin D deficiency 05/17/2022   Closed fracture of lateral portion of left tibial plateau 05/16/2022   S/P hip replacement, left 12/11/2020   AVN (avascular necrosis of bone) ([place]) 12/06/2020   AVN of femur ([place]) 07/07/2020    REFERRING DIAG: Left tibial plateau fracture     THERAPY DIAG:  Difficulty in walking,  not elsewhere classified  Acute pain of left knee  Pain in left hip  Rationale for Evaluation and Treatment Rehabilitation  PERTINENT HISTORY: ORIF L tibial plateus fx and meniscus repair with Dr [name] 05-16-22 Bil THR due to AVN   PRECAUTIONS:  Yes NWB for 6 weeks from surgery  from Ainsley Spinner secure message. Brace for 2 weeks and then WBAT for 2 weeks in brace before weaning.  Wean from brace after November 28th.   SUBJECTIVE:  SUBJECTIVE STATEMENT: " Did a lot of walking and my ankle is more swollen."  PAIN:  Are you having pain? Yes: NPRS scale: 8/10 Pain location: Lt knee Pain description: ache Aggravating factors: movement Relieving factors: medicine   OBJECTIVE: (objective measures completed at initial evaluation unless otherwise dated)  DIAGNOSTIC FINDINGS:  05-16-22 There is internal fixation of comminuted fracture in the medial tibial plateau with metallic plate and surgical screws. There is is surgical drain in the soft tissues along the lateral aspect. There is possible minimal effusion in suprapatellar bursa.   IMPRESSION: Status post internal fixation of comminuted fracture of lateral plateau of left tibia.     PATIENT SURVEYS:  LEFS 21/80  26.3% disability   COGNITION: Overall cognitive status: Within functional limits for tasks assessed                         SENSATION: Numbness on top of ankle and down Left shin bone   EDEMA:  Circumferential: Left knee 43 cm  R 39 cm   MUSCLE LENGTH: Hamstrings: Right 74 deg; Left 60 deg     POSTURE: rounded shoulders, forward head, and tall and within normal BMI   PALPATION: TTP over medial knee down into shin and on top of ankle   LOWER EXTREMITY ROM:   Active ROM Right eval Left eval 07/10/22 Left 07/17/22 Left  07-23-22 Left  Hip flexion         Hip extension         Hip abduction         Hip adduction         Hip internal rotation         Hip external rotation         Knee flexion 135/P139 105/P109 104 106 active 110 Active  Knee extension 0/+4 hyperext 12deficit/P 10 deficit  -5 active -3 active  Ankle dorsiflexion 9 -11defict/stiff  -20 active supine -17 active supine  Ankle plantarflexion         Ankle inversion         Ankle eversion          (Blank rows = not tested)   LOWER EXTREMITY MMT:   MMT Right eval Left eval  Hip flexion 5 5  Hip extension      Hip abduction 5    Hip adduction      Hip internal rotation      Hip external rotation      Knee flexion 5 3-  Knee extension 5 3-   Ankle dorsiflexion 5    Ankle plantarflexion      Ankle inversion      Ankle eversion       (Blank rows = not tested)     FUNCTIONAL TESTS:  5 times sit to stand: 17.34 sec and Wt bears to Right   GAIT: Distance walked: 250 ft  Assistive device utilized: Crutches Level of assistance: Complete Independence Comments: Pt able to bear wt in Left LE but wt bears to right.  Pt without brace as per MD order and did not attempt stairs at eval  TREAT MENT______________________________________________________________________  [facility] Adult PT Treatment:                                                DATE: 08/05/2022 Therapeutic Exercise: Ankle  pumps/ ABC in prone with knee flexed x 2 Prone hamstring curl 2 x 12 Prone glute set 1 x 10 holindg 5 sec  Sidelying hip abduction 2 x 15 LLE only Bridge 2 x 10 SLR LLE 2 x 10 Seated heel/ toe raise 2 x 10 LLE using rocker board Seated rocker board inversion/ eversion 2 x 20  Recumbent bike L 3 x 5 min  Manual Therapy: L ankle edema reduction massage    [facility] Adult PT Treatment:                                                DATE: 07/29/22 Therapeutic Exercise: Supine calf stretch 2 x 30 sec with strap  Recumbent bike L 2 x 5 min (seat lowered at  2:30 to promote knee flexion) Mini squat 2 x 10 from freemotion for stability  Standing calf stretch 2 x 30 sec on slant board (after bike) Standing hamstring curl 2 x 12 1 set unweighted, 2nd set with 3#  Seated bil LE ankle DF/ PF using rockerboard  to help facilitate ROM Seated heel slide 1 x 20 with cues to keep heel down Manual Therapy: MTPR along the L gastroc Talocrural distraction grade III, talocrural AP mobs grade IV  Therapeutic Activity: Gait training 4 x 30 ft verbal cues to focus on heel strike   [facility] Adult PT Treatment:                                                DATE: 07-25-22 Therapeutic Exercise: Recumbent bike 5 min  level 3 resistance Slant board L LE 1 min x 3 on LT.  Step ups 3 x 15 forward 6 inch step Lateral step ups 3 x 15 6 inch step TKE with BTB  3 x 15 Deep lunge on 6 inch step with RT foot on 6 inch step  to increase hip flex/ ankle DF 2 x 15 Deep squat at sink 15 x to pt tolerance   Updated HEP to include standing hamstring curl and seated heel slides.  Manual Therapy: Ankle PROM, DF mobs, distal fib glides   Prone knee/quad stretch Lateral ankle mobs in prone on LT LE PA grade iii/III Talar mobs Gait    Gait training on steps up and down with alternating step.  , gait with ER of LT foot with VC for correction     PATIENT EDUCATION:  Education details: continue HEP Person educated: Patient Education method: Explanation Education comprehension: verbalized understanding   HOME EXERCISE PROGRAM: Access Code: [email or web address] URL: [email or web address] Date: 07/31/2022 Prepared by: Starr Lake  Exercises - Supine active Straight Leg Raise in Brace until July 16, 2022  - 1 x daily - 7 x weekly - 3 sets - 10 reps - Long Sitting Quad Set with Towel Roll Under Heel  - 1 x daily - 7 x weekly - 3 sets - 10 reps - Supine Heel Slide  - 1 x daily - 7 x weekly - 3 sets - 10 reps - Seated Calf Stretch with Strap  - 1 x daily - 7 x  weekly - 3 sets - 3 reps - 30-60-sec hold - Standing Gastroc Stretch  - 1 x daily - 7  x weekly - 1 sets - 10 reps - 5-10 hold - Mini Squat with Counter Support  - 1 x daily - 7 x weekly - 2 sets - 10 reps - Sidelying Hip Abduction  - 1 x daily - 7 x weekly - 2 sets - 10 reps - Step Up  - 1 x daily - 7 x weekly - 3 sets - 10 reps - Lateral Step Up  - 1 x daily - 7 x weekly - 3 sets - 10 reps - Standing Terminal Knee Extension with Resistance  - 1 x daily - 7 x weekly - 3 sets - 10 reps - Standing Knee Flexion  - 1 x daily - 7 x weekly - 2 sets - 10 reps - Seated Heel Slide  - 1 x daily - 7 x weekly - 2 sets - 10 reps   ASSESSMENT:   CLINICAL IMPRESSION: Keith Alvarado arrives to session noting increaesd L ankle pain and swelling secondary to increased walking/ standing. Worked on ankle edema with reduction massage then followed with ankle ROM. Continued working on LLE strengthening in prone/ supine to continued strengthening while taking some weight off the ankle. He did well with all exercises but does continue to fatigue quickly. End of session he reported feeling better than when he arrived.       OBJECTIVE IMPAIRMENTS: difficulty walking, decreased ROM, decreased strength, increased edema, and pain.    ACTIVITY LIMITATIONS: standing, squatting, transfers, bathing, dressing, and hygiene/grooming   PARTICIPATION LIMITATIONS: community activity, occupation, and return to exercise and recreational golf /hiking   PERSONAL FACTORS: 1 comorbidity: BIL THA for AVN  are also affecting patient's functional outcome.    REHAB POTENTIAL: Excellent   CLINICAL DECISION MAKING: Evolving/moderate complexity   EVALUATION COMPLEXITY: Moderate     GOALS: Goals reviewed with patient? Yes   SHORT TERM GOALS: Target date: 07-23-22  Pt will be independent with initial HEP Baseline: no knowledge Status: 07/17/22 : independent Goal status: MET   2.  Pt will return to bed to sleep rather than sleeping  in recliner Baseline: eval must sleep in recliner Status: 07/17/22: has slept in bed 2 times.  Goal status: ONGOING   3.  Pain will decrease to 4/10 at worst/standing bending knee Baseline: eval pain up to 8/10 Status: 07/17/22: 7/10  07-23-22  7/10 Goal status: ONGOING   4.  L knee AROM flexion will improve to 5-115 degrees for improved mobility to ride in car without increased pain Baseline: See AROM chart Status: 07/17/22: 5-106 degrees Status 07-23-22  3-110 degrees Goal status: PARTIALLY MET   5.  Pt will be able to stand with even wt distribution in bil LE Baseline: Pt wt bears to right Status: 07/17/22: continues to bear wt to RLE, needs cues 07-23-22  worked on wt bearing activities. Goal status: ONGOING   6.  Pt with DF of L will be at 0 neutral             Baseline  -11 from neutral  Status: -20 from neutral   Status -17 from neutral            Goal Status: ONGOING     LONG TERM GOALS: Target date: 08/13/2022    Pt will be independent with advanced HEP Baseline: No knowledge Goal status: INITIAL   2.  Pt will be able to walk 2 miles for exercise Baseline: unable to stand for greater than 10 min Goal status: ONGOING   3.  Pt will be able to get back to recreational pursuits, hiking , golf and kayaking Baseline: unable to participate due to knee surgery Goal status: ONGOING   4.  PT with be able to walk/stand >/= 1 hour with no AD with </= 2/10 pain for functional endurance and return to leisure activities  Baseline: Pt unable to stand for greater than 10 min with 6-8/10 pain Goal status: INITIAL   5.  Pt will be able to negotiate steps without any AD and with safe execution Baseline: unable at eval due to not having brace Goal status: ONGOING   6.  Patient will report improved functional level on LEFS >/= 70/80 in order to allow for return to prior exercise level   Baseline: LEFS 21/80 26.3 % disability Goal status: ONGOING     PLAN:   PT FREQUENCY:  2x/week   PT DURATION: 6 weeks   PLANNED INTERVENTIONS: Therapeutic exercises, Therapeutic activity, Neuromuscular re-education, Balance training, Gait training, Patient/Family education, Self Care, Joint mobilization, Stair training, DME instructions, Dry Needling, Electrical stimulation, Cryotherapy, Moist heat, scar mobilization, Taping, Manual therapy, and Re-evaluation   PLAN FOR NEXT SESSION: Review HEP,  knee ROM and strengthening; gait training    Aamori Mcmasters PT, DPT, LAT, ATC  08/05/22  11:19 AM

## 2022-08-07 ENCOUNTER — Ambulatory Visit: Payer: Medicaid Other | Admitting: Physical Therapy

## 2022-08-07 DIAGNOSIS — M25562 Pain in left knee: Secondary | ICD-10-CM

## 2022-08-07 DIAGNOSIS — M25552 Pain in left hip: Secondary | ICD-10-CM

## 2022-08-07 DIAGNOSIS — R262 Difficulty in walking, not elsewhere classified: Secondary | ICD-10-CM | POA: Diagnosis not present

## 2022-08-07 NOTE — Therapy (Signed)
OUTPATIENT PHYSICAL THERAPY TREATMENT NOTE   Patient Name: Keith Alvarado MRN: [id number] DOB:20-Dec-1975, 46 y.o., male Today's Date: 08/07/2022  PCP: none REFERRING PROVIDER: Ainsley Spinner, PA-C    END OF SESSION:   PT End of Session - 08/07/22 1112     Visit Number 8    Number of Visits 16    Date for PT Re-Evaluation 08/27/22    Authorization Type [place] MCD access    Authorization Time Period 12/4/ - 08/18/2022    Authorization - Visit Number 3    Authorization - Number of Visits 8    PT Start Time 1112   pr arrived late   PT Stop Time 1153    PT Time Calculation (min) 41 min                 Past Medical History:  Diagnosis Date   Medical history non-contributory    Vitamin D deficiency 05/17/2022   Past Surgical History:  Procedure Laterality Date   FOOT SURGERY Left    JOINT REPLACEMENT Right 2021   ORIF TIBIA PLATEAU Left 05/16/2022   Procedure: OPEN REDUCTION INTERNAL FIXATION (ORIF) TIBIAL PLATEAU;  Surgeon: Altamese Milliken, MD;  Location: [facility];  Service: Orthopedics;  Laterality: Left;   TOTAL HIP ARTHROPLASTY Right 07/17/2020   Procedure: RIGHT TOTAL HIP ARTHROPLASTY ANTERIOR APPROACH;  Surgeon: Frederik Pear, MD;  [facility];  Service: Orthopedics;  Laterality: Right;   TOTAL HIP ARTHROPLASTY Left 12/11/2020   Procedure: LEFT TOTAL HIP ARTHROPLASTY ANTERIOR APPROACH;  Surgeon: Frederik Pear, MD;  [facility];  Service: Orthopedics;  Laterality: Left;   Patient Active Problem List   Diagnosis Date Noted   Vitamin D deficiency 05/17/2022   Closed fracture of lateral portion of left tibial plateau 05/16/2022   S/P hip replacement, left 12/11/2020   AVN (avascular necrosis of bone) ([place]) 12/06/2020   AVN of femur ([place]) 07/07/2020    REFERRING DIAG: Left tibial plateau fracture     THERAPY DIAG:  Difficulty in walking, not elsewhere classified  Acute pain of left knee  Pain in left hip  Rationale for Evaluation and Treatment  Rehabilitation  PERTINENT HISTORY: ORIF L tibial plateus fx and meniscus repair with Dr [name] 05-16-22 Bil THR due to AVN   PRECAUTIONS:  Yes NWB for 6 weeks from surgery  from Ainsley Spinner secure message. Brace for 2 weeks and then WBAT for 2 weeks in brace before weaning.  Wean from brace after November 28th.   SUBJECTIVE:  SUBJECTIVE STATEMENT: " I am doing better today. The last session was just right, that I wasn't too sore but could tell I got some work."  PAIN:  Are you having pain? Yes: NPRS scale: (last took meds for pain this AM.6/10 Pain location: Lt knee Pain description: ache Aggravating factors: movement Relieving factors: medicine   OBJECTIVE: (objective measures completed at initial evaluation unless otherwise dated)  DIAGNOSTIC FINDINGS:  05-16-22 There is internal fixation of comminuted fracture in the medial tibial plateau with metallic plate and surgical screws. There is is surgical drain in the soft tissues along the lateral aspect. There is possible minimal effusion in suprapatellar bursa.   IMPRESSION: Status post internal fixation of comminuted fracture of lateral plateau of left tibia.     PATIENT SURVEYS:  LEFS 21/80  26.3% disability   COGNITION: Overall cognitive status: Within functional limits for tasks assessed                         SENSATION: Numbness on top of ankle and down Left shin bone   EDEMA:  Circumferential: Left knee 43 cm  R 39 cm   MUSCLE LENGTH: Hamstrings: Right 74 deg; Left 60 deg     POSTURE: rounded shoulders, forward head, and tall and within normal BMI   PALPATION: TTP over medial knee down into shin and on top of ankle   LOWER EXTREMITY ROM:   Active ROM Right eval Left eval 07/10/22 Left 07/17/22 Left 07-23-22 Left  Hip flexion          Hip extension         Hip abduction         Hip adduction         Hip internal rotation         Hip external rotation         Knee flexion 135/P139 105/P109 104 106 active 110 Active  Knee extension 0/+4 hyperext 12deficit/P 10 deficit  -5 active -3 active  Ankle dorsiflexion 9 -11defict/stiff  -20 active supine -17 active supine  Ankle plantarflexion         Ankle inversion         Ankle eversion          (Blank rows = not tested)   LOWER EXTREMITY MMT:   MMT Right eval Left eval  Hip flexion 5 5  Hip extension      Hip abduction 5    Hip adduction      Hip internal rotation      Hip external rotation      Knee flexion 5 3-  Knee extension 5 3-   Ankle dorsiflexion 5    Ankle plantarflexion      Ankle inversion      Ankle eversion       (Blank rows = not tested)     FUNCTIONAL TESTS:  5 times sit to stand: 17.34 sec and Wt bears to Right   GAIT: Distance walked: 250 ft  Assistive device utilized: Crutches Level of assistance: Complete Independence Comments: Pt able to bear wt in Left LE but wt bears to right.  Pt without brace as per MD order and did not attempt stairs at eval  TREAT MENT______________________________________________________________________  [facility] Adult PT Treatment:  DATE: 08/07/2022 Therapeutic Exercise: Recumbent bike L5 x 5 min - seat lowered at 2:30 sec to promote knee flexion Slant board calf stretch 2 x 30  Leg press (cybex)1 x 15 20#,  2 x 10 20# LLE only Between leg press sets performed 2 x 20 heel raised 20# Seated knee extension 2 x 10 with 10# , 1 set bil LE, 2nd set con bil/ ecc L only Seated hamstring curl 2 x 10 with 25# 1 set bil LE, 2nd set con bil/ ecc L only   [facility] Adult PT Treatment:                                                DATE: 08/05/2022 Therapeutic Exercise: Ankle pumps/ ABC in prone with knee flexed x 2 Prone hamstring curl 2 x 12 Prone glute set 1 x 10 holindg  5 sec  Sidelying hip abduction 2 x 15 LLE only Bridge 2 x 10 SLR LLE 2 x 10 Seated heel/ toe raise 2 x 10 LLE using rocker board Seated rocker board inversion/ eversion 2 x 20  Recumbent bike L 3 x 5 min  Manual Therapy: L ankle edema reduction massage    [facility] Adult PT Treatment:                                                DATE: 07/29/22 Therapeutic Exercise: Supine calf stretch 2 x 30 sec with strap  Recumbent bike L 2 x 5 min (seat lowered at 2:30 to promote knee flexion) Mini squat 2 x 10 from freemotion for stability  Standing calf stretch 2 x 30 sec on slant board (after bike) Standing hamstring curl 2 x 12 1 set unweighted, 2nd set with 3#  Seated bil LE ankle DF/ PF using rockerboard  to help facilitate ROM Seated heel slide 1 x 20 with cues to keep heel down Manual Therapy: MTPR along the L gastroc Talocrural distraction grade III, talocrural AP mobs grade IV  Therapeutic Activity: Gait training 4 x 30 ft verbal cues to focus on heel strike    PATIENT EDUCATION:  Education details: continue HEP Person educated: Patient Education method: Explanation Education comprehension: verbalized understanding   HOME EXERCISE PROGRAM: Access Code: [email or web address] URL: [email or web address] Date: 07/31/2022 Prepared by: Starr Lake  Exercises - Supine active Straight Leg Raise in Brace until July 16, 2022  - 1 x daily - 7 x weekly - 3 sets - 10 reps - Long Sitting Quad Set with Towel Roll Under Heel  - 1 x daily - 7 x weekly - 3 sets - 10 reps - Supine Heel Slide  - 1 x daily - 7 x weekly - 3 sets - 10 reps - Seated Calf Stretch with Strap  - 1 x daily - 7 x weekly - 3 sets - 3 reps - 30-60-sec hold - Standing Gastroc Stretch  - 1 x daily - 7 x weekly - 1 sets - 10 reps - 5-10 hold - Mini Squat with Counter Support  - 1 x daily - 7 x weekly - 2 sets - 10 reps - Sidelying Hip Abduction  - 1 x daily - 7 x weekly - 2 sets - 10 reps - Step  Up  - 1 x daily - 7  x weekly - 3 sets - 10 reps - Lateral Step Up  - 1 x daily - 7 x weekly - 3 sets - 10 reps - Standing Terminal Knee Extension with Resistance  - 1 x daily - 7 x weekly - 3 sets - 10 reps - Standing Knee Flexion  - 1 x daily - 7 x weekly - 2 sets - 10 reps - Seated Heel Slide  - 1 x daily - 7 x weekly - 2 sets - 10 reps   ASSESSMENT:   CLINICAL IMPRESSION: Pt arrives to session noting he is doing better today than the last session. Continued working on gross LLE strengthening utilizing equipment with increased reps/ sets to continue with strengthening and encourage endurance training. Overall he did very well but does demonstrate some apprehension with starting new exercises which eases after a few sets. He continues to demonstrate an antalgic gait pattern that he is able to reduce with verbal cues for heel strike/ toe off and taking smaller steps. End of session he noted no increase in pain.       OBJECTIVE IMPAIRMENTS: difficulty walking, decreased ROM, decreased strength, increased edema, and pain.    ACTIVITY LIMITATIONS: standing, squatting, transfers, bathing, dressing, and hygiene/grooming   PARTICIPATION LIMITATIONS: community activity, occupation, and return to exercise and recreational golf /hiking   PERSONAL FACTORS: 1 comorbidity: BIL THA for AVN  are also affecting patient's functional outcome.    REHAB POTENTIAL: Excellent   CLINICAL DECISION MAKING: Evolving/moderate complexity   EVALUATION COMPLEXITY: Moderate     GOALS: Goals reviewed with patient? Yes   SHORT TERM GOALS: Target date: 07-23-22  Pt will be independent with initial HEP Baseline: no knowledge Status: 07/17/22 : independent Goal status: MET   2.  Pt will return to bed to sleep rather than sleeping in recliner Baseline: eval must sleep in recliner Status: 07/17/22: has slept in bed 2 times.  Goal status: ONGOING   3.  Pain will decrease to 4/10 at worst/standing bending knee Baseline: eval pain  up to 8/10 Status: 07/17/22: 7/10  07-23-22  7/10 Goal status: ONGOING   4.  L knee AROM flexion will improve to 5-115 degrees for improved mobility to ride in car without increased pain Baseline: See AROM chart Status: 07/17/22: 5-106 degrees Status 07-23-22  3-110 degrees Goal status: PARTIALLY MET   5.  Pt will be able to stand with even wt distribution in bil LE Baseline: Pt wt bears to right Status: 07/17/22: continues to bear wt to RLE, needs cues 07-23-22  worked on wt bearing activities. Goal status: partially met   6.  Pt with DF of L will be at 0 neutral             Baseline  -11 from neutral  Status: -20 from neutral   Status -17 from neutral            Goal Status: ONGOING     LONG TERM GOALS: Target date: 08/13/2022    Pt will be independent with advanced HEP Baseline: No knowledge Goal status: INITIAL   2.  Pt will be able to walk 2 miles for exercise Baseline: unable to stand for greater than 10 min Goal status: ONGOING   3.  Pt will be able to get back to recreational pursuits, hiking , golf and kayaking Baseline: unable to participate due to knee surgery Goal status: ONGOING   4.  PT with  be able to walk/stand >/= 1 hour with no AD with </= 2/10 pain for functional endurance and return to leisure activities  Baseline: Pt unable to stand for greater than 10 min with 6-8/10 pain Goal status: INITIAL   5.  Pt will be able to negotiate steps without any AD and with safe execution Baseline: unable at eval due to not having brace Goal status: ONGOING   6.  Patient will report improved functional level on LEFS >/= 70/80 in order to allow for return to prior exercise level   Baseline: LEFS 21/80 26.3 % disability Goal status: ONGOING     PLAN:   PT FREQUENCY: 2x/week   PT DURATION: 6 weeks   PLANNED INTERVENTIONS: Therapeutic exercises, Therapeutic activity, Neuromuscular re-education, Balance training, Gait training, Patient/Family education, Self Care,  Joint mobilization, Stair training, DME instructions, Dry Needling, Electrical stimulation, Cryotherapy, Moist heat, scar mobilization, Taping, Manual therapy, and Re-evaluation   PLAN FOR NEXT SESSION: Review HEP,  knee ROM and strengthening; gait training. (Will need to resubmit after the 28th for the beginning of the year for more visits), reassess short term goals.    Taegan Haider PT, DPT, LAT, ATC  08/07/22  11:57 AM

## 2022-08-13 ENCOUNTER — Ambulatory Visit: Payer: Medicaid Other | Admitting: Physical Therapy

## 2022-08-13 ENCOUNTER — Encounter: Payer: Self-pay | Admitting: Physical Therapy

## 2022-08-13 NOTE — Therapy (Signed)
OUTPATIENT PHYSICAL THERAPY TREATMENT NOTE   Patient Name: Keith Alvarado MRN: [id number] DOB:1975-08-29, 46 y.o., male Today's Date: 08/15/2022  PCP: none REFERRING PROVIDER: Ainsley Spinner, PA-C    END OF SESSION:   PT End of Session - 08/15/22 1115     Visit Number 9    Number of Visits 16    Date for PT Re-Evaluation 08/27/22    Authorization Type [place] [employer] access    Authorization Time Period 12/4/ - 08/18/2022    Authorization - Number of Visits 9    PT Start Time 1115    PT Stop Time 1145    PT Time Calculation (min) 30 min    Activity Tolerance Patient tolerated treatment well    Behavior During Therapy [facility] for tasks assessed/performed                  Past Medical History:  Diagnosis Date   Medical history non-contributory    Vitamin D deficiency 05/17/2022   Past Surgical History:  Procedure Laterality Date   FOOT SURGERY Left    JOINT REPLACEMENT Right 2021   ORIF TIBIA PLATEAU Left 05/16/2022   Procedure: OPEN REDUCTION INTERNAL FIXATION (ORIF) TIBIAL PLATEAU;  Surgeon: Altamese Irrigon, MD;  Location: [place];  Service: Orthopedics;  Laterality: Left;   TOTAL HIP ARTHROPLASTY Right 07/17/2020   Procedure: RIGHT TOTAL HIP ARTHROPLASTY ANTERIOR APPROACH;  Surgeon: Frederik Pear, MD;  Location: [facility];  Service: Orthopedics;  Laterality: Right;   TOTAL HIP ARTHROPLASTY Left 12/11/2020   Procedure: LEFT TOTAL HIP ARTHROPLASTY ANTERIOR APPROACH;  Surgeon: Frederik Pear, MD;  Location: [facility];  Service: Orthopedics;  Laterality: Left;   Patient Active Problem List   Diagnosis Date Noted   Vitamin D deficiency 05/17/2022   Closed fracture of lateral portion of left tibial plateau 05/16/2022   S/P hip replacement, left 12/11/2020   AVN (avascular necrosis of bone) ([place]) 12/06/2020   AVN of femur ([place]) 07/07/2020    REFERRING DIAG: Left tibial plateau fracture     THERAPY DIAG:  Difficulty in walking, not elsewhere classified  Acute pain of left  knee  Stiffness of left hip, not elsewhere classified  Other abnormalities of gait and mobility  Stiffness of right hip, not elsewhere classified  Pain in right hip  Pain in left hip  Muscle weakness (generalized)  Status post total hip replacement, right  Rationale for Evaluation and Treatment Rehabilitation  PERTINENT HISTORY: ORIF L tibial plateus fx and meniscus repair with Dr [name] 05-16-22 Bil THR due to AVN   PRECAUTIONS:  Yes NWB for 6 weeks from surgery  from Ainsley Spinner secure message. Brace for 2 weeks and then WBAT for 2 weeks in brace before weaning.  Wean from brace after November 28th.   SUBJECTIVE:  SUBJECTIVE STATEMENT:  I am a 5/10. It is tax season and I am doing more walking now.   PAIN:  Are you having pain? Yes: NPRS scale: (last took meds for pain this AM.6/10 Pain location: Lt knee Pain description: ache Aggravating factors: movement Relieving factors: medicine   OBJECTIVE: (objective measures completed at initial evaluation unless otherwise dated)  DIAGNOSTIC FINDINGS:  05-16-22 There is internal fixation of comminuted fracture in the medial tibial plateau with metallic plate and surgical screws. There is is surgical drain in the soft tissues along the lateral aspect. There is possible minimal effusion in suprapatellar bursa.   IMPRESSION: Status post internal fixation of comminuted fracture of lateral plateau of left tibia.     PATIENT SURVEYS:  LEFS 21/80  26.3% disability  08-15-22  LEFS 56/80  70% COGNITION: Overall cognitive status: Within functional limits for tasks assessed                         SENSATION: Numbness on top of ankle and down Left shin bone   EDEMA:  Circumferential: Left knee 43 cm  R 39 cm   MUSCLE LENGTH: Hamstrings: Right 74 deg;  Left 60 deg     POSTURE: rounded shoulders, forward head, and tall and within normal BMI   PALPATION: TTP over medial knee down into shin and on top of ankle   LOWER EXTREMITY ROM:   Active ROM Right eval Left eval 07/10/22 Left 07/17/22 Left 07-23-22 Left 08-15-22 Left  Hip flexion          Hip extension          Hip abduction          Hip adduction          Hip internal rotation          Hip external rotation          Knee flexion 135/P139 105/P109 104 106 active 110 Active 112/PROM 120  Knee extension 0/+4 hyperext 12deficit/P 10 deficit  -5 active -3 active -3 Active  Ankle dorsiflexion 9 -11defict/stiff  -20 active supine -17 active supine   Ankle plantarflexion          Ankle inversion          Ankle eversion           (Blank rows = not tested)   LOWER EXTREMITY MMT:   MMT Right eval Left eval RT/LT 08-15-22  Hip flexion 5 5 5/5  Hip extension       Hip abduction 5   5/4  Hip adduction       Hip internal rotation       Hip external rotation       Knee flexion 5 3- 5/4  Knee extension 5 3-  5/4  Ankle dorsiflexion 5   5/4+  Ankle plantarflexion       Ankle inversion       Ankle eversion        (Blank rows = not tested)     FUNCTIONAL TESTS:  5 times sit to stand: 17.34 sec and Wt bears to Right  5 x STS  13.30 sec GAIT: Distance walked: 250 ft  Assistive device utilized: Crutches Level of assistance: Complete Independence Comments: Pt able to bear wt in Left LE but wt bears to right.  Pt without brace as per MD order and did not attempt stairs at eval  TREAT MENT______________________________________________________________________ [facility] Adult PT Treatment:  DATE: 08-15-22  Therapeutic Exercise: Recumbent bike L5 x 5 min - seat lowered at 2:30 sec to promote knee flexion Slant board calf stretch 2 x 30  Leg press (cybex)1 x 15 25 #,  2 x 10 35# LLE only Between leg press sets performed 2 x 20 heel  raised 25# Seated knee extension 3 x 10 with 15#  with BIL concentric and eccentric lowering LT Seated hamstring curl 2 x 10 with 35# 1 set bil LE, 2nd set con bil/ ecc L only   [facility] Adult PT Treatment:                                                DATE: 08/07/2022 Therapeutic Exercise: Recumbent bike L5 x 5 min - seat lowered at 2:30 sec to promote knee flexion Slant board calf stretch 2 x 30  Leg press (cybex)1 x 12 25#,  2 x 12 35# LLE only Between leg press sets performed 2 x 20 heel raised 20# Seated knee extension 2 x 10 with 1# , 1 set bil LE, 2nd set con bil/ ecc L only Seated hamstring curl 2 x 10 with 25# 1 set bil LE, 2nd set con bil/ ecc L only   [facility] Adult PT Treatment:                                                DATE: 08/05/2022 Therapeutic Exercise: Ankle pumps/ ABC in prone with knee flexed x 2 Prone hamstring curl 2 x 12 Prone glute set 1 x 10 holindg 5 sec  Sidelying hip abduction 2 x 15 LLE only Bridge 2 x 10 SLR LLE 2 x 10 Seated heel/ toe raise 2 x 10 LLE using rocker board Seated rocker board inversion/ eversion 2 x 20  Recumbent bike L 3 x 5 min  Manual Therapy: L ankle edema reduction massage    [facility] Adult PT Treatment:                                                DATE: 07/29/22 Therapeutic Exercise: Supine calf stretch 2 x 30 sec with strap  Recumbent bike L 2 x 5 min (seat lowered at 2:30 to promote knee flexion) Mini squat 2 x 10 from freemotion for stability  Standing calf stretch 2 x 30 sec on slant board (after bike) Standing hamstring curl 2 x 12 1 set unweighted, 2nd set with 3#  Seated bil LE ankle DF/ PF using rockerboard  to help facilitate ROM Seated heel slide 1 x 20 with cues to keep heel down Manual Therapy: MTPR along the L gastroc Talocrural distraction grade III, talocrural AP mobs grade IV  Therapeutic Activity: Gait training 4 x 30 ft verbal cues to focus on heel strike    PATIENT EDUCATION:  Education details: continue  HEP Person educated: Patient Education method: Explanation Education comprehension: verbalized understanding   HOME EXERCISE PROGRAM: Access Code: [email or web address] URL: [email or web address] Date: 07/31/2022 Prepared by: Starr Lake  Exercises - Supine active Straight Leg Raise in Brace until July 16, 2022  - 1 x  daily - 7 x weekly - 3 sets - 10 reps - Long Sitting Quad Set with Towel Roll Under Heel  - 1 x daily - 7 x weekly - 3 sets - 10 reps - Supine Heel Slide  - 1 x daily - 7 x weekly - 3 sets - 10 reps - Seated Calf Stretch with Strap  - 1 x daily - 7 x weekly - 3 sets - 3 reps - 30-60-sec hold - Standing Gastroc Stretch  - 1 x daily - 7 x weekly - 1 sets - 10 reps - 5-10 hold - Mini Squat with Counter Support  - 1 x daily - 7 x weekly - 2 sets - 10 reps - Sidelying Hip Abduction  - 1 x daily - 7 x weekly - 2 sets - 10 reps - Step Up  - 1 x daily - 7 x weekly - 3 sets - 10 reps - Lateral Step Up  - 1 x daily - 7 x weekly - 3 sets - 10 reps - Standing Terminal Knee Extension with Resistance  - 1 x daily - 7 x weekly - 3 sets - 10 reps - Standing Knee Flexion  - 1 x daily - 7 x weekly - 2 sets - 10 reps - Seated Heel Slide  - 1 x daily - 7 x weekly - 2 sets - 10 reps   ASSESSMENT:   CLINICAL IMPRESSION:   Pt arrives to session  15 min late and pain at 5/10 but expressing he enjoyed working out the last two sessions.  LEFS 56/80  70.0%.  See flow chart for improvement in MMT and AROM.  LT knee flexion AROM/PROM 112/120. Continued working on gross LLE strengthening utilizing equipment with increased reps/ sets to continue with strengthening and encourage endurance training.  Mr Loveland is making improvements in function and gait with slight antalgic gait.  5 x STS 13.30 sec improved from eval.  Will continue to make progress toward LTG.       OBJECTIVE IMPAIRMENTS: difficulty walking, decreased ROM, decreased strength, increased edema, and pain.    ACTIVITY  LIMITATIONS: standing, squatting, transfers, bathing, dressing, and hygiene/grooming   PARTICIPATION LIMITATIONS: community activity, occupation, and return to exercise and recreational golf /hiking   PERSONAL FACTORS: 1 comorbidity: BIL THA for AVN  are also affecting patient's functional outcome.    REHAB POTENTIAL: Excellent   CLINICAL DECISION MAKING: Evolving/moderate complexity   EVALUATION COMPLEXITY: Moderate     GOALS: Goals reviewed with patient? Yes   SHORT TERM GOALS: Target date: 07-23-22  Pt will be independent with initial HEP Baseline: no knowledge Status: 07/17/22 : independent Goal status: MET   2.  Pt will return to bed to sleep rather than sleeping in recliner Baseline: eval must sleep in recliner Status: 07/17/22: has slept in bed 2 times.  Goal status: ONGOING   3.  Pain will decrease to 4/10 at worst/standing bending knee Baseline: eval pain up to 8/10 Status: 07/17/22: 7/10  07-23-22  7/10 Goal status: ONGOING   4.  L knee AROM flexion will improve to 5-115 degrees for improved mobility to ride in car without increased pain Baseline: See AROM chart Status: 07/17/22: 5-106 degrees Status 07-23-22  3-110 degrees Goal status: PARTIALLY MET   5.  Pt will be able to stand with even wt distribution in bil LE Baseline: Pt wt bears to right Status: 07/17/22: continues to bear wt to RLE, needs cues 07-23-22  worked on wt bearing  activities. Goal status: partially met   6.  Pt with DF of L will be at 0 neutral             Baseline  -11 from neutral  Status: -20 from neutral   Status -17 from neutral            Goal Status: ONGOING     LONG TERM GOALS: Target date: 08/13/2022    Pt will be independent with advanced HEP Baseline: No knowledge Goal status: ONGOING   2.  Pt will be able to walk 2 miles for exercise Baseline: unable to stand for greater than 10 min Goal status: ONGOING   3.  Pt will be able to get back to recreational pursuits, hiking  , golf and kayaking Baseline: unable to participate due to knee surgery Goal status: ONGOING   4.  PT with be able to walk/stand >/= 1 hour with no AD with </= 2/10 pain for functional endurance and return to leisure activities  Baseline: Pt unable to stand for greater than 10 min with 6-8/10 pain Goal status: INITIAL   5.  Pt will be able to negotiate steps without any AD and with safe execution Baseline: unable at eval due to not having brace12-28-23  Pt negotiating steps daily with 5/10 pain at apartment Goal status: ONGOING   6.  Patient will report improved functional level on LEFS >/= 70/80 in order to allow for return to prior exercise level   Baseline: LEFS 21/80 26.3 % disability 08-15-22 70.0% Goal status: ONGOING     PLAN:   PT FREQUENCY: 2x/week   PT DURATION: 6 weeks   PLANNED INTERVENTIONS: Therapeutic exercises, Therapeutic activity, Neuromuscular re-education, Balance training, Gait training, Patient/Family education, Self Care, Joint mobilization, Stair training, DME instructions, Dry Needling, Electrical stimulation, Cryotherapy, Moist heat, scar mobilization, Taping, Manual therapy, and Re-evaluation   PLAN FOR NEXT SESSION: Review HEP,  knee ROM and strengthening; gait training. (Will need to resubmit after the 28th for the beginning of the year for more visits), reassess short term goals.    Voncille Lo, PT, [place] Certified Exercise Expert for the Aging Adult  08/15/22 1:01 PM Phone: [phone] Fax: [phone]

## 2022-08-13 NOTE — Therapy (Incomplete)
OUTPATIENT PHYSICAL THERAPY TREATMENT NOTE   Patient Name: Keith Alvarado MRN: [id number] DOB:09/12/1975, 46 y.o., male Today's Date: 08/13/2022  PCP: none REFERRING PROVIDER: Ainsley Spinner, PA-C    END OF SESSION:         Past Medical History:  Diagnosis Date   Medical history non-contributory    Vitamin D deficiency 05/17/2022   Past Surgical History:  Procedure Laterality Date   FOOT SURGERY Left    JOINT REPLACEMENT Right 2021   ORIF TIBIA PLATEAU Left 05/16/2022   Procedure: OPEN REDUCTION INTERNAL FIXATION (ORIF) TIBIAL PLATEAU;  Surgeon: Altamese , MD;  Location: [place];  Service: Orthopedics;  Laterality: Left;   TOTAL HIP ARTHROPLASTY Right 07/17/2020   Procedure: RIGHT TOTAL HIP ARTHROPLASTY ANTERIOR APPROACH;  Surgeon: Frederik Pear, MD;  Location: [facility];  Service: Orthopedics;  Laterality: Right;   TOTAL HIP ARTHROPLASTY Left 12/11/2020   Procedure: LEFT TOTAL HIP ARTHROPLASTY ANTERIOR APPROACH;  Surgeon: Frederik Pear, MD;  Location: [facility];  Service: Orthopedics;  Laterality: Left;   Patient Active Problem List   Diagnosis Date Noted   Vitamin D deficiency 05/17/2022   Closed fracture of lateral portion of left tibial plateau 05/16/2022   S/P hip replacement, left 12/11/2020   AVN (avascular necrosis of bone) ([place]) 12/06/2020   AVN of femur ([place]) 07/07/2020    REFERRING DIAG: Left tibial plateau fracture     THERAPY DIAG:  No diagnosis found.  Rationale for Evaluation and Treatment Rehabilitation  PERTINENT HISTORY: ORIF L tibial plateus fx and meniscus repair with Dr [name] 05-16-22 Bil THR due to AVN   PRECAUTIONS:  Yes NWB for 6 weeks from surgery  from Ainsley Spinner secure message. Brace for 2 weeks and then WBAT for 2 weeks in brace before weaning.  Wean from brace after November 28th.   SUBJECTIVE:                                                                                                                                                                                       SUBJECTIVE STATEMENT: " I am doing better today. The last session was just right, that I wasn't too sore but could tell I got some work."  PAIN:  Are you having pain? Yes: NPRS scale: (last took meds for pain this AM.6/10 Pain location: Lt knee Pain description: ache Aggravating factors: movement Relieving factors: medicine   OBJECTIVE: (objective measures completed at initial evaluation unless otherwise dated)  DIAGNOSTIC FINDINGS:  05-16-22 There is internal fixation of comminuted fracture in the medial tibial plateau with metallic plate and surgical screws. There is is surgical drain in the soft tissues along the lateral  aspect. There is possible minimal effusion in suprapatellar bursa.   IMPRESSION: Status post internal fixation of comminuted fracture of lateral plateau of left tibia.     PATIENT SURVEYS:  LEFS 21/80  26.3% disability   COGNITION: Overall cognitive status: Within functional limits for tasks assessed                         SENSATION: Numbness on top of ankle and down Left shin bone   EDEMA:  Circumferential: Left knee 43 cm  R 39 cm   MUSCLE LENGTH: Hamstrings: Right 74 deg; Left 60 deg     POSTURE: rounded shoulders, forward head, and tall and within normal BMI   PALPATION: TTP over medial knee down into shin and on top of ankle   LOWER EXTREMITY ROM:   Active ROM Right eval Left eval 07/10/22 Left 07/17/22 Left 07-23-22 Left  Hip flexion         Hip extension         Hip abduction         Hip adduction         Hip internal rotation         Hip external rotation         Knee flexion 135/P139 105/P109 104 106 active 110 Active  Knee extension 0/+4 hyperext 12deficit/P 10 deficit  -5 active -3 active  Ankle dorsiflexion 9 -11defict/stiff  -20 active supine -17 active supine  Ankle plantarflexion         Ankle inversion         Ankle eversion          (Blank rows = not tested)   LOWER EXTREMITY  MMT:   MMT Right eval Left eval  Hip flexion 5 5  Hip extension      Hip abduction 5    Hip adduction      Hip internal rotation      Hip external rotation      Knee flexion 5 3-  Knee extension 5 3-   Ankle dorsiflexion 5    Ankle plantarflexion      Ankle inversion      Ankle eversion       (Blank rows = not tested)     FUNCTIONAL TESTS:  5 times sit to stand: 17.34 sec and Wt bears to Right   GAIT: Distance walked: 250 ft  Assistive device utilized: Crutches Level of assistance: Complete Independence Comments: Pt able to bear wt in Left LE but wt bears to right.  Pt without brace as per MD order and did not attempt stairs at eval  TREAT MENT______________________________________________________________________ [facility] Adult PT Treatment:                                                DATE: 08-13-22 Therapeutic Exercise: *** Manual Therapy: *** Neuromuscular re-ed: *** Therapeutic Activity: *** Modalities: *** Self Care: ***  [facility] Adult PT Treatment:                                                DATE: 08/07/2022 Therapeutic Exercise: Recumbent bike L5 x 5 min - seat lowered at 2:30 sec to promote knee flexion  Slant board calf stretch 2 x 30  Leg press (cybex)1 x 15 20#,  2 x 10 20# LLE only Between leg press sets performed 2 x 20 heel raised 20# Seated knee extension 2 x 10 with 10# , 1 set bil LE, 2nd set con bil/ ecc L only Seated hamstring curl 2 x 10 with 25# 1 set bil LE, 2nd set con bil/ ecc L only   [facility] Adult PT Treatment:                                                DATE: 08/05/2022 Therapeutic Exercise: Ankle pumps/ ABC in prone with knee flexed x 2 Prone hamstring curl 2 x 12 Prone glute set 1 x 10 holindg 5 sec  Sidelying hip abduction 2 x 15 LLE only Bridge 2 x 10 SLR LLE 2 x 10 Seated heel/ toe raise 2 x 10 LLE using rocker board Seated rocker board inversion/ eversion 2 x 20  Recumbent bike L 3 x 5 min  Manual Therapy: L ankle edema  reduction massage    [facility] Adult PT Treatment:                                                DATE: 07/29/22 Therapeutic Exercise: Supine calf stretch 2 x 30 sec with strap  Recumbent bike L 2 x 5 min (seat lowered at 2:30 to promote knee flexion) Mini squat 2 x 10 from freemotion for stability  Standing calf stretch 2 x 30 sec on slant board (after bike) Standing hamstring curl 2 x 12 1 set unweighted, 2nd set with 3#  Seated bil LE ankle DF/ PF using rockerboard  to help facilitate ROM Seated heel slide 1 x 20 with cues to keep heel down Manual Therapy: MTPR along the L gastroc Talocrural distraction grade III, talocrural AP mobs grade IV  Therapeutic Activity: Gait training 4 x 30 ft verbal cues to focus on heel strike    PATIENT EDUCATION:  Education details: continue HEP Person educated: Patient Education method: Explanation Education comprehension: verbalized understanding   HOME EXERCISE PROGRAM: Access Code: [email or web address] URL: [email or web address] Date: 07/31/2022 Prepared by: Starr Lake  Exercises - Supine active Straight Leg Raise in Brace until July 16, 2022  - 1 x daily - 7 x weekly - 3 sets - 10 reps - Long Sitting Quad Set with Towel Roll Under Heel  - 1 x daily - 7 x weekly - 3 sets - 10 reps - Supine Heel Slide  - 1 x daily - 7 x weekly - 3 sets - 10 reps - Seated Calf Stretch with Strap  - 1 x daily - 7 x weekly - 3 sets - 3 reps - 30-60-sec hold - Standing Gastroc Stretch  - 1 x daily - 7 x weekly - 1 sets - 10 reps - 5-10 hold - Mini Squat with Counter Support  - 1 x daily - 7 x weekly - 2 sets - 10 reps - Sidelying Hip Abduction  - 1 x daily - 7 x weekly - 2 sets - 10 reps - Step Up  - 1 x daily - 7 x weekly - 3 sets - 10 reps - Lateral Step Up  -  1 x daily - 7 x weekly - 3 sets - 10 reps - Standing Terminal Knee Extension with Resistance  - 1 x daily - 7 x weekly - 3 sets - 10 reps - Standing Knee Flexion  - 1 x daily - 7 x weekly - 2  sets - 10 reps - Seated Heel Slide  - 1 x daily - 7 x weekly - 2 sets - 10 reps   ASSESSMENT:   CLINICAL IMPRESSION: Pt arrives to session noting he is doing better today than the last session. Continued working on gross LLE strengthening utilizing equipment with increased reps/ sets to continue with strengthening and encourage endurance training. Overall he did very well but does demonstrate some apprehension with starting new exercises which eases after a few sets. He continues to demonstrate an antalgic gait pattern that he is able to reduce with verbal cues for heel strike/ toe off and taking smaller steps. End of session he noted no increase in pain.       OBJECTIVE IMPAIRMENTS: difficulty walking, decreased ROM, decreased strength, increased edema, and pain.    ACTIVITY LIMITATIONS: standing, squatting, transfers, bathing, dressing, and hygiene/grooming   PARTICIPATION LIMITATIONS: community activity, occupation, and return to exercise and recreational golf /hiking   PERSONAL FACTORS: 1 comorbidity: BIL THA for AVN  are also affecting patient's functional outcome.    REHAB POTENTIAL: Excellent   CLINICAL DECISION MAKING: Evolving/moderate complexity   EVALUATION COMPLEXITY: Moderate     GOALS: Goals reviewed with patient? Yes   SHORT TERM GOALS: Target date: 07-23-22  Pt will be independent with initial HEP Baseline: no knowledge Status: 07/17/22 : independent Goal status: MET   2.  Pt will return to bed to sleep rather than sleeping in recliner Baseline: eval must sleep in recliner Status: 07/17/22: has slept in bed 2 times.  Goal status: ONGOING   3.  Pain will decrease to 4/10 at worst/standing bending knee Baseline: eval pain up to 8/10 Status: 07/17/22: 7/10  07-23-22  7/10 Goal status: ONGOING   4.  L knee AROM flexion will improve to 5-115 degrees for improved mobility to ride in car without increased pain Baseline: See AROM chart Status: 07/17/22: 5-106  degrees Status 07-23-22  3-110 degrees Goal status: PARTIALLY MET   5.  Pt will be able to stand with even wt distribution in bil LE Baseline: Pt wt bears to right Status: 07/17/22: continues to bear wt to RLE, needs cues 07-23-22  worked on wt bearing activities. Goal status: partially met   6.  Pt with DF of L will be at 0 neutral             Baseline  -11 from neutral  Status: -20 from neutral   Status -17 from neutral            Goal Status: ONGOING     LONG TERM GOALS: Target date: 08/13/2022    Pt will be independent with advanced HEP Baseline: No knowledge Goal status: INITIAL   2.  Pt will be able to walk 2 miles for exercise Baseline: unable to stand for greater than 10 min Goal status: ONGOING   3.  Pt will be able to get back to recreational pursuits, hiking , golf and kayaking Baseline: unable to participate due to knee surgery Goal status: ONGOING   4.  PT with be able to walk/stand >/= 1 hour with no AD with </= 2/10 pain for functional endurance and return to leisure activities  Baseline: Pt unable to stand for greater than 10 min with 6-8/10 pain Goal status: INITIAL   5.  Pt will be able to negotiate steps without any AD and with safe execution Baseline: unable at eval due to not having brace Goal status: ONGOING   6.  Patient will report improved functional level on LEFS >/= 70/80 in order to allow for return to prior exercise level   Baseline: LEFS 21/80 26.3 % disability Goal status: ONGOING     PLAN:   PT FREQUENCY: 2x/week   PT DURATION: 6 weeks   PLANNED INTERVENTIONS: Therapeutic exercises, Therapeutic activity, Neuromuscular re-education, Balance training, Gait training, Patient/Family education, Self Care, Joint mobilization, Stair training, DME instructions, Dry Needling, Electrical stimulation, Cryotherapy, Moist heat, scar mobilization, Taping, Manual therapy, and Re-evaluation   PLAN FOR NEXT SESSION: Review HEP,  knee ROM and  strengthening; gait training. (Will need to resubmit after the 28th for the beginning of the year for more visits), reassess short term goals.    ***

## 2022-08-15 ENCOUNTER — Ambulatory Visit: Payer: Medicaid Other | Admitting: Physical Therapy

## 2022-08-15 ENCOUNTER — Other Ambulatory Visit: Payer: Self-pay | Admitting: Orthopedic Surgery

## 2022-08-15 DIAGNOSIS — S83282D Other tear of lateral meniscus, current injury, left knee, subsequent encounter: Secondary | ICD-10-CM

## 2022-08-15 DIAGNOSIS — M25652 Stiffness of left hip, not elsewhere classified: Secondary | ICD-10-CM

## 2022-08-15 DIAGNOSIS — R262 Difficulty in walking, not elsewhere classified: Secondary | ICD-10-CM

## 2022-08-15 DIAGNOSIS — M25562 Pain in left knee: Secondary | ICD-10-CM

## 2022-08-15 DIAGNOSIS — R2689 Other abnormalities of gait and mobility: Secondary | ICD-10-CM

## 2022-08-15 DIAGNOSIS — M6281 Muscle weakness (generalized): Secondary | ICD-10-CM

## 2022-08-15 DIAGNOSIS — M25552 Pain in left hip: Secondary | ICD-10-CM

## 2022-08-15 DIAGNOSIS — Z96641 Presence of right artificial hip joint: Secondary | ICD-10-CM

## 2022-08-15 DIAGNOSIS — M25651 Stiffness of right hip, not elsewhere classified: Secondary | ICD-10-CM

## 2022-08-15 DIAGNOSIS — M25551 Pain in right hip: Secondary | ICD-10-CM

## 2022-08-20 ENCOUNTER — Encounter: Payer: Self-pay | Admitting: Physical Therapy

## 2022-08-20 ENCOUNTER — Ambulatory Visit: Payer: Medicaid Other | Attending: Orthopedic Surgery | Admitting: Physical Therapy

## 2022-08-20 DIAGNOSIS — M25651 Stiffness of right hip, not elsewhere classified: Secondary | ICD-10-CM | POA: Insufficient documentation

## 2022-08-20 DIAGNOSIS — M25552 Pain in left hip: Secondary | ICD-10-CM | POA: Diagnosis present

## 2022-08-20 DIAGNOSIS — M25652 Stiffness of left hip, not elsewhere classified: Secondary | ICD-10-CM | POA: Diagnosis present

## 2022-08-20 DIAGNOSIS — Z96641 Presence of right artificial hip joint: Secondary | ICD-10-CM | POA: Insufficient documentation

## 2022-08-20 DIAGNOSIS — M25551 Pain in right hip: Secondary | ICD-10-CM | POA: Diagnosis present

## 2022-08-20 DIAGNOSIS — R2689 Other abnormalities of gait and mobility: Secondary | ICD-10-CM | POA: Diagnosis present

## 2022-08-20 DIAGNOSIS — M6281 Muscle weakness (generalized): Secondary | ICD-10-CM | POA: Diagnosis present

## 2022-08-20 DIAGNOSIS — M25562 Pain in left knee: Secondary | ICD-10-CM | POA: Diagnosis present

## 2022-08-20 DIAGNOSIS — R262 Difficulty in walking, not elsewhere classified: Secondary | ICD-10-CM | POA: Insufficient documentation

## 2022-08-20 NOTE — Therapy (Signed)
OUTPATIENT PHYSICAL THERAPY TREATMENT NOTE / RE-CERTIFICATION   Patient Name: Keith Alvarado MRN: [id number] DOB:July 02, 1976, 47 y.o., male Today's Date: 08/20/2022  PCP: none REFERRING PROVIDER: Ainsley Spinner, PA-C    END OF SESSION:   PT End of Session - 08/20/22 1106     Visit Number 10    Number of Visits 20    Date for PT Re-Evaluation 10/15/22    Authorization Type [employer] access    Authorization Time Period 12/4/ - 08/18/2022    PT Start Time 1103    PT Stop Time 1142    PT Time Calculation (min) 39 min                  Past Medical History:  Diagnosis Date   Medical history non-contributory    Vitamin D deficiency 05/17/2022   Past Surgical History:  Procedure Laterality Date   FOOT SURGERY Left    JOINT REPLACEMENT Right 2021   ORIF TIBIA PLATEAU Left 05/16/2022   Procedure: OPEN REDUCTION INTERNAL FIXATION (ORIF) TIBIAL PLATEAU;  Surgeon: Altamese Norman, MD;  Location: [place];  Service: Orthopedics;  Laterality: Left;   TOTAL HIP ARTHROPLASTY Right 07/17/2020   Procedure: RIGHT TOTAL HIP ARTHROPLASTY ANTERIOR APPROACH;  Surgeon: Frederik Pear, MD;  Location: [facility];  Service: Orthopedics;  Laterality: Right;   TOTAL HIP ARTHROPLASTY Left 12/11/2020   Procedure: LEFT TOTAL HIP ARTHROPLASTY ANTERIOR APPROACH;  Surgeon: Frederik Pear, MD;  Location: [facility];  Service: Orthopedics;  Laterality: Left;   Patient Active Problem List   Diagnosis Date Noted   Vitamin D deficiency 05/17/2022   Closed fracture of lateral portion of left tibial plateau 05/16/2022   S/P hip replacement, left 12/11/2020   AVN (avascular necrosis of bone) ([place]) 12/06/2020   AVN of femur ([place]) 07/07/2020    REFERRING DIAG: Left tibial plateau fracture     THERAPY DIAG:  Difficulty in walking, not elsewhere classified  Acute pain of left knee  Stiffness of left hip, not elsewhere classified  Rationale for Evaluation and Treatment Rehabilitation  PERTINENT HISTORY: ORIF L  tibial plateus fx and meniscus repair with Dr [name] 05-16-22 Bil THR due to AVN   PRECAUTIONS:  Yes NWB for 6 weeks from surgery  from Ainsley Spinner secure message. Brace for 2 weeks and then WBAT for 2 weeks in brace before weaning.  Wean from brace after November 28th.   SUBJECTIVE:                                                                                                                                                                                      SUBJECTIVE STATEMENT:  More  sore and swollen today from walking a lot this weekend.  I am a 5/10. My ankle swelling  went down but the knee still swells.    PAIN:  Are you having pain? Yes: NPRS scale: 5/10 Pain location: Lt knee Pain description: ache Aggravating factors: movement Relieving factors: medicine   OBJECTIVE: (objective measures completed at initial evaluation unless otherwise dated)  DIAGNOSTIC FINDINGS:  05-16-22 There is internal fixation of comminuted fracture in the medial tibial plateau with metallic plate and surgical screws. There is is surgical drain in the soft tissues along the lateral aspect. There is possible minimal effusion in suprapatellar bursa.   IMPRESSION: Status post internal fixation of comminuted fracture of lateral plateau of left tibia.     PATIENT SURVEYS:  LEFS 21/80  26.3% disability   08-15-22  LEFS 56/80  70%  COGNITION: Overall cognitive status: Within functional limits for tasks assessed                         SENSATION: Numbness on top of ankle and down Left shin bone   EDEMA:  Circumferential: Left knee 43 cm  R 39 cm   MUSCLE LENGTH: Hamstrings: Right 74 deg; Left 60 deg     POSTURE: rounded shoulders, forward head, and tall and within normal BMI   PALPATION: TTP over medial knee down into shin and on top of ankle   LOWER EXTREMITY ROM:   Active ROM Right eval Left eval 07/10/22 Left 07/17/22 Left 07-23-22 Left 08-15-22 Left 08/20/22 Left   Hip flexion            Hip extension           Hip abduction           Hip adduction           Hip internal rotation           Hip external rotation           Knee flexion 135/P139 105/P109 104 106 active 110 Active 112/PROM 120 110  Knee extension 0/+4 hyperext 12deficit/P 10 deficit  -5 active -3 active -3 Active -3  Ankle dorsiflexion 9 -11defict/stiff  -20 active supine -17 active supine    Ankle plantarflexion           Ankle inversion           Ankle eversion            (Blank rows = not tested)   LOWER EXTREMITY MMT:   MMT Right eval Left eval RT/LT 08-15-22 LT 08/20/22  Hip flexion 5 5 5/5   Hip extension        Hip abduction 5   5/4 4  Hip adduction        Hip internal rotation        Hip external rotation        Knee flexion 5 3- 5/4 4  Knee extension 5 3-  5/4 4  Ankle dorsiflexion 5   5/4+   Ankle plantarflexion        Ankle inversion        Ankle eversion         (Blank rows = not tested)     FUNCTIONAL TESTS:  5 times sit to stand: 17.34 sec and Wt bears to Right  5 x STS  13.30 sec 08/20/22: 5 x STS 13.3 sec    GAIT: Distance walked: 250 ft  Assistive device utilized: Crutches  Level of assistance: Complete Independence Comments: Pt able to bear wt in Left LE but wt bears to right.  Pt without brace as per MD order and did not attempt stairs at eval  TREAT MENT______________________________________________________________________ [facility] Adult PT Treatment:                                                DATE: 08/20/22 Therapeutic Exercise: Rec Bike L3 x 6 minutes  Standing heel raises 10 x 2  Left gastroc stretch x 3 for 30 sec  Horizontal Leg Press :40# bil  Leg press 20# single LE  x 10 Knee ext OMEGA 10# bil x 20  Knee ext OMEGA 5# bil to Left eccentric - 1/2 ROM  Left LAQ 5 # x 15  Seated OMEGA Knee flexion 35# blat with eccentric focus  QS into towel  SLR with initial QS.  STS x 10- cues for equal weight bearing      [facility] Adult PT Treatment:                                                 DATE: 08-15-22  Therapeutic Exercise: Recumbent bike L5 x 5 min - seat lowered at 2:30 sec to promote knee flexion Slant board calf stretch 2 x 30  Leg press (cybex)1 x 15 25 #,  2 x 10 35# LLE only Between leg press sets performed 2 x 20 heel raised 25# Seated knee extension 3 x 10 with 15#  with BIL concentric and eccentric lowering LT Seated hamstring curl 2 x 10 with 35# 1 set bil LE, 2nd set con bil/ ecc L only   [facility] Adult PT Treatment:                                                DATE: 08/07/2022 Therapeutic Exercise: Recumbent bike L5 x 5 min - seat lowered at 2:30 sec to promote knee flexion Slant board calf stretch 2 x 30  Leg press (cybex)1 x 12 25#,  2 x 12 35# LLE only Between leg press sets performed 2 x 20 heel raised 20# Seated knee extension 2 x 10 with 1# , 1 set bil LE, 2nd set con bil/ ecc L only Seated hamstring curl 2 x 10 with 25# 1 set bil LE, 2nd set con bil/ ecc L only   [facility] Adult PT Treatment:                                                DATE: 08/05/2022 Therapeutic Exercise: Ankle pumps/ ABC in prone with knee flexed x 2 Prone hamstring curl 2 x 12 Prone glute set 1 x 10 holindg 5 sec  Sidelying hip abduction 2 x 15 LLE only Bridge 2 x 10 SLR LLE 2 x 10 Seated heel/ toe raise 2 x 10 LLE using rocker board Seated rocker board inversion/ eversion 2 x 20  Recumbent bike L 3 x 5 min  Manual Therapy: L ankle edema reduction massage    [facility] Adult PT Treatment:                                                DATE: 07/29/22 Therapeutic Exercise: Supine calf stretch 2 x 30 sec with strap  Recumbent bike L 2 x 5 min (seat lowered at 2:30 to promote knee flexion) Mini squat 2 x 10 from freemotion for stability  Standing calf stretch 2 x 30 sec on slant board (after bike) Standing hamstring curl 2 x 12 1 set unweighted, 2nd set with 3#  Seated bil LE ankle DF/ PF using rockerboard  to help facilitate ROM Seated heel slide  1 x 20 with cues to keep heel down Manual Therapy: MTPR along the L gastroc Talocrural distraction grade III, talocrural AP mobs grade IV  Therapeutic Activity: Gait training 4 x 30 ft verbal cues to focus on heel strike    PATIENT EDUCATION:  Education details: continue HEP Person educated: Patient Education method: Explanation Education comprehension: verbalized understanding   HOME EXERCISE PROGRAM: Access Code: [email or web address] URL: [email or web address] Date: 07/31/2022 Prepared by: Starr Lake  Exercises - Supine active Straight Leg Raise in Brace until July 16, 2022  - 1 x daily - 7 x weekly - 3 sets - 10 reps - Long Sitting Quad Set with Towel Roll Under Heel  - 1 x daily - 7 x weekly - 3 sets - 10 reps - Supine Heel Slide  - 1 x daily - 7 x weekly - 3 sets - 10 reps - Seated Calf Stretch with Strap  - 1 x daily - 7 x weekly - 3 sets - 3 reps - 30-60-sec hold - Standing Gastroc Stretch  - 1 x daily - 7 x weekly - 1 sets - 10 reps - 5-10 hold - Mini Squat with Counter Support  - 1 x daily - 7 x weekly - 2 sets - 10 reps - Sidelying Hip Abduction  - 1 x daily - 7 x weekly - 2 sets - 10 reps - Step Up  - 1 x daily - 7 x weekly - 3 sets - 10 reps - Lateral Step Up  - 1 x daily - 7 x weekly - 3 sets - 10 reps - Standing Terminal Knee Extension with Resistance  - 1 x daily - 7 x weekly - 3 sets - 10 reps - Standing Knee Flexion  - 1 x daily - 7 x weekly - 2 sets - 10 reps - Seated Heel Slide  - 1 x daily - 7 x weekly - 2 sets - 10 reps   ASSESSMENT:   CLINICAL IMPRESSION: Pt arrives with antalgic gait, no AD and reports pain at 5/10 in left knee. He reports reduced ankle edema. He reports limitation of 30 minutes of activity on feet with increased pain to 6-7/10. He has made improvements in knee ROM and LE strength. He has partially met several Short term and long term goals ( see gaol section) . He continues to demonstrate decreased weight bearing to LLE in  standing and with transfers. He is able to safely negotiate steps without AD and with compensations due to decreased left knee strength and ROM. Mr Shankman would benefit from continued physical therapy to addressing gait abnormalities/ compensation, improve ankle/ knee mobility and strength and maximize  endurance and overall function by addressing the deficits listed.         OBJECTIVE IMPAIRMENTS: difficulty walking, decreased ROM, decreased strength, increased edema, and pain.    ACTIVITY LIMITATIONS: standing, squatting, transfers, bathing, dressing, and hygiene/grooming   PARTICIPATION LIMITATIONS: community activity, occupation, and return to exercise and recreational golf /hiking   PERSONAL FACTORS: 1 comorbidity: BIL THA for AVN  are also affecting patient's functional outcome.    REHAB POTENTIAL: Excellent   CLINICAL DECISION MAKING: Evolving/moderate complexity   EVALUATION COMPLEXITY: Moderate     GOALS: Goals reviewed with patient? Yes   SHORT TERM GOALS: Target date: 07-23-22  Pt will be independent with initial HEP Baseline: no knowledge Status: 07/17/22 : independent Goal status: MET   2.  Pt will return to bed to sleep rather than sleeping in recliner Baseline: eval must sleep in recliner Status: 07/17/22: has slept in bed 2 times.  08/20/22: sleeping in bed Goal status: MET   3.  Pain will decrease to 4/10 at worst/standing bending knee Baseline: eval pain up to 8/10 Status: 07/17/22: 7/10  07-23-22  7/10 08/20/22: 6-7/10 with ROM exercises and end of day Goal status: ONGOING   4.  L knee AROM flexion will improve to 5-115 degrees for improved mobility to ride in car without increased pain Baseline: See AROM chart Status: 07/17/22: 5-106 degrees Status 08/20/22  3-110 degrees Goal status: PARTIALLY MET   5.  Pt will be able to stand with even wt distribution in bil LE Baseline: Pt wt bears to right Status: 07/17/22: continues to bear wt to RLE, needs cues   1/2/-24 needs cues Goal status: partially met   6.  Pt with DF of L will be at 0 neutral             Baseline  -11 from neutral  Status: -20 from neutral   Status -17 from neutral 07/23/22            Goal Status: ONGOING     LONG TERM GOALS: Target date: 10/01/2022     Pt will be independent with advanced HEP Baseline: No knowledge Goal status: ONGOING   2.  Pt will be able to walk 2 miles for exercise Baseline: unable to stand for greater than 10 min 08/20/22: can walk 30 minutes  Goal status: ONGOING   3.  Pt will be able to get back to recreational pursuits, hiking , golf and kayaking Baseline: unable to participate due to knee surgery 08/20/22: unable to participate , working on walking Goal status: ONGOING   4.  PT with be able to walk/stand >/= 1 hour with no AD with </= 2/10 pain for functional endurance and return to leisure activities  Baseline: Pt unable to stand for greater than 10 min with 6-8/10 pain 08/20/22: can walk for 30 minutes, has increased pain end of day to 6-7/10 Goal status: ONGOING   5.  Pt will be able to negotiate steps without any AD and with safe execution Baseline: unable at eval due to not having brace12-28-23  Pt negotiating steps daily with 5/10 pain at apartment 08/20/22: able to negotiate steps independently  with HR and compensations Goal status: PARTIALLY MET   6.  Patient will report improved functional level on LEFS >/= 70/80 in order to allow for return to prior exercise level Baseline: LEFS 21/80 26.3 % disability  08-15-22 70.0% Goal status: MET     PLAN:   PT FREQUENCY: 2x/week   PT DURATION:  10/15/2022   PLANNED INTERVENTIONS: Therapeutic exercises, Therapeutic activity, Neuromuscular re-education, Balance training, Gait training, Patient/Family education, Self Care, Joint mobilization, Stair training, DME instructions, Dry Needling, Electrical stimulation, Cryotherapy, Moist heat, scar mobilization, Taping, Manual therapy, and  Re-evaluation   PLAN FOR NEXT SESSION: Review HEP,  knee ROM and strengthening; gait training.     Hessie Diener, PTA 08/20/22 1:13 PM Phone: [phone] Fax: [phone]     Starr Lake PT, DPT, LAT, ATC  08/20/22  1:18 PM

## 2022-08-22 ENCOUNTER — Ambulatory Visit: Payer: Medicaid Other | Admitting: Physical Therapy

## 2022-08-26 ENCOUNTER — Ambulatory Visit: Payer: Medicaid Other | Admitting: Physical Therapy

## 2022-08-26 NOTE — Therapy (Incomplete)
OUTPATIENT PHYSICAL THERAPY TREATMENT NOTE   Patient Name: Keith Alvarado MRN: [id number] DOB:1975/12/15, 47 y.o., male Today's Date: 08/26/2022  PCP: none REFERRING PROVIDER: Montez Morita, PA-C    END OF SESSION:          Past Medical History:  Diagnosis Date   Medical history non-contributory    Vitamin D deficiency 05/17/2022   Past Surgical History:  Procedure Laterality Date   FOOT SURGERY Left    JOINT REPLACEMENT Right 2021   ORIF TIBIA PLATEAU Left 05/16/2022   Procedure: OPEN REDUCTION INTERNAL FIXATION (ORIF) TIBIAL PLATEAU;  Surgeon: Myrene Galas, MD;  Location: [facility];  Service: Orthopedics;  Laterality: Left;   TOTAL HIP ARTHROPLASTY Right 07/17/2020   Procedure: RIGHT TOTAL HIP ARTHROPLASTY ANTERIOR APPROACH;  Surgeon: Gean Birchwood, MD;  Location: [facility];  Service: Orthopedics;  Laterality: Right;   TOTAL HIP ARTHROPLASTY Left 12/11/2020   Procedure: LEFT TOTAL HIP ARTHROPLASTY ANTERIOR APPROACH;  Surgeon: Gean Birchwood, MD;  Location: [facility];  Service: Orthopedics;  Laterality: Left;   Patient Active Problem List   Diagnosis Date Noted   Vitamin D deficiency 05/17/2022   Closed fracture of lateral portion of left tibial plateau 05/16/2022   S/P hip replacement, left 12/11/2020   AVN (avascular necrosis of bone) (HCC) 12/06/2020   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  Rationale for Evaluation and Treatment Rehabilitation  PERTINENT HISTORY: ORIF L tibial plateus fx and meniscus repair with Dr Carola Frost 05-16-22 Bil THR due to AVN   PRECAUTIONS:  Yes NWB for 6 weeks from surgery  from Montez Morita secure message. Brace for 2 weeks and then WBAT for 2 weeks in brace before weaning.  Wean from brace after November 28th.   SUBJECTIVE:                                                                                                                                                                                       SUBJECTIVE STATEMENT:  ***   PAIN:  Are you having pain? Yes: NPRS scale: 5/10 Pain location: Lt knee Pain description: ache Aggravating factors: movement Relieving factors: medicine   OBJECTIVE: (objective measures completed at initial evaluation unless otherwise dated)  DIAGNOSTIC FINDINGS:  05-16-22 There is internal fixation of comminuted fracture in the medial tibial plateau with metallic plate and surgical screws. There is is surgical drain in the soft tissues along the lateral aspect. There is possible minimal effusion in suprapatellar bursa.   IMPRESSION: Status post internal fixation of comminuted fracture of lateral plateau of left tibia.  PATIENT SURVEYS:  LEFS 21/80  26.3% disability   08-15-22  LEFS 56/80  70%  COGNITION: Overall cognitive status: Within functional limits for tasks assessed                         SENSATION: Numbness on top of ankle and down Left shin bone   EDEMA:  Circumferential: Left knee 43 cm  R 39 cm   MUSCLE LENGTH: Hamstrings: Right 74 deg; Left 60 deg     POSTURE: rounded shoulders, forward head, and tall and within normal BMI   PALPATION: TTP over medial knee down into shin and on top of ankle   LOWER EXTREMITY ROM:   Active ROM Right eval Left eval 07/10/22 Left 07/17/22 Left 07-23-22 Left 08-15-22 Left 08/20/22 Left   Hip flexion           Hip extension           Hip abduction           Hip adduction           Hip internal rotation           Hip external rotation           Knee flexion 135/P139 105/P109 104 106 active 110 Active 112/PROM 120 110  Knee extension 0/+4 hyperext 12deficit/P 10 deficit  -5 active -3 active -3 Active -3  Ankle dorsiflexion 9 -11defict/stiff  -20 active supine -17 active supine    Ankle plantarflexion           Ankle inversion           Ankle eversion            (Blank rows = not tested)   LOWER EXTREMITY MMT:   MMT Right eval  Left eval RT/LT 08-15-22 LT 08/20/22  Hip flexion 5 5 5/5   Hip extension        Hip abduction 5   5/4 4  Hip adduction        Hip internal rotation        Hip external rotation        Knee flexion 5 3- 5/4 4  Knee extension 5 3-  5/4 4  Ankle dorsiflexion 5   5/4+   Ankle plantarflexion        Ankle inversion        Ankle eversion         (Blank rows = not tested)     FUNCTIONAL TESTS:  5 times sit to stand: 17.34 sec and Wt bears to Right  5 x STS  13.30 sec 08/20/22: 5 x STS 13.3 sec    GAIT: Distance walked: 250 ft  Assistive device utilized: Crutches Level of assistance: Complete Independence Comments: Pt able to bear wt in Left LE but wt bears to right.  Pt without brace as per MD order and did not attempt stairs at eval  TODAY'S TREATMENT  [facility] Adult PT Treatment:                                                DATE: 08/26/2022 Therapeutic Exercise: *** Manual Therapy: *** Neuromuscular re-ed: *** Therapeutic Activity: *** Modalities: *** Self Care: [facility] Adult PT Treatment:  DATE: 08/20/22 Therapeutic Exercise: Rec Bike L3 x 6 minutes  Standing heel raises 10 x 2  Left gastroc stretch x 3 for 30 sec  Horizontal Leg Press :40# bil  Leg press 20# single LE  x 10 Knee ext OMEGA 10# bil x 20  Knee ext OMEGA 5# bil to Left eccentric - 1/2 ROM  Left LAQ 5 # x 15  Seated OMEGA Knee flexion 35# blat with eccentric focus  QS into towel  SLR with initial QS.  STS x 10- cues for equal weight bearing    [facility] Adult PT Treatment:                                                DATE: 08-15-22  Therapeutic Exercise: Recumbent bike L5 x 5 min - seat lowered at 2:30 sec to promote knee flexion Slant board calf stretch 2 x 30  Leg press (cybex)1 x 15 25 #,  2 x 10 35# LLE only Between leg press sets performed 2 x 20 heel raised 25# Seated knee extension 3 x 10 with 15#  with BIL concentric and eccentric lowering  LT Seated hamstring curl 2 x 10 with 35# 1 set bil LE, 2nd set con bil/ ecc L only     PATIENT EDUCATION:  Education details: continue HEP Person educated: Patient Education method: Explanation Education comprehension: verbalized understanding   HOME EXERCISE PROGRAM: Access Code: [email or web address] URL: [email or web address] Date: 07/31/2022 Prepared by: Lulu Riding  Exercises - Supine active Straight Leg Raise in Brace until July 16, 2022  - 1 x daily - 7 x weekly - 3 sets - 10 reps - Long Sitting Quad Set with Towel Roll Under Heel  - 1 x daily - 7 x weekly - 3 sets - 10 reps - Supine Heel Slide  - 1 x daily - 7 x weekly - 3 sets - 10 reps - Seated Calf Stretch with Strap  - 1 x daily - 7 x weekly - 3 sets - 3 reps - 30-60-sec hold - Standing Gastroc Stretch  - 1 x daily - 7 x weekly - 1 sets - 10 reps - 5-10 hold - Mini Squat with Counter Support  - 1 x daily - 7 x weekly - 2 sets - 10 reps - Sidelying Hip Abduction  - 1 x daily - 7 x weekly - 2 sets - 10 reps - Step Up  - 1 x daily - 7 x weekly - 3 sets - 10 reps - Lateral Step Up  - 1 x daily - 7 x weekly - 3 sets - 10 reps - Standing Terminal Knee Extension with Resistance  - 1 x daily - 7 x weekly - 3 sets - 10 reps - Standing Knee Flexion  - 1 x daily - 7 x weekly - 2 sets - 10 reps - Seated Heel Slide  - 1 x daily - 7 x weekly - 2 sets - 10 reps   ASSESSMENT:   CLINICAL IMPRESSION: ***    OBJECTIVE IMPAIRMENTS: difficulty walking, decreased ROM, decreased strength, increased edema, and pain.    ACTIVITY LIMITATIONS: standing, squatting, transfers, bathing, dressing, and hygiene/grooming   PARTICIPATION LIMITATIONS: community activity, occupation, and return to exercise and recreational golf /hiking   PERSONAL FACTORS: 1 comorbidity: BIL THA for AVN  are also affecting patient's functional outcome.  REHAB POTENTIAL: Excellent   CLINICAL DECISION MAKING: Evolving/moderate complexity    EVALUATION COMPLEXITY: Moderate     GOALS: Goals reviewed with patient? Yes   SHORT TERM GOALS: Target date: 07-23-22  Pt will be independent with initial HEP Baseline: no knowledge Status: 07/17/22 : independent Goal status: MET   2.  Pt will return to bed to sleep rather than sleeping in recliner Baseline: eval must sleep in recliner Status: 07/17/22: has slept in bed 2 times.  08/20/22: sleeping in bed Goal status: MET   3.  Pain will decrease to 4/10 at worst/standing bending knee Baseline: eval pain up to 8/10 Status: 07/17/22: 7/10  07-23-22  7/10 08/20/22: 6-7/10 with ROM exercises and end of day Goal status: ONGOING   4.  L knee AROM flexion will improve to 5-115 degrees for improved mobility to ride in car without increased pain Baseline: See AROM chart Status: 07/17/22: 5-106 degrees Status 08/20/22  3-110 degrees Goal status: PARTIALLY MET   5.  Pt will be able to stand with even wt distribution in bil LE Baseline: Pt wt bears to right Status: 07/17/22: continues to bear wt to RLE, needs cues  1/2/-24 needs cues Goal status: partially met   6.  Pt with DF of L will be at 0 neutral             Baseline  -11 from neutral  Status: -20 from neutral   Status -17 from neutral 07/23/22            Goal Status: ONGOING     LONG TERM GOALS: Target date: 10/01/2022     Pt will be independent with advanced HEP Baseline: No knowledge Goal status: ONGOING   2.  Pt will be able to walk 2 miles for exercise Baseline: unable to stand for greater than 10 min 08/20/22: can walk 30 minutes  Goal status: ONGOING   3.  Pt will be able to get back to recreational pursuits, hiking , golf and kayaking Baseline: unable to participate due to knee surgery 08/20/22: unable to participate , working on walking Goal status: ONGOING   4.  PT with be able to walk/stand >/= 1 hour with no AD with </= 2/10 pain for functional endurance and return to leisure activities  Baseline: Pt unable to  stand for greater than 10 min with 6-8/10 pain 08/20/22: can walk for 30 minutes, has increased pain end of day to 6-7/10 Goal status: ONGOING   5.  Pt will be able to negotiate steps without any AD and with safe execution Baseline: unable at eval due to not having brace12-28-23  Pt negotiating steps daily with 5/10 pain at apartment 08/20/22: able to negotiate steps independently  with HR and compensations Goal status: PARTIALLY MET   6.  Patient will report improved functional level on LEFS >/= 70/80 in order to allow for return to prior exercise level Baseline: LEFS 21/80 26.3 % disability  08-15-22 70.0% Goal status: MET     PLAN:   PT FREQUENCY: 2x/week   PT DURATION: 10/15/2022   PLANNED INTERVENTIONS: Therapeutic exercises, Therapeutic activity, Neuromuscular re-education, Balance training, Gait training, Patient/Family education, Self Care, Joint mobilization, Stair training, DME instructions, Dry Needling, Electrical stimulation, Cryotherapy, Moist heat, scar mobilization, Taping, Manual therapy, and Re-evaluation   PLAN FOR NEXT SESSION: Review HEP,  knee ROM and strengthening; gait training.     Bonny Egger PT, DPT, LAT, ATC  08/26/22  8:58 AM

## 2022-08-27 ENCOUNTER — Ambulatory Visit: Payer: Medicaid Other | Admitting: Physical Therapy

## 2022-08-29 ENCOUNTER — Ambulatory Visit: Payer: Medicaid Other | Admitting: Physical Therapy

## 2022-08-29 ENCOUNTER — Encounter: Payer: Self-pay | Admitting: Physical Therapy

## 2022-08-29 DIAGNOSIS — M25562 Pain in left knee: Secondary | ICD-10-CM

## 2022-08-29 DIAGNOSIS — M25652 Stiffness of left hip, not elsewhere classified: Secondary | ICD-10-CM

## 2022-08-29 DIAGNOSIS — R262 Difficulty in walking, not elsewhere classified: Secondary | ICD-10-CM | POA: Diagnosis not present

## 2022-08-29 NOTE — Therapy (Signed)
OUTPATIENT PHYSICAL THERAPY TREATMENT NOTE   Patient Name: Keith Alvarado MRN: [id number] DOB:[birth year], 47 y.o., male Today's Date: 08/29/2022  PCP: none REFERRING PROVIDER: Ainsley Spinner, PA-C    END OF SESSION:   PT End of Session - 08/29/22 1106     Visit Number 11    Number of Visits 20    Date for PT Re-Evaluation 10/15/22    Authorization Type [place] [employer] access    Authorization Time Period 1/3 - 09/03/22    Authorization - Visit Number 1    Authorization - Number of Visits 3    PT Start Time 1105    PT Stop Time 1145    PT Time Calculation (min) 40 min    Activity Tolerance Patient tolerated treatment well    Behavior During Therapy [facility] for tasks assessed/performed                   Past Medical History:  Diagnosis Date   Medical history non-contributory    Vitamin D deficiency 05/17/2022   Past Surgical History:  Procedure Laterality Date   FOOT SURGERY Left    JOINT REPLACEMENT Right 2021   ORIF TIBIA PLATEAU Left 05/16/2022   Procedure: OPEN REDUCTION INTERNAL FIXATION (ORIF) TIBIAL PLATEAU;  Surgeon: Altamese Lester [place], MD;  Location: [place];  Service: Orthopedics;  Laterality: Left;   TOTAL HIP ARTHROPLASTY Right 07/17/2020   Procedure: RIGHT TOTAL HIP ARTHROPLASTY ANTERIOR APPROACH;  Surgeon: Frederik Pear, MD;  Location: [facility];  Service: Orthopedics;  Laterality: Right;   TOTAL HIP ARTHROPLASTY Left 12/11/2020   Procedure: LEFT TOTAL HIP ARTHROPLASTY ANTERIOR APPROACH;  Surgeon: Frederik Pear, MD;  Location: [facility];  Service: Orthopedics;  Laterality: Left;   Patient Active Problem List   Diagnosis Date Noted   Vitamin D deficiency 05/17/2022   Closed fracture of lateral portion of left tibial plateau 05/16/2022   S/P hip replacement, left 12/11/2020   AVN (avascular necrosis of bone) ([place]) 12/06/2020   AVN of femur ([place]) 07/07/2020    REFERRING DIAG: Left tibial plateau fracture     THERAPY DIAG:  Difficulty in walking, not elsewhere  classified  Acute pain of left knee  Stiffness of left hip, not elsewhere classified  Rationale for Evaluation and Treatment Rehabilitation  PERTINENT HISTORY: ORIF L tibial plateus fx and meniscus repair with Dr [name] 05-16-22 Bil THR due to AVN   PRECAUTIONS:  Yes NWB for 6 weeks from surgery  from Ainsley Spinner secure message. Brace for 2 weeks and then WBAT for 2 weeks in brace before weaning.  Wean from brace after November 28th.   SUBJECTIVE:  SUBJECTIVE STATEMENT:  " I am doing better, I think I am getting stronger. I have some imaging set up which I'm sure why."   PAIN:  Are you having pain? Yes: NPRS scale: 4/10 Pain location: Lt knee Pain description: ache Aggravating factors: movement Relieving factors: medicine   OBJECTIVE: (objective measures completed at initial evaluation unless otherwise dated)  DIAGNOSTIC FINDINGS:  05-16-22 There is internal fixation of comminuted fracture in the medial tibial plateau with metallic plate and surgical screws. There is is surgical drain in the soft tissues along the lateral aspect. There is possible minimal effusion in suprapatellar bursa.   IMPRESSION: Status post internal fixation of comminuted fracture of lateral plateau of left tibia.     PATIENT SURVEYS:  LEFS 21/80  26.3% disability   08-15-22  LEFS 56/80  70%  COGNITION: Overall cognitive status: Within functional limits for tasks assessed                         SENSATION: Numbness on top of ankle and down Left shin bone   EDEMA:  Circumferential: Left knee 43 cm  R 39 cm   MUSCLE LENGTH: Hamstrings: Right 74 deg; Left 60 deg     POSTURE: rounded shoulders, forward head, and tall and within normal BMI   PALPATION: TTP over medial knee down into shin and on top of ankle   LOWER  EXTREMITY ROM:   Active ROM Right eval Left eval 07/10/22 Left 07/17/22 Left 07-23-22 Left 08-15-22 Left 08/20/22 Left   Hip flexion           Hip extension           Hip abduction           Hip adduction           Hip internal rotation           Hip external rotation           Knee flexion 135/P139 105/P109 104 106 active 110 Active 112/PROM 120 110  Knee extension 0/+4 hyperext 12deficit/P 10 deficit  -5 active -3 active -3 Active -3  Ankle dorsiflexion 9 -11defict/stiff  -20 active supine -17 active supine    Ankle plantarflexion           Ankle inversion           Ankle eversion            (Blank rows = not tested)   LOWER EXTREMITY MMT:   MMT Right eval Left eval RT/LT 08-15-22 LT 08/20/22  Hip flexion 5 5 5/5   Hip extension        Hip abduction 5   5/4 4  Hip adduction        Hip internal rotation        Hip external rotation        Knee flexion 5 3- 5/4 4  Knee extension 5 3-  5/4 4  Ankle dorsiflexion 5   5/4+   Ankle plantarflexion        Ankle inversion        Ankle eversion         (Blank rows = not tested)     FUNCTIONAL TESTS:  5 times sit to stand: 17.34 sec and Wt bears to Right  5 x STS  13.30 sec 08/20/22: 5 x STS 13.3 sec    GAIT: Distance walked: 250 ft  Assistive device utilized: Crutches Level of assistance:  Complete Independence Comments: Pt able to bear wt in Left LE but wt bears to right.  Pt without brace as per MD order and did not attempt stairs at eval  TODAY'S TREATMENT  [facility] Adult PT Treatment:                                                DATE: 08/29/2022 Therapeutic Exercise: Nu-step L 7 x 7 min LE only with cue to keep steady pace ( goal for endurance training) Stand calf stretch on slant board 2 x 30 sec Standing quad stretch with LLE foot in chair 2 x 30 sec Standing hip abduction 2 x 20 with RTB Step up 2 x 20 LLE only on 6 inch step Standing marching with RTB around foot requiring DF 2 x 20 alternating L/R Mini wall  squat 1 x 5, progressed with moving feet out 6 inch 1 x 5, 3rd set keeping foot position and performing 1 x 5 holding 5 sec ea.   Updated HEP to include standing hip abduction, standing marching   [facility] Adult PT Treatment:                                                DATE: 08/20/22 Therapeutic Exercise: Rec Bike L3 x 6 minutes  Standing heel raises 10 x 2  Left gastroc stretch x 3 for 30 sec  Horizontal Leg Press :40# bil  Leg press 20# single LE  x 10 Knee ext OMEGA 10# bil x 20  Knee ext OMEGA 5# bil to Left eccentric - 1/2 ROM  Left LAQ 5 # x 15  Seated OMEGA Knee flexion 35# blat with eccentric focus  QS into towel  SLR with initial QS.  STS x 10- cues for equal weight bearing    [facility] Adult PT Treatment:                                                DATE: 08-15-22  Therapeutic Exercise: Recumbent bike L5 x 5 min - seat lowered at 2:30 sec to promote knee flexion Slant board calf stretch 2 x 30  Leg press (cybex)1 x 15 25 #,  2 x 10 35# LLE only Between leg press sets performed 2 x 20 heel raised 25# Seated knee extension 3 x 10 with 15#  with BIL concentric and eccentric lowering LT Seated hamstring curl 2 x 10 with 35# 1 set bil LE, 2nd set con bil/ ecc L only     PATIENT EDUCATION:  Education details: continue HEP Person educated: Patient Education method: Explanation Education comprehension: verbalized understanding   HOME EXERCISE PROGRAM: Access Code: [email or web address] URL: [email or web address] Date: 08/29/2022 Prepared by: Starr Lake  Exercises - Supine active Straight Leg Raise in Brace until July 16, 2022  - 1 x daily - 7 x weekly - 3 sets - 10 reps - Long Sitting Quad Set with Towel Roll Under Heel  - 1 x daily - 7 x weekly - 3 sets - 10 reps - Supine Heel Slide  - 1 x daily - 7 x weekly -  3 sets - 10 reps - Seated Calf Stretch with Strap  - 1 x daily - 7 x weekly - 3 sets - 3 reps - 30-60-sec hold - Standing Gastroc Stretch  - 1 x daily  - 7 x weekly - 1 sets - 10 reps - 5-10 hold - Mini Squat with Counter Support  - 1 x daily - 7 x weekly - 2 sets - 10 reps - Sidelying Hip Abduction  - 1 x daily - 7 x weekly - 2 sets - 10 reps - Step Up  - 1 x daily - 7 x weekly - 3 sets - 10 reps - Lateral Step Up  - 1 x daily - 7 x weekly - 3 sets - 10 reps - Standing Terminal Knee Extension with Resistance  - 1 x daily - 7 x weekly - 3 sets - 10 reps - Standing Knee Flexion  - 1 x daily - 7 x weekly - 2 sets - 10 reps - Seated Heel Slide  - 1 x daily - 7 x weekly - 2 sets - 10 reps - Standing Hip Abduction with Resistance at Ankles and Counter Support  - 1 x daily - 7 x weekly - 3 sets - 20 reps - Marching with Resistance  - 1 x daily - 7 x weekly - 3 sets - 20 reps - Wall Squat  - 1 x daily - 7 x weekly - 2 sets - 10 reps - hold 5 seconds hold   ASSESSMENT:   CLINICAL IMPRESSION: Pt arrives to session reporting he feels that he is getting strong and able to do more. Continued working on gross LE strengthening which he did well with. Worked on adding increased reps and resistance to maximize strengthening. Overall He is making good progress with PT.     OBJECTIVE IMPAIRMENTS: difficulty walking, decreased ROM, decreased strength, increased edema, and pain.    ACTIVITY LIMITATIONS: standing, squatting, transfers, bathing, dressing, and hygiene/grooming   PARTICIPATION LIMITATIONS: community activity, occupation, and return to exercise and recreational golf /hiking   PERSONAL FACTORS: 1 comorbidity: BIL THA for AVN  are also affecting patient's functional outcome.    REHAB POTENTIAL: Excellent   CLINICAL DECISION MAKING: Evolving/moderate complexity   EVALUATION COMPLEXITY: Moderate     GOALS: Goals reviewed with patient? Yes   SHORT TERM GOALS: Target date: 07-23-22  Pt will be independent with initial HEP Baseline: no knowledge Status: 07/17/22 : independent Goal status: MET   2.  Pt will return to bed to sleep rather  than sleeping in recliner Baseline: eval must sleep in recliner Status: 07/17/22: has slept in bed 2 times.  08/20/22: sleeping in bed Goal status: MET   3.  Pain will decrease to 4/10 at worst/standing bending knee Baseline: eval pain up to 8/10 Status: 07/17/22: 7/10  07-23-22  7/10 08/20/22: 6-7/10 with ROM exercises and end of day Goal status: ONGOING   4.  L knee AROM flexion will improve to 5-115 degrees for improved mobility to ride in car without increased pain Baseline: See AROM chart Status: 07/17/22: 5-106 degrees Status 08/20/22  3-110 degrees Goal status: PARTIALLY MET   5.  Pt will be able to stand with even wt distribution in bil LE Baseline: Pt wt bears to right Status: 07/17/22: continues to bear wt to RLE, needs cues  1/2/-24 needs cues Goal status: partially met   6.  Pt with DF of L will be at 0 neutral  Baseline  -11 from neutral  Status: -20 from neutral   Status -17 from neutral 07/23/22            Goal Status: ONGOING     LONG TERM GOALS: Target date: 10/01/2022     Pt will be independent with advanced HEP Baseline: No knowledge Goal status: ONGOING   2.  Pt will be able to walk 2 miles for exercise Baseline: unable to stand for greater than 10 min 08/20/22: can walk 30 minutes  Goal status: ONGOING   3.  Pt will be able to get back to recreational pursuits, hiking , golf and kayaking Baseline: unable to participate due to knee surgery 08/20/22: unable to participate , working on walking Goal status: ONGOING   4.  PT with be able to walk/stand >/= 1 hour with no AD with </= 2/10 pain for functional endurance and return to leisure activities  Baseline: Pt unable to stand for greater than 10 min with 6-8/10 pain 08/20/22: can walk for 30 minutes, has increased pain end of day to 6-7/10 Goal status: ONGOING   5.  Pt will be able to negotiate steps without any AD and with safe execution Baseline: unable at eval due to not having brace12-28-23  Pt  negotiating steps daily with 5/10 pain at apartment 08/20/22: able to negotiate steps independently  with HR and compensations Goal status: PARTIALLY MET   6.  Patient will report improved functional level on LEFS >/= 70/80 in order to allow for return to prior exercise level Baseline: LEFS 21/80 26.3 % disability  08-15-22 70.0% Goal status: MET     PLAN:   PT FREQUENCY: 2x/week   PT DURATION: 10/15/2022   PLANNED INTERVENTIONS: Therapeutic exercises, Therapeutic activity, Neuromuscular re-education, Balance training, Gait training, Patient/Family education, Self Care, Joint mobilization, Stair training, DME instructions, Dry Needling, Electrical stimulation, Cryotherapy, Moist heat, scar mobilization, Taping, Manual therapy, and Re-evaluation   PLAN FOR NEXT SESSION: Review HEP,  knee ROM and strengthening; gait training. Instructed to bring shoes to work on progressive strengthening.     Glenda Kunst PT, DPT, LAT, ATC  08/29/22  11:52 AM

## 2022-09-03 ENCOUNTER — Ambulatory Visit: Payer: Medicaid Other | Admitting: Physical Therapy

## 2022-09-03 DIAGNOSIS — R262 Difficulty in walking, not elsewhere classified: Secondary | ICD-10-CM | POA: Diagnosis not present

## 2022-09-03 DIAGNOSIS — M6281 Muscle weakness (generalized): Secondary | ICD-10-CM

## 2022-09-03 DIAGNOSIS — M25562 Pain in left knee: Secondary | ICD-10-CM

## 2022-09-03 DIAGNOSIS — M25551 Pain in right hip: Secondary | ICD-10-CM

## 2022-09-03 DIAGNOSIS — R2689 Other abnormalities of gait and mobility: Secondary | ICD-10-CM

## 2022-09-03 DIAGNOSIS — Z96641 Presence of right artificial hip joint: Secondary | ICD-10-CM

## 2022-09-03 DIAGNOSIS — M25651 Stiffness of right hip, not elsewhere classified: Secondary | ICD-10-CM

## 2022-09-03 DIAGNOSIS — M25552 Pain in left hip: Secondary | ICD-10-CM

## 2022-09-03 DIAGNOSIS — M25652 Stiffness of left hip, not elsewhere classified: Secondary | ICD-10-CM

## 2022-09-03 NOTE — Therapy (Signed)
OUTPATIENT PHYSICAL THERAPY TREATMENT NOTE   Patient Name: Keith Alvarado MRN: [id number] DOB:[birth year], 47 y.o., male Today's Date: 09/03/2022  PCP: none REFERRING PROVIDER: Ainsley Spinner, PA-C    END OF SESSION:   PT End of Session - 09/03/22 1113     Visit Number 12    Number of Visits 20    Date for PT Re-Evaluation 10/15/22    Authorization Type [employer] access    Authorization Time Period 1/3 - 09/03/22    PT Start Time 1111    PT Stop Time 1145    PT Time Calculation (min) 34 min    Activity Tolerance Patient tolerated treatment well    Behavior During Therapy [facility] for tasks assessed/performed                   Past Medical History:  Diagnosis Date   Medical history non-contributory    Vitamin D deficiency 05/17/2022   Past Surgical History:  Procedure Laterality Date   FOOT SURGERY Left    JOINT REPLACEMENT Right 2021   ORIF TIBIA PLATEAU Left 05/16/2022   Procedure: OPEN REDUCTION INTERNAL FIXATION (ORIF) TIBIAL PLATEAU;  Surgeon: Altamese [place], MD;  Location: [facility];  Service: Orthopedics;  Laterality: Left;   TOTAL HIP ARTHROPLASTY Right 07/17/2020   Procedure: RIGHT TOTAL HIP ARTHROPLASTY ANTERIOR APPROACH;  Surgeon: Frederik Pear, MD;  Location: [facility];  Service: Orthopedics;  Laterality: Right;   TOTAL HIP ARTHROPLASTY Left 12/11/2020   Procedure: LEFT TOTAL HIP ARTHROPLASTY ANTERIOR APPROACH;  Surgeon: Frederik Pear, MD;  Location: [facility];  Service: Orthopedics;  Laterality: Left;   Patient Active Problem List   Diagnosis Date Noted   Vitamin D deficiency 05/17/2022   Closed fracture of lateral portion of left tibial plateau 05/16/2022   S/P hip replacement, left 12/11/2020   AVN (avascular necrosis of bone) ([place]) 12/06/2020   AVN of femur ([place]) 07/07/2020    REFERRING DIAG: Left tibial plateau fracture     THERAPY DIAG:  Difficulty in walking, not elsewhere classified  Acute pain of left knee  Stiffness of left hip, not elsewhere  classified  Other abnormalities of gait and mobility  Stiffness of right hip, not elsewhere classified  Pain in right hip  Pain in left hip  Muscle weakness (generalized)  Status post total hip replacement, right  Rationale for Evaluation and Treatment Rehabilitation  PERTINENT HISTORY: ORIF L tibial plateus fx and meniscus repair with Dr [name] 05-16-22 Bil THR due to AVN   PRECAUTIONS:  Yes NWB for 6 weeks from surgery  from Ainsley Spinner secure message. Brace for 2 weeks and then WBAT for 2 weeks in brace before weaning.  Wean from brace after November 28th.   SUBJECTIVE:  SUBJECTIVE STATEMENT:  I have been steps better at my house. I am trying not to hobble as much as before   PAIN: 09-03-22  4/10 Are you having pain? Yes: NPRS scale: 4/10 Pain location: Lt knee Pain description: ache Aggravating factors: movement Relieving factors: medicine   OBJECTIVE: (objective measures completed at initial evaluation unless otherwise dated)  DIAGNOSTIC FINDINGS:  05-16-22 There is internal fixation of comminuted fracture in the medial tibial plateau with metallic plate and surgical screws. There is is surgical drain in the soft tissues along the lateral aspect. There is possible minimal effusion in suprapatellar bursa.   IMPRESSION: Status post internal fixation of comminuted fracture of lateral plateau of left tibia.     PATIENT SURVEYS:  LEFS 21/80  26.3% disability   08-15-22  LEFS 56/80  70%  COGNITION: Overall cognitive status: Within functional limits for tasks assessed                         SENSATION: Numbness on top of ankle and down Left shin bone   EDEMA:  Circumferential: Left knee 43 cm  R 39 cm   MUSCLE LENGTH: Hamstrings: Right 74 deg; Left 60 deg     POSTURE: rounded  shoulders, forward head, and tall and within normal BMI   PALPATION: TTP over medial knee down into shin and on top of ankle   LOWER EXTREMITY ROM:   Active ROM Right eval Left eval 07/10/22 Left 07/17/22 Left 07-23-22 Left 08-15-22 Left 08/20/22 Left  09-03-22 LT  Hip flexion            Hip extension            Hip abduction            Hip adduction            Hip internal rotation            Hip external rotation            Knee flexion 135/P139 105/P109 104 106 active 110 Active 112/PROM 120 110 117  Knee extension 0/+4 hyperext 12deficit/P 10 deficit  -5 active -3 active -3 Active -3 -3  Ankle dorsiflexion 9 -11defict/stiff  -20 active supine -17 active supine   -17 active supine  Ankle plantarflexion            Ankle inversion            Ankle eversion             (Blank rows = not tested)   LOWER EXTREMITY MMT:   MMT Right eval Left eval RT/LT 08-15-22 LT 08/20/22  Hip flexion 5 5 5/5   Hip extension        Hip abduction 5   5/4 4  Hip adduction        Hip internal rotation        Hip external rotation        Knee flexion 5 3- 5/4 4  Knee extension 5 3-  5/4 4  Ankle dorsiflexion 5   5/4+   Ankle plantarflexion        Ankle inversion        Ankle eversion         (Blank rows = not tested)     FUNCTIONAL TESTS:  5 times sit to stand: 17.34 sec and Wt bears to Right  5 x STS  13.30 sec 08/20/22: 5 x STS 13.3 sec  GAIT: Distance walked: 250 ft  Assistive device utilized: Crutches Level of assistance: Complete Independence Comments: Pt able to bear wt in Left LE but wt bears to right.  Pt without brace as per MD order and did not attempt stairs at eval  TODAY'S TREATMENT [facility] Adult PT Treatment:                                                DATE: 09-03-22 Therapeutic Exercise: Nu-step L 7 x 7 min LE only with cue to keep steady pace ( goal for endurance training) Stand calf stretch on slant board 2 x 30 sec Standing quad stretch with LLE foot in chair  2 x 30 sec Standing hip abduction 2 x 20 with GTB Standing hip extension 2 x 20 with GTB Standing LT KB DF and march with 10 lb 2 x 10 Step up 2 x 20 LLE only on 6 inch step Standing marching with GTB around foot requiring DF 2 x 20 alternating L/R Leg press OMEGA 40# double LE  x 15 x 2 Leg press  OMEGA 35# singel LT x 15 x 2    [facility] Adult PT Treatment:                                                DATE: 08/29/2022 Therapeutic Exercise: Nu-step L 7 x 7 min LE only with cue to keep steady pace ( goal for endurance training) Stand calf stretch on slant board 2 x 30 sec Standing quad stretch with LLE foot in chair 2 x 30 sec Standing hip abduction 2 x 20 with RTB Step up 2 x 20 LLE only on 6 inch step Standing marching with RTB around foot requiring DF 2 x 20 alternating L/R Mini wall squat 1 x 5, progressed with moving feet out 6 inch 1 x 5, 3rd set keeping foot position and performing 1 x 5 holding 5 sec ea.     [facility] Adult PT Treatment:                                                DATE: 08/20/22 Therapeutic Exercise: Rec Bike L3 x 6 minutes  Standing heel raises 10 x 2  Left gastroc stretch x 3 for 30 sec  Horizontal Leg Press :40# bil  Leg press 20# single LE  x 10 Knee ext OMEGA 10# bil x 20  Knee ext OMEGA 5# bil to Left eccentric - 1/2 ROM  Left LAQ 5 # x 15  Seated OMEGA Knee flexion 35# blat with eccentric focus  QS into towel  SLR with initial QS.  STS x 10- cues for equal weight bearing    [facility] Adult PT Treatment:                                                DATE: 08-15-22  Therapeutic Exercise: Recumbent bike L5 x 5 min - seat lowered at 2:30 sec  to promote knee flexion Slant board calf stretch 2 x 30  Leg press (cybex)1 x 15 25 #,  2 x 10 35# LLE only Between leg press sets performed 2 x 20 heel raised 25# Seated knee extension 3 x 10 with 15#  with BIL concentric and eccentric lowering LT Seated hamstring curl 2 x 10 with 35# 1 set bil LE, 2nd set con bil/  ecc L only     PATIENT EDUCATION:  Education details: continue HEP Person educated: Patient Education method: Explanation Education comprehension: verbalized understanding   HOME EXERCISE PROGRAM: Access Code: [email or web address] URL: [email or web address] Date: 08/29/2022 Prepared by: Starr Lake  Exercises - Supine active Straight Leg Raise in Brace until July 16, 2022  - 1 x daily - 7 x weekly - 3 sets - 10 reps - Long Sitting Quad Set with Towel Roll Under Heel  - 1 x daily - 7 x weekly - 3 sets - 10 reps - Supine Heel Slide  - 1 x daily - 7 x weekly - 3 sets - 10 reps - Seated Calf Stretch with Strap  - 1 x daily - 7 x weekly - 3 sets - 3 reps - 30-60-sec hold - Standing Gastroc Stretch  - 1 x daily - 7 x weekly - 1 sets - 10 reps - 5-10 hold - Mini Squat with Counter Support  - 1 x daily - 7 x weekly - 2 sets - 10 reps - Sidelying Hip Abduction  - 1 x daily - 7 x weekly - 2 sets - 10 reps - Step Up  - 1 x daily - 7 x weekly - 3 sets - 10 reps - Lateral Step Up  - 1 x daily - 7 x weekly - 3 sets - 10 reps - Standing Terminal Knee Extension with Resistance  - 1 x daily - 7 x weekly - 3 sets - 10 reps - Standing Knee Flexion  - 1 x daily - 7 x weekly - 2 sets - 10 reps - Seated Heel Slide  - 1 x daily - 7 x weekly - 2 sets - 10 reps - Standing Hip Abduction with Resistance at Ankles and Counter Support  - 1 x daily - 7 x weekly - 3 sets - 20 reps - Marching with Resistance  - 1 x daily - 7 x weekly - 3 sets - 20 reps - Wall Squat  - 1 x daily - 7 x weekly - 2 sets - 10 reps - hold 5 seconds hold   ASSESSMENT:   CLINICAL IMPRESSION: Pt arrives to session reporting 4/10 pain much improved from evaluation and ability to negotiate steps is better than before. Pt improving walking and distance is a little over 2 miles.  Pt would benefit from additional visits to progress toward completion of all LTG's.  Making good progress but needing endurance and strengthening in  order to negotiate steps in his home with decreased pain and better mobility. Pt is not able to pursue recreational pursuits such as hiking. Kayaking and golf and would like to return to active lifestyle with enough strength to participate without increased pain.     OBJECTIVE IMPAIRMENTS: difficulty walking, decreased ROM, decreased strength, increased edema, and pain.    ACTIVITY LIMITATIONS: standing, squatting, transfers, bathing, dressing, and hygiene/grooming   PARTICIPATION LIMITATIONS: community activity, occupation, and return to exercise and recreational golf /hiking   PERSONAL FACTORS: 1 comorbidity: BIL THA for AVN  are also affecting patient's functional outcome.  REHAB POTENTIAL: Excellent   CLINICAL DECISION MAKING: Evolving/moderate complexity   EVALUATION COMPLEXITY: Moderate     GOALS: Goals reviewed with patient? Yes   SHORT TERM GOALS: Target date: 07-23-22  Pt will be independent with initial HEP Baseline: no knowledge Status: 07/17/22 : independent Goal status: MET   2.  Pt will return to bed to sleep rather than sleeping in recliner Baseline: eval must sleep in recliner Status: 07/17/22: has slept in bed 2 times.  08/20/22: sleeping in bed Goal status: MET   3.  Pain will decrease to 4/10 at worst/standing bending knee Baseline: eval pain up to 8/10 Status: 07/17/22: 7/10  07-23-22  7/10 08/20/22: 6-7/10 with ROM exercises and end of day 09-03-22 4/10 with ROM exercises and enf of day Goal status: MET   4.  L knee AROM flexion will improve to 5-115 degrees for improved mobility to ride in car without increased pain Baseline: See AROM chart Status: 07/17/22: 5-106 degrees Status 08/20/22  3-110 degrees Status 09-03-22 3-117 Goal status: MET   5.  Pt will be able to stand with even wt distribution in bil LE Baseline: Pt wt bears to right Status: 07/17/22: continues to bear wt to RLE, needs cues  1/2/-24 needs cues Goal status: partially met   6.  Pt  with DF of L will be at 0 neutral             Baseline  -11 from neutral  Status: -20 from neutral   Status -17 from neutral 07/23/22            Goal Status: ONGOING     LONG TERM GOALS: Target date: 10/01/2022 revised 10-15-22     Pt will be independent with advanced HEP Baseline: No knowledge 09-03-22  Pt given advanced HEP to reinforce Goal status: ONGOING   2.  Pt will be able to walk 2 miles for exercise Baseline: unable to stand for greater than 10 min 08/20/22: can walk 30 minutes  09-03-22  walks one mile and is pursuing 2 miles Goal status: ONGOING   3.  Pt will be able to get back to recreational pursuits, hiking , golf and kayaking Baseline: unable to participate due to knee surgery 08/20/22: unable to participate , working on walking 09-03-22 have not tried hiking , golf or kayaking due to weather Goal status: ONGOING   4.  PT with be able to walk/stand >/= 1 hour with no AD with </= 2/10 pain for functional endurance and return to leisure activities  Baseline: Pt unable to stand for greater than 10 min with 6-8/10 pain 08/20/22: can walk for 30 minutes, has increased pain end of day to 6-7/10 Goal status: ONGOING   5.  Pt will be able to negotiate steps without any AD and with safe execution Baseline: unable at eval due to not having brace12-28-23  Pt negotiating steps daily with 5/10 pain at apartment 08/20/22: able to negotiate steps independently  with HR and compensations 09-03-22  Pt reports 50% better on steps. Goal status: PARTIALLY MET   6.  Patient will report improved functional level on LEFS >/= 70/80 in order to allow for return to prior exercise level Baseline: LEFS 21/80 26.3 % disability  08-15-22 70.0% Goal status: MET     PLAN:   PT FREQUENCY: 2x/week   PT DURATION: 10/15/2022   PLANNED INTERVENTIONS: Therapeutic exercises, Therapeutic activity, Neuromuscular re-education, Balance training, Gait training, Patient/Family education, Self Care, Joint  mobilization, Stair training, DME  instructions, Dry Needling, Electrical stimulation, Cryotherapy, Moist heat, scar mobilization, Taping, Manual therapy, and Re-evaluation   PLAN FOR NEXT SESSION: Review HEP,  knee ROM and strengthening; gait training. Instructed to bring shoes to work on progressive strengthening.     Voncille Lo, PT, [place] Certified Exercise Expert for the Aging Adult  09/03/22 1:27 PM Phone: [phone] Fax: [phone]

## 2022-09-05 ENCOUNTER — Ambulatory Visit: Payer: Medicaid Other | Admitting: Physical Therapy

## 2022-09-10 ENCOUNTER — Telehealth: Payer: Self-pay | Admitting: Physical Therapy

## 2022-09-10 ENCOUNTER — Ambulatory Visit: Payer: Medicaid Other | Admitting: Physical Therapy

## 2022-09-10 NOTE — Telephone Encounter (Signed)
LVM for  pt about NS this morning at 11:00 AM and to remind of upcoming appt on Thursday 09-12-22. PT left message reminding of attendance and cancellation policy and to call front desk if he is not able to make appt in the future.   Voncille Lo, PT, [place] Certified Exercise Expert for the Aging Adult  09/10/22 1:32 PM Phone: [phone] Fax: [phone]

## 2022-09-10 NOTE — Therapy (Deleted)
OUTPATIENT PHYSICAL THERAPY TREATMENT NOTE   Patient Name: Keith Alvarado MRN: [id number] DOB:11-Mar-1976, 47 y.o., male Today's Date: 09/03/2022  PCP: none REFERRING PROVIDER: Ainsley Spinner, PA-C    END OF SESSION:   PT End of Session - 09/03/22 1113     Visit Number 12    Number of Visits 20    Date for PT Re-Evaluation 10/15/22    Authorization Type [place] [facility] access    Authorization Time Period 1/3 - 09/03/22   12 visits  approved from 09-05-22   PT Start Time 1111    PT Stop Time 1145    PT Time Calculation (min) 34 min    Activity Tolerance Patient tolerated treatment well    Behavior During Therapy [facility] for tasks assessed/performed                   Past Medical History:  Diagnosis Date   Medical history non-contributory    Vitamin D deficiency 05/17/2022   Past Surgical History:  Procedure Laterality Date   FOOT SURGERY Left    JOINT REPLACEMENT Right 2021   ORIF TIBIA PLATEAU Left 05/16/2022   Procedure: OPEN REDUCTION INTERNAL FIXATION (ORIF) TIBIAL PLATEAU;  Surgeon: Altamese Goshen, MD;  Location: [facility];  Service: Orthopedics;  Laterality: Left;   TOTAL HIP ARTHROPLASTY Right 07/17/2020   Procedure: RIGHT TOTAL HIP ARTHROPLASTY ANTERIOR APPROACH;  Surgeon: Frederik Pear, MD;  Location: [facility];  Service: Orthopedics;  Laterality: Right;   TOTAL HIP ARTHROPLASTY Left 12/11/2020   Procedure: LEFT TOTAL HIP ARTHROPLASTY ANTERIOR APPROACH;  Surgeon: Frederik Pear, MD;  Location: [facility];  Service: Orthopedics;  Laterality: Left;   Patient Active Problem List   Diagnosis Date Noted   Vitamin D deficiency 05/17/2022   Closed fracture of lateral portion of left tibial plateau 05/16/2022   S/P hip replacement, left 12/11/2020   AVN (avascular necrosis of bone) ([place]) 12/06/2020   AVN of femur ([place]) 07/07/2020    REFERRING DIAG: Left tibial plateau fracture     THERAPY DIAG:  Difficulty in walking, not elsewhere classified  Acute pain of left  knee  Stiffness of left hip, not elsewhere classified  Other abnormalities of gait and mobility  Stiffness of right hip, not elsewhere classified  Pain in right hip  Pain in left hip  Muscle weakness (generalized)  Status post total hip replacement, right  Rationale for Evaluation and Treatment Rehabilitation  PERTINENT HISTORY: ORIF L tibial plateus fx and meniscus repair with Dr [name] 05-16-22 Bil THR due to AVN   PRECAUTIONS:  Yes NWB for 6 weeks from surgery  from Ainsley Spinner secure message. Brace for 2 weeks and then WBAT for 2 weeks in brace before weaning.  Wean from brace after November 28th.   SUBJECTIVE:  SUBJECTIVE STATEMENT:  I have been steps better at my house. I am trying not to hobble as much as before   PAIN: 09-03-22  4/10 Are you having pain? Yes: NPRS scale: 4/10 Pain location: Lt knee Pain description: ache Aggravating factors: movement Relieving factors: medicine   OBJECTIVE: (objective measures completed at initial evaluation unless otherwise dated)  DIAGNOSTIC FINDINGS:  05-16-22 There is internal fixation of comminuted fracture in the medial tibial plateau with metallic plate and surgical screws. There is is surgical drain in the soft tissues along the lateral aspect. There is possible minimal effusion in suprapatellar bursa.   IMPRESSION: Status post internal fixation of comminuted fracture of lateral plateau of left tibia.     PATIENT SURVEYS:  LEFS 21/80  26.3% disability   08-15-22  LEFS 56/80  70%  COGNITION: Overall cognitive status: Within functional limits for tasks assessed                         SENSATION: Numbness on top of ankle and down Left shin bone   EDEMA:  Circumferential: Left knee 43 cm  R 39 cm   MUSCLE LENGTH: Hamstrings: Right 74  deg; Left 60 deg     POSTURE: rounded shoulders, forward head, and tall and within normal BMI   PALPATION: TTP over medial knee down into shin and on top of ankle   LOWER EXTREMITY ROM:   Active ROM Right eval Left eval 07/10/22 Left 07/17/22 Left 07-23-22 Left 08-15-22 Left 08/20/22 Left  09-03-22 LT  Hip flexion            Hip extension            Hip abduction            Hip adduction            Hip internal rotation            Hip external rotation            Knee flexion 135/P139 105/P109 104 106 active 110 Active 112/PROM 120 110 117  Knee extension 0/+4 hyperext 12deficit/P 10 deficit  -5 active -3 active -3 Active -3 -3  Ankle dorsiflexion 9 -11defict/stiff  -20 active supine -17 active supine   -17 active supine  Ankle plantarflexion            Ankle inversion            Ankle eversion             (Blank rows = not tested)   LOWER EXTREMITY MMT:   MMT Right eval Left eval RT/LT 08-15-22 LT 08/20/22  Hip flexion 5 5 5/5   Hip extension        Hip abduction 5   5/4 4  Hip adduction        Hip internal rotation        Hip external rotation        Knee flexion 5 3- 5/4 4  Knee extension 5 3-  5/4 4  Ankle dorsiflexion 5   5/4+   Ankle plantarflexion        Ankle inversion        Ankle eversion         (Blank rows = not tested)     FUNCTIONAL TESTS:  5 times sit to stand: 17.34 sec and Wt bears to Right  5 x STS  13.30 sec 08/20/22: 5 x STS 13.3 sec  GAIT: Distance walked: 250 ft  Assistive device utilized: Crutches Level of assistance: Complete Independence Comments: Pt able to bear wt in Left LE but wt bears to right.  Pt without brace as per MD order and did not attempt stairs at eval  [facility]                                                DATE: 09-03-22 Therapeutic Exercise: Nu-step L 7 x 7 min LE only with cue to keep steady pace ( goal for endurance training) Stand calf stretch on slant board 2 x 30  sec Standing quad stretch with LLE foot in chair 2 x 30 sec Standing hip abduction 2 x 20 with GTB Standing hip extension 2 x 20 with GTB Standing LT KB DF and march with 10 lb 2 x 10 Step up 2 x 20 LLE only on 6 inch step Standing marching with GTB around foot requiring DF 2 x 20 alternating L/R Leg press OMEGA 40# double LE  x 15 x 2 Leg press  OMEGA 35# singel LT x 15 x 2    [facility] Adult PT Treatment:                                                DATE: 08/29/2022 Therapeutic Exercise: Nu-step L 7 x 7 min LE only with cue to keep steady pace ( goal for endurance training) Stand calf stretch on slant board 2 x 30 sec Standing quad stretch with LLE foot in chair 2 x 30 sec Standing hip abduction 2 x 20 with RTB Step up 2 x 20 LLE only on 6 inch step Standing marching with RTB around foot requiring DF 2 x 20 alternating L/R Mini wall squat 1 x 5, progressed with moving feet out 6 inch 1 x 5, 3rd set keeping foot position and performing 1 x 5 holding 5 sec ea.     [facility]:                                                DATE: 08/20/22 Therapeutic Exercise: Rec Bike L3 x 6 minutes  Standing heel raises 10 x 2  Left gastroc stretch x 3 for 30 sec  Horizontal Leg Press :40# bil  Leg press 20# single LE  x 10 Knee ext OMEGA 10# bil x 20  Knee ext OMEGA 5# bil to Left eccentric - 1/2 ROM  Left LAQ 5 # x 15  Seated OMEGA Knee flexion 35# blat with eccentric focus  QS into towel  SLR with initial QS.  STS x 10- cues for equal weight bearing    [facility] Adult PT Treatment:                                                DATE: 08-15-22  Therapeutic Exercise: Recumbent bike L5 x 5 min - seat lowered at 2:30 sec  to promote knee flexion Slant board calf stretch 2 x 30  Leg press (cybex)1 x 15 25 #,  2 x 10 35# LLE only Between leg press sets performed 2 x 20 heel raised 25# Seated knee extension 3 x 10 with 15#  with BIL concentric and eccentric lowering LT Seated hamstring  curl 2 x 10 with 35# 1 set bil LE, 2nd set con bil/ ecc L only     PATIENT EDUCATION:  Education details: continue HEP Person educated: Patient Education method: Explanation Education comprehension: verbalized understanding   HOME EXERCISE PROGRAM: Access Code: [email or web address] URL: [email or web address] Date: 08/29/2022 Prepared by: Lulu Riding  Exercises - Supine active Straight Leg Raise in Brace until July 16, 2022  - 1 x daily - 7 x weekly - 3 sets - 10 reps - Long Sitting Quad Set with Towel Roll Under Heel  - 1 x daily - 7 x weekly - 3 sets - 10 reps - Supine Heel Slide  - 1 x daily - 7 x weekly - 3 sets - 10 reps - Seated Calf Stretch with Strap  - 1 x daily - 7 x weekly - 3 sets - 3 reps - 30-60-sec hold - Standing Gastroc Stretch  - 1 x daily - 7 x weekly - 1 sets - 10 reps - 5-10 hold - Mini Squat with Counter Support  - 1 x daily - 7 x weekly - 2 sets - 10 reps - Sidelying Hip Abduction  - 1 x daily - 7 x weekly - 2 sets - 10 reps - Step Up  - 1 x daily - 7 x weekly - 3 sets - 10 reps - Lateral Step Up  - 1 x daily - 7 x weekly - 3 sets - 10 reps - Standing Terminal Knee Extension with Resistance  - 1 x daily - 7 x weekly - 3 sets - 10 reps - Standing Knee Flexion  - 1 x daily - 7 x weekly - 2 sets - 10 reps - Seated Heel Slide  - 1 x daily - 7 x weekly - 2 sets - 10 reps - Standing Hip Abduction with Resistance at Ankles and Counter Support  - 1 x daily - 7 x weekly - 3 sets - 20 reps - Marching with Resistance  - 1 x daily - 7 x weekly - 3 sets - 20 reps - Wall Squat  - 1 x daily - 7 x weekly - 2 sets - 10 reps - hold 5 seconds hold   ASSESSMENT:   CLINICAL IMPRESSION: Pt arrives to session reporting 4/10 pain much improved from evaluation and ability to negotiate steps is better than before. Pt improving walking and distance is a little over 2 miles.  Pt would benefit from additional visits to progress toward completion of all LTG's.  Making good  progress but needing endurance and strengthening in order to negotiate steps in his home with decreased pain and better mobility. Pt is not able to pursue recreational pursuits such as hiking. Kayaking and golf and would like to return to active lifestyle with enough strength to participate without increased pain.     OBJECTIVE IMPAIRMENTS: difficulty walking, decreased ROM, decreased strength, increased edema, and pain.    ACTIVITY LIMITATIONS: standing, squatting, transfers, bathing, dressing, and hygiene/grooming   PARTICIPATION LIMITATIONS: community activity, occupation, and return to exercise and recreational golf /hiking   PERSONAL FACTORS: 1 comorbidity: BIL THA for AVN  are also affecting patient's functional outcome.  REHAB POTENTIAL: Excellent   CLINICAL DECISION MAKING: Evolving/moderate complexity   EVALUATION COMPLEXITY: Moderate     GOALS: Goals reviewed with patient? Yes   SHORT TERM GOALS: Target date: 07-23-22  Pt will be independent with initial HEP Baseline: no knowledge Status: 07/17/22 : independent Goal status: MET   2.  Pt will return to bed to sleep rather than sleeping in recliner Baseline: eval must sleep in recliner Status: 07/17/22: has slept in bed 2 times.  08/20/22: sleeping in bed Goal status: MET   3.  Pain will decrease to 4/10 at worst/standing bending knee Baseline: eval pain up to 8/10 Status: 07/17/22: 7/10  07-23-22  7/10 08/20/22: 6-7/10 with ROM exercises and end of day 09-03-22 4/10 with ROM exercises and enf of day Goal status: MET   4.  L knee AROM flexion will improve to 5-115 degrees for improved mobility to ride in car without increased pain Baseline: See AROM chart Status: 07/17/22: 5-106 degrees Status 08/20/22  3-110 degrees Status 09-03-22 3-117 Goal status: MET   5.  Pt will be able to stand with even wt distribution in bil LE Baseline: Pt wt bears to right Status: 07/17/22: continues to bear wt to RLE, needs cues   1/2/-24 needs cues Goal status: partially met   6.  Pt with DF of L will be at 0 neutral             Baseline  -11 from neutral  Status: -20 from neutral   Status -17 from neutral 07/23/22            Goal Status: ONGOING     LONG TERM GOALS: Target date: 10/01/2022 revised 10-15-22     Pt will be independent with advanced HEP Baseline: No knowledge 09-03-22  Pt given advanced HEP to reinforce Goal status: ONGOING   2.  Pt will be able to walk 2 miles for exercise Baseline: unable to stand for greater than 10 min 08/20/22: can walk 30 minutes  09-03-22  walks one mile and is pursuing 2 miles Goal status: ONGOING   3.  Pt will be able to get back to recreational pursuits, hiking , golf and kayaking Baseline: unable to participate due to knee surgery 08/20/22: unable to participate , working on walking 09-03-22 have not tried hiking , golf or kayaking due to weather Goal status: ONGOING   4.  PT with be able to walk/stand >/= 1 hour with no AD with </= 2/10 pain for functional endurance and return to leisure activities  Baseline: Pt unable to stand for greater than 10 min with 6-8/10 pain 08/20/22: can walk for 30 minutes, has increased pain end of day to 6-7/10 Goal status: ONGOING   5.  Pt will be able to negotiate steps without any AD and with safe execution Baseline: unable at eval due to not having brace12-28-23  Pt negotiating steps daily with 5/10 pain at apartment 08/20/22: able to negotiate steps independently  with HR and compensations 09-03-22  Pt reports 50% better on steps. Goal status: PARTIALLY MET   6.  Patient will report improved functional level on LEFS >/= 70/80 in order to allow for return to prior exercise level Baseline: LEFS 21/80 26.3 % disability  08-15-22 70.0% Goal status: MET     PLAN:   PT FREQUENCY: 2x/week   PT DURATION: 10/15/2022   PLANNED INTERVENTIONS: Therapeutic exercises, Therapeutic activity, Neuromuscular re-education, Balance training,  Gait training, Patient/Family education, Self Care, Joint mobilization, Stair training, DME  instructions, Dry Needling, Electrical stimulation, Cryotherapy, Moist heat, scar mobilization, Taping, Manual therapy, and Re-evaluation   PLAN FOR NEXT SESSION: Review HEP,  knee ROM and strengthening; gait training. Instructed to bring shoes to work on progressive strengthening.     ***

## 2022-09-12 ENCOUNTER — Ambulatory Visit: Payer: Medicaid Other | Admitting: Physical Therapy

## 2022-09-12 ENCOUNTER — Ambulatory Visit
Admission: RE | Admit: 2022-09-12 | Discharge: 2022-09-12 | Disposition: A | Discharge status: Home / Self Care | Payer: Medicaid Other | Source: Ambulatory Visit | Attending: Orthopedic Surgery | Admitting: Orthopedic Surgery

## 2022-09-12 DIAGNOSIS — S83282D Other tear of lateral meniscus, current injury, left knee, subsequent encounter: Secondary | ICD-10-CM

## 2022-09-17 ENCOUNTER — Encounter: Payer: Self-pay | Admitting: Physical Therapy

## 2022-09-17 ENCOUNTER — Ambulatory Visit: Payer: Medicaid Other | Admitting: Physical Therapy

## 2022-09-17 DIAGNOSIS — M25562 Pain in left knee: Secondary | ICD-10-CM

## 2022-09-17 DIAGNOSIS — M25552 Pain in left hip: Secondary | ICD-10-CM

## 2022-09-17 DIAGNOSIS — R2689 Other abnormalities of gait and mobility: Secondary | ICD-10-CM

## 2022-09-17 DIAGNOSIS — R262 Difficulty in walking, not elsewhere classified: Secondary | ICD-10-CM

## 2022-09-17 DIAGNOSIS — M25651 Stiffness of right hip, not elsewhere classified: Secondary | ICD-10-CM

## 2022-09-17 DIAGNOSIS — M25652 Stiffness of left hip, not elsewhere classified: Secondary | ICD-10-CM

## 2022-09-17 DIAGNOSIS — Z96641 Presence of right artificial hip joint: Secondary | ICD-10-CM

## 2022-09-17 DIAGNOSIS — M25551 Pain in right hip: Secondary | ICD-10-CM

## 2022-09-17 DIAGNOSIS — M6281 Muscle weakness (generalized): Secondary | ICD-10-CM

## 2022-09-17 NOTE — Therapy (Incomplete)
OUTPATIENT PHYSICAL THERAPY TREATMENT NOTE   Patient Name: Keith Alvarado MRN: [id number] DOB:1976-05-28, 47 y.o., male Today's Date: 09/17/2022  PCP: none REFERRING PROVIDER: Montez Morita, PA-C    END OF SESSION:           Past Medical History:  Diagnosis Date   Medical history non-contributory    Vitamin D deficiency 05/17/2022   Past Surgical History:  Procedure Laterality Date   FOOT SURGERY Left    JOINT REPLACEMENT Right 2021   ORIF TIBIA PLATEAU Left 05/16/2022   Procedure: OPEN REDUCTION INTERNAL FIXATION (ORIF) TIBIAL PLATEAU;  Surgeon: Myrene Galas, MD;  Location: [facility] OR;  Service: Orthopedics;  Laterality: Left;   TOTAL HIP ARTHROPLASTY Right 07/17/2020   Procedure: RIGHT TOTAL HIP ARTHROPLASTY ANTERIOR APPROACH;  Surgeon: Gean Birchwood, MD;  Location: [facility];  Service: Orthopedics;  Laterality: Right;   TOTAL HIP ARTHROPLASTY Left 12/11/2020   Procedure: LEFT TOTAL HIP ARTHROPLASTY ANTERIOR APPROACH;  Surgeon: Gean Birchwood, MD;  Location: [facility];  Service: Orthopedics;  Laterality: Left;   Patient Active Problem List   Diagnosis Date Noted   Vitamin D deficiency 05/17/2022   Closed fracture of lateral portion of left tibial plateau 05/16/2022   S/P hip replacement, left 12/11/2020   AVN (avascular necrosis of bone) (HCC) 12/06/2020   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  Rationale for Evaluation and Treatment Rehabilitation  PERTINENT HISTORY: ORIF L tibial plateus fx and meniscus repair with Dr Carola Frost 05-16-22 Bil THR due to AVN   PRECAUTIONS:  Yes NWB for 6 weeks from surgery  from Montez Morita secure message. Brace for 2 weeks and then WBAT for 2 weeks in brace before weaning.  Wean from brace after November 28th.   SUBJECTIVE:                                                                                                                                                                                       SUBJECTIVE STATEMENT: 09-17-22 I am a 6-7/10 today.  I have been on my feet a lot but I don't use AD anymore. I still have pain in my ankle and LT knee   PAIN: 09-03-22  4/10 Are you having pain? Yes: NPRS scale: 4/10 Pain location: Lt knee Pain description: ache Aggravating factors: movement Relieving factors: medicine   OBJECTIVE: (objective measures completed at initial evaluation unless otherwise dated)  DIAGNOSTIC FINDINGS:  05-16-22 There is internal fixation of comminuted fracture in the medial tibial plateau with metallic plate and surgical screws. There is is surgical drain in  the soft tissues along the lateral aspect. There is possible minimal effusion in suprapatellar bursa.   IMPRESSION: Status post internal fixation of comminuted fracture of lateral plateau of left tibia.     PATIENT SURVEYS:   LEFS 21/80  26.3% disability   08-15-22  LEFS 56/80  70%   COGNITION: Overall cognitive status: Within functional limits for tasks assessed                         SENSATION: Numbness on top of ankle and down Left shin bone   EDEMA:  Circumferential: Left knee 43 cm  R 39 cm   MUSCLE LENGTH: Hamstrings: Right 74 deg; Left 60 deg     POSTURE: rounded shoulders, forward head, and tall and within normal BMI   PALPATION: TTP over medial knee down into shin and on top of ankle   LOWER EXTREMITY ROM:   Active ROM Right eval Left eval 07/10/22 Left 07/17/22 Left 07-23-22 Left 08-15-22 Left 08/20/22 Left  09-03-22 LT 09-17-22 LT  Hip flexion             Hip extension             Hip abduction             Hip adduction             Hip internal rotation             Hip external rotation             Knee flexion 135/P139 105/P109 104 106 active 110 Active 112/PROM 120 110 117 117  Knee extension 0/+4 hyperext 12deficit/P 10 deficit  -5 active -3 active -3 Active -3 -3 0  Ankle dorsiflexion 9 -11defict/stiff  -20 active  supine -17 active supine   -17 active supine -15 supine  Ankle plantarflexion             Ankle inversion             Ankle eversion              (Blank rows = not tested)   LOWER EXTREMITY MMT:   MMT Right eval Left eval RT/LT 08-15-22 LT 08/20/22 LT 09-17-22  Hip flexion 5 5 5/5  5  Hip extension         Hip abduction 5   5/4 4 4   Hip adduction         Hip internal rotation         Hip external rotation         Knee flexion 5 3- 5/4 4 4   Knee extension 5 3-  5/4 4 4+  Ankle dorsiflexion 5   5/4+  4+  Ankle plantarflexion       RT 25/25  LT 25/25   Ankle inversion         Ankle eversion          (Blank rows = not tested)     FUNCTIONAL TESTS:  5 times sit to stand: 17.34 sec and Wt bears to Right  5 x STS  13.30 sec 08/20/22: 5 x STS 13.3 sec    GAIT: Distance walked: 250 ft  Assistive device utilized: Crutches Level of assistance: Complete Independence Comments: Pt able to bear wt in Left LE but wt bears to right.  Pt without brace as per MD order and did not attempt stairs at eval  TODAY'S TREATMENT  [facility] Adult PT  Treatment:                                                DATE: 09-19-22 Therapeutic Exercise: *** Manual Therapy: *** Neuromuscular re-ed: *** Therapeutic Activity: *** Modalities: *** Self Care: ***  [facility] Adult PT Treatment:                                                DATE: 09-17-22 Therapeutic Exercise: Wall sit  10 x 10 sec hold Peroneal GTB 3 x 10 LT Monster walk side stepping with GTB around ankles 20 feet with pain in LT knee and then 3 x 20 feet with GTB around knees with decreased pain.  Stand calf stretch on slant board 2 x 30 sec Leg press OMEGA 55# double LE  x 15 x 2 Leg press  OMEGA 35# singel LT x 15 x 2 OMEGA knee ext BIL 15 x 2 35 #  attempted single limb but pain so declined after 2 x OMEGA knee flex  BIL 15 x 2 35# Manual Therapy: STW over lateral knee and lateral ankle over peroneals LT   [facility] Adult PT Treatment:                                                 DATE: 09-03-22 Therapeutic Exercise: Nu-step L 7 x 7 min LE only with cue to keep steady pace ( goal for endurance training) Stand calf stretch on slant board 2 x 30 sec Standing quad stretch with LLE foot in chair 2 x 30 sec Standing hip abduction 2 x 20 with GTB Standing hip extension 2 x 20 with GTB Standing LT KB DF and march with 10 lb 2 x 10 Step up 2 x 20 LLE only on 6 inch step Standing marching with GTB around foot requiring DF 2 x 20 alternating L/R Leg press OMEGA 40# double LE  x 15 x 2 Leg press  OMEGA 35# singel LT x 15 x 2    [facility] Adult PT Treatment:                                                DATE: 08/29/2022 Therapeutic Exercise: Nu-step L 7 x 7 min LE only with cue to keep steady pace ( goal for endurance training) Stand calf stretch on slant board 2 x 30 sec Standing quad stretch with LLE foot in chair 2 x 30 sec Standing hip abduction 2 x 20 with RTB Step up 2 x 20 LLE only on 6 inch step Standing marching with RTB around foot requiring DF 2 x 20 alternating L/R Mini wall squat 1 x 5, progressed with moving feet out 6 inch 1 x 5, 3rd set keeping foot position and performing 1 x 5 holding 5 sec ea.     [facility] Adult PT Treatment:  DATE: 08/20/22 Therapeutic Exercise: Rec Bike L3 x 6 minutes  Standing heel raises 10 x 2  Left gastroc stretch x 3 for 30 sec  Horizontal Leg Press :40# bil  Leg press 20# single LE  x 10 Knee ext OMEGA 10# bil x 20  Knee ext OMEGA 5# bil to Left eccentric - 1/2 ROM  Left LAQ 5 # x 15  Seated OMEGA Knee flexion 35# blat with eccentric focus  QS into towel  SLR with initial QS.  STS x 10- cues for equal weight bearing    [facility] Adult PT Treatment:                                                DATE: 08-15-22  Therapeutic Exercise: Recumbent bike L5 x 5 min - seat lowered at 2:30 sec to promote knee flexion Slant board calf stretch 2 x 30  Leg  press (cybex)1 x 15 25 #,  2 x 10 35# LLE only Between leg press sets performed 2 x 20 heel raised 25# Seated knee extension 3 x 10 with 15#  with BIL concentric and eccentric lowering LT Seated hamstring curl 2 x 10 with 35# 1 set bil LE, 2nd set con bil/ ecc L only     PATIENT EDUCATION:  Education details: continue HEP Person educated: Patient Education method: Explanation Education comprehension: verbalized understanding   HOME EXERCISE PROGRAM: Access Code: [email or web address] URL: [email or web address] Date: 09/19/2022 updated Prepared by: Voncille Lo  Exercises - Supine Active Straight Leg Raise  - 1 x daily - 7 x weekly - 3 sets - 10 reps - Long Sitting Quad Set with Towel Roll Under Heel  - 1 x daily - 7 x weekly - 3 sets - 10 reps - Supine Heel Slide  - 1 x daily - 7 x weekly - 3 sets - 10 reps - Seated Calf Stretch with Strap  - 1 x daily - 7 x weekly - 3 sets - 3 reps - 30-60-sec hold - Standing Gastroc Stretch  - 1 x daily - 7 x weekly - 1 sets - 10 reps - 5-10 hold - Mini Squat with Counter Support  - 1 x daily - 7 x weekly - 2 sets - 10 reps - Sidelying Hip Abduction  - 1 x daily - 7 x weekly - 2 sets - 10 reps - Step Up  - 1 x daily - 7 x weekly - 3 sets - 10 reps - Lateral Step Up  - 1 x daily - 7 x weekly - 3 sets - 10 reps - Standing Terminal Knee Extension with Resistance  - 1 x daily - 7 x weekly - 3 sets - 10 reps - Standing Knee Flexion  - 1 x daily - 7 x weekly - 2 sets - 10 reps - Seated Heel Slide  - 1 x daily - 7 x weekly - 2 sets - 10 reps - Standing Hip Abduction with Resistance at Ankles and Counter Support  - 1 x daily - 7 x weekly - 3 sets - 20 reps - Marching with Resistance  - 1 x daily - 7 x weekly - 3 sets - 20 reps - Wall Squat  - 1 x daily - 7 x weekly - 2 sets - 10 reps - hold 5 seconds hold - Lateral Monster Walk with  Resistance above knee  - 1 x daily - 7 x weekly - 3 sets - 10 reps   ASSESSMENT:   CLINICAL IMPRESSION: Pt  arrives to session reporting 6-710 pain  after much activity this past weekend.  Pt participates with exercise and on gym machines without increase in pain except with knee ext at SL LT at 35 after 2 x .  Pt improving walking and distance is a little over 2 miles and with increased activity. Pt has 12 visits 09-09-22 to 10-20-22 MCD approved and would benefit from continued skilled PT to maximize function.  . Pt is not able to pursue recreational pursuits such as hiking. Kayaking and golf and would like to return to active lifestyle with enough strength to participate without increased pain.     OBJECTIVE IMPAIRMENTS: difficulty walking, decreased ROM, decreased strength, increased edema, and pain.    ACTIVITY LIMITATIONS: standing, squatting, transfers, bathing, dressing, and hygiene/grooming   PARTICIPATION LIMITATIONS: community activity, occupation, and return to exercise and recreational golf /hiking   PERSONAL FACTORS: 1 comorbidity: BIL THA for AVN  are also affecting patient's functional outcome.    REHAB POTENTIAL: Excellent   CLINICAL DECISION MAKING: Evolving/moderate complexity   EVALUATION COMPLEXITY: Moderate     GOALS: Goals reviewed with patient? Yes   SHORT TERM GOALS: Target date: 07-23-22  Pt will be independent with initial HEP Baseline: no knowledge Status: 07/17/22 : independent Goal status: MET   2.  Pt will return to bed to sleep rather than sleeping in recliner Baseline: eval must sleep in recliner Status: 07/17/22: has slept in bed 2 times.  08/20/22: sleeping in bed Goal status: MET   3.  Pain will decrease to 4/10 at worst/standing bending knee Baseline: eval pain up to 8/10 Status: 07/17/22: 7/10  07-23-22  7/10 08/20/22: 6-7/10 with ROM exercises and end of day 09-03-22 4/10 with ROM exercises and enf of day Goal status: MET   4.  L knee AROM flexion will improve to 5-115 degrees for improved mobility to ride in car without increased pain Baseline: See  AROM chart Status: 07/17/22: 5-106 degrees Status 08/20/22  3-110 degrees Status 09-03-22 3-117 Goal status: MET   5.  Pt will be able to stand with even wt distribution in bil LE Baseline: Pt wt bears to right Status: 07/17/22: continues to bear wt to RLE, needs cues  1/2/-24 needs cues Goal status: partially met   6.  Pt with DF of L will be at 0 neutral             Baseline  -11 from neutral  Status: -20 from neutral   Status -17 from neutral 07/23/22            Goal Status: ONGOING     LONG TERM GOALS: Target date: 10/01/2022 revised 10-15-22     Pt will be independent with advanced HEP Baseline: No knowledge 09-03-22  Pt given advanced HEP to reinforce Goal status: ONGOING   2.  Pt will be able to walk 2 miles for exercise Baseline: unable to stand for greater than 10 min 08/20/22: can walk 30 minutes  09-03-22  walks one mile and is pursuing 2 miles Goal status: ONGOING   3.  Pt will be able to get back to recreational pursuits, hiking , golf and kayaking Baseline: unable to participate due to knee surgery 08/20/22: unable to participate , working on walking 09-03-22 have not tried hiking , golf or kayaking due to weather Goal  status: ONGOING   4.  PT with be able to walk/stand >/= 1 hour with no AD with </= 2/10 pain for functional endurance and return to leisure activities  Baseline: Pt unable to stand for greater than 10 min with 6-8/10 pain 08/20/22: can walk for 30 minutes, has increased pain end of day to 6-7/10 Goal status: ONGOING   5.  Pt will be able to negotiate steps without any AD and with safe execution Baseline: unable at eval due to not having brace12-28-23  Pt negotiating steps daily with 5/10 pain at apartment 08/20/22: able to negotiate steps independently  with HR and compensations 09-03-22  Pt reports 50% better on steps. Goal status: PARTIALLY MET   6.  Patient will report improved functional level on LEFS >/= 70/80 in order to allow for return to prior  exercise level Baseline: LEFS 21/80 26.3 % disability  08-15-22 70.0% Goal status: MET     PLAN:   PT FREQUENCY: 2x/week   PT DURATION: 10/15/2022   PLANNED INTERVENTIONS: Therapeutic exercises, Therapeutic activity, Neuromuscular re-education, Balance training, Gait training, Patient/Family education, Self Care, Joint mobilization, Stair training, DME instructions, Dry Needling, Electrical stimulation, Cryotherapy, Moist heat, scar mobilization, Taping, Manual therapy, and Re-evaluation   PLAN FOR NEXT SESSION: Review HEP,  knee ROM and strengthening; gait training. Instructed to bring shoes to work on progressive strengthening.  Check MRI/ new info on knee pain    ***

## 2022-09-17 NOTE — Therapy (Signed)
OUTPATIENT PHYSICAL THERAPY TREATMENT NOTE   Patient Name: Keith Alvarado MRN: [id number] DOB:[birth year], 47 y.o., male Today's Date: 09/17/2022  PCP: none REFERRING PROVIDER: Ainsley Spinner, PA-C    END OF SESSION:   PT End of Session - 09/17/22 1109     Visit Number 13    Number of Visits 34   revised with MCD approval 12 visits between 09-09-22 to 10-20-22   Date for PT Re-Evaluation 10/15/22    Authorization Type [place] MCD access    Authorization Time Period Approved 12 visits 09/09/22-10/20/22    Authorization - Visit Number 1    Authorization - Number of Visits 12    PT Start Time 1104    PT Stop Time 1145    PT Time Calculation (min) 41 min    Activity Tolerance Patient tolerated treatment well    Behavior During Therapy [facility] for tasks assessed/performed                   Past Medical History:  Diagnosis Date   Medical history non-contributory    Vitamin D deficiency 05/17/2022   Past Surgical History:  Procedure Laterality Date   FOOT SURGERY Left    JOINT REPLACEMENT Right 2021   ORIF TIBIA PLATEAU Left 05/16/2022   Procedure: OPEN REDUCTION INTERNAL FIXATION (ORIF) TIBIAL PLATEAU;  Surgeon: Altamese Birch River, MD;  Location: [facility];  Service: Orthopedics;  Laterality: Left;   TOTAL HIP ARTHROPLASTY Right 07/17/2020   Procedure: RIGHT TOTAL HIP ARTHROPLASTY ANTERIOR APPROACH;  Surgeon: Frederik Pear, MD;  Location: [facility];  Service: Orthopedics;  Laterality: Right;   TOTAL HIP ARTHROPLASTY Left 12/11/2020   Procedure: LEFT TOTAL HIP ARTHROPLASTY ANTERIOR APPROACH;  Surgeon: Frederik Pear, MD;  Location: [facility];  Service: Orthopedics;  Laterality: Left;   Patient Active Problem List   Diagnosis Date Noted   Vitamin D deficiency 05/17/2022   Closed fracture of lateral portion of left tibial plateau 05/16/2022   S/P hip replacement, left 12/11/2020   AVN (avascular necrosis of bone) ([place]) 12/06/2020   AVN of femur ([place]) 07/07/2020    REFERRING DIAG: Left  tibial plateau fracture     THERAPY DIAG:  Difficulty in walking, not elsewhere classified  Acute pain of left knee  Stiffness of left hip, not elsewhere classified  Other abnormalities of gait and mobility  Stiffness of right hip, not elsewhere classified  Pain in right hip  Pain in left hip  Muscle weakness (generalized)  Status post total hip replacement, right  Rationale for Evaluation and Treatment Rehabilitation  PERTINENT HISTORY: ORIF L tibial plateus fx and meniscus repair with Dr [name] 05-16-22 Bil THR due to AVN   PRECAUTIONS:  Yes NWB for 6 weeks from surgery  from Ainsley Spinner secure message. Brace for 2 weeks and then WBAT for 2 weeks in brace before weaning.  Wean from brace after November 28th.   SUBJECTIVE:  SUBJECTIVE STATEMENT: 09-17-22 I am a 6-7/10 today.  I have been on my feet a lot but I don't use AD anymore. I still have pain in my ankle and LT knee   PAIN: 09-03-22  4/10 Are you having pain? Yes: NPRS scale: 4/10 Pain location: Lt knee Pain description: ache Aggravating factors: movement Relieving factors: medicine   OBJECTIVE: (objective measures completed at initial evaluation unless otherwise dated)  DIAGNOSTIC FINDINGS:  05-16-22 There is internal fixation of comminuted fracture in the medial tibial plateau with metallic plate and surgical screws. There is is surgical drain in the soft tissues along the lateral aspect. There is possible minimal effusion in suprapatellar bursa.   IMPRESSION: Status post internal fixation of comminuted fracture of lateral plateau of left tibia.     PATIENT SURVEYS:   LEFS 21/80  26.3% disability   08-15-22  LEFS 56/80  70%   COGNITION: Overall cognitive status: Within functional limits for tasks assessed                          SENSATION: Numbness on top of ankle and down Left shin bone   EDEMA:  Circumferential: Left knee 43 cm  R 39 cm   MUSCLE LENGTH: Hamstrings: Right 74 deg; Left 60 deg     POSTURE: rounded shoulders, forward head, and tall and within normal BMI   PALPATION: TTP over medial knee down into shin and on top of ankle   LOWER EXTREMITY ROM:   Active ROM Right eval Left eval 07/10/22 Left 07/17/22 Left 07-23-22 Left 08-15-22 Left 08/20/22 Left  09-03-22 LT 09-17-22 LT  Hip flexion             Hip extension             Hip abduction             Hip adduction             Hip internal rotation             Hip external rotation             Knee flexion 135/P139 105/P109 104 106 active 110 Active 112/PROM 120 110 117 117  Knee extension 0/+4 hyperext 12deficit/P 10 deficit  -5 active -3 active -3 Active -3 -3 0  Ankle dorsiflexion 9 -11defict/stiff  -20 active supine -17 active supine   -17 active supine -15 supine  Ankle plantarflexion             Ankle inversion             Ankle eversion              (Blank rows = not tested)   LOWER EXTREMITY MMT:   MMT Right eval Left eval RT/LT 08-15-22 LT 08/20/22 LT 09-17-22  Hip flexion 5 5 5/5  5  Hip extension         Hip abduction 5   5/4 4 4   Hip adduction         Hip internal rotation         Hip external rotation         Knee flexion 5 3- 5/4 4 4   Knee extension 5 3-  5/4 4 4+  Ankle dorsiflexion 5   5/4+  4+  Ankle plantarflexion       RT 25/25  LT 25/25   Ankle inversion         Ankle  eversion          (Blank rows = not tested)     FUNCTIONAL TESTS:  5 times sit to stand: 17.34 sec and Wt bears to Right  5 x STS  13.30 sec 08/20/22: 5 x STS 13.3 sec    GAIT: Distance walked: 250 ft  Assistive device utilized: Crutches Level of assistance: Complete [place] Comments: Pt able to bear wt in Left LE but wt bears to right.  Pt without brace as per MD order and did not attempt stairs at eval  TODAY'S  TREATMENT [facility] Adult PT Treatment:                                                DATE: 09-17-22 Therapeutic Exercise: Wall sit  10 x 10 sec hold Peroneal GTB 3 x 10 LT Monster walk side stepping with GTB around ankles 20 feet with pain in LT knee and then 3 x 20 feet with GTB around knees with decreased pain.  Stand calf stretch on slant board 2 x 30 sec Leg press OMEGA 55# double LE  x 15 x 2 Leg press  OMEGA 35# singel LT x 15 x 2 OMEGA knee ext BIL 15 x 2 35 #  attempted single limb but pain so declined after 2 x OMEGA knee flex  BIL 15 x 2 35# Manual Therapy: STW over lateral knee and lateral ankle over peroneals LT   [facility] Adult PT Treatment:                                                DATE: 09-03-22 Therapeutic Exercise: Nu-step L 7 x 7 min LE only with cue to keep steady pace ( goal for endurance training) Stand calf stretch on slant board 2 x 30 sec Standing quad stretch with LLE foot in chair 2 x 30 sec Standing hip abduction 2 x 20 with GTB Standing hip extension 2 x 20 with GTB Standing LT KB DF and march with 10 lb 2 x 10 Step up 2 x 20 LLE only on 6 inch step Standing marching with GTB around foot requiring DF 2 x 20 alternating L/R Leg press OMEGA 40# double LE  x 15 x 2 Leg press  OMEGA 35# singel LT x 15 x 2    [facility] Adult PT Treatment:                                                DATE: 08/29/2022 Therapeutic Exercise: Nu-step L 7 x 7 min LE only with cue to keep steady pace ( goal for endurance training) Stand calf stretch on slant board 2 x 30 sec Standing quad stretch with LLE foot in chair 2 x 30 sec Standing hip abduction 2 x 20 with RTB Step up 2 x 20 LLE only on 6 inch step Standing marching with RTB around foot requiring DF 2 x 20 alternating L/R Mini wall squat 1 x 5, progressed with moving feet out 6 inch 1 x 5, 3rd set keeping foot position and performing 1 x 5 holding 5  sec ea.     [facility] Adult PT Treatment:                                                 DATE: 08/20/22 Therapeutic Exercise: Rec Bike L3 x 6 minutes  Standing heel raises 10 x 2  Left gastroc stretch x 3 for 30 sec  Horizontal Leg Press :40# bil  Leg press 20# single LE  x 10 Knee ext OMEGA 10# bil x 20  Knee ext OMEGA 5# bil to Left eccentric - 1/2 ROM  Left LAQ 5 # x 15  Seated OMEGA Knee flexion 35# blat with eccentric focus  QS into towel  SLR with initial QS.  STS x 10- cues for equal weight bearing    [facility] Adult PT Treatment:                                                DATE: 08-15-22  Therapeutic Exercise: Recumbent bike L5 x 5 min - seat lowered at 2:30 sec to promote knee flexion Slant board calf stretch 2 x 30  Leg press (cybex)1 x 15 25 #,  2 x 10 35# LLE only Between leg press sets performed 2 x 20 heel raised 25# Seated knee extension 3 x 10 with 15#  with BIL concentric and eccentric lowering LT Seated hamstring curl 2 x 10 with 35# 1 set bil LE, 2nd set con bil/ ecc L only     PATIENT EDUCATION:  Education details: continue HEP Person educated: Patient Education method: Explanation Education comprehension: verbalized understanding   HOME EXERCISE PROGRAM: Access Code: [email or web address] URL: [email or web address] Date: 08/29/2022 Prepared by: Starr Lake  Exercises - Supine active Straight Leg Raise in Brace until July 16, 2022  - 1 x daily - 7 x weekly - 3 sets - 10 reps - Long Sitting Quad Set with Towel Roll Under Heel  - 1 x daily - 7 x weekly - 3 sets - 10 reps - Supine Heel Slide  - 1 x daily - 7 x weekly - 3 sets - 10 reps - Seated Calf Stretch with Strap  - 1 x daily - 7 x weekly - 3 sets - 3 reps - 30-60-sec hold - Standing Gastroc Stretch  - 1 x daily - 7 x weekly - 1 sets - 10 reps - 5-10 hold - Mini Squat with Counter Support  - 1 x daily - 7 x weekly - 2 sets - 10 reps - Sidelying Hip Abduction  - 1 x daily - 7 x weekly - 2 sets - 10 reps - Step Up  - 1 x daily - 7 x weekly - 3 sets - 10 reps - Lateral  Step Up  - 1 x daily - 7 x weekly - 3 sets - 10 reps - Standing Terminal Knee Extension with Resistance  - 1 x daily - 7 x weekly - 3 sets - 10 reps - Standing Knee Flexion  - 1 x daily - 7 x weekly - 2 sets - 10 reps - Seated Heel Slide  - 1 x daily - 7 x weekly - 2 sets - 10 reps - Standing Hip Abduction with Resistance at Ankles and Counter  Support  - 1 x daily - 7 x weekly - 3 sets - 20 reps - Marching with Resistance  - 1 x daily - 7 x weekly - 3 sets - 20 reps - Wall Squat  - 1 x daily - 7 x weekly - 2 sets - 10 reps - hold 5 seconds hold   ASSESSMENT:   CLINICAL IMPRESSION: Pt arrives to session reporting 6-710 pain  after much activity this past weekend.  Pt participates with exercise and on gym machines without increase in pain except with knee ext at SL LT at 35 after 2 x .  Pt improving walking and distance is a little over 2 miles and with increased activity. Pt has 12 visits 09-09-22 to 10-20-22 MCD approved and would benefit from continued skilled PT to maximize function.  . Pt is not able to pursue recreational pursuits such as hiking. Kayaking and golf and would like to return to active lifestyle with enough strength to participate without increased pain.     OBJECTIVE IMPAIRMENTS: difficulty walking, decreased ROM, decreased strength, increased edema, and pain.    ACTIVITY LIMITATIONS: standing, squatting, transfers, bathing, dressing, and hygiene/grooming   PARTICIPATION LIMITATIONS: community activity, occupation, and return to exercise and recreational golf /hiking   PERSONAL FACTORS: 1 comorbidity: BIL THA for AVN  are also affecting patient's functional outcome.    REHAB POTENTIAL: Excellent   CLINICAL DECISION MAKING: Evolving/moderate complexity   EVALUATION COMPLEXITY: Moderate     GOALS: Goals reviewed with patient? Yes   SHORT TERM GOALS: Target date: 07-23-22  Pt will be independent with initial HEP Baseline: no knowledge Status: 07/17/22 :  independent Goal status: MET   2.  Pt will return to bed to sleep rather than sleeping in recliner Baseline: eval must sleep in recliner Status: 07/17/22: has slept in bed 2 times.  08/20/22: sleeping in bed Goal status: MET   3.  Pain will decrease to 4/10 at worst/standing bending knee Baseline: eval pain up to 8/10 Status: 07/17/22: 7/10  07-23-22  7/10 08/20/22: 6-7/10 with ROM exercises and end of day 09-03-22 4/10 with ROM exercises and enf of day Goal status: MET   4.  L knee AROM flexion will improve to 5-115 degrees for improved mobility to ride in car without increased pain Baseline: See AROM chart Status: 07/17/22: 5-106 degrees Status 08/20/22  3-110 degrees Status 09-03-22 3-117 Goal status: MET   5.  Pt will be able to stand with even wt distribution in bil LE Baseline: Pt wt bears to right Status: 07/17/22: continues to bear wt to RLE, needs cues  1/2/-24 needs cues Goal status: partially met   6.  Pt with DF of L will be at 0 neutral             Baseline  -11 from neutral  Status: -20 from neutral   Status -17 from neutral 07/23/22            Goal Status: ONGOING     LONG TERM GOALS: Target date: 10/01/2022 revised 10-15-22     Pt will be independent with advanced HEP Baseline: No knowledge 09-03-22  Pt given advanced HEP to reinforce Goal status: ONGOING   2.  Pt will be able to walk 2 miles for exercise Baseline: unable to stand for greater than 10 min 08/20/22: can walk 30 minutes  09-03-22  walks one mile and is pursuing 2 miles Goal status: ONGOING   3.  Pt will be able to  get back to recreational pursuits, hiking , golf and kayaking Baseline: unable to participate due to knee surgery 08/20/22: unable to participate , working on walking 09-03-22 have not tried hiking , golf or kayaking due to weather Goal status: ONGOING   4.  PT with be able to walk/stand >/= 1 hour with no AD with </= 2/10 pain for functional endurance and return to leisure activities   Baseline: Pt unable to stand for greater than 10 min with 6-8/10 pain 08/20/22: can walk for 30 minutes, has increased pain end of day to 6-7/10 Goal status: ONGOING   5.  Pt will be able to negotiate steps without any AD and with safe execution Baseline: unable at eval due to not having brace12-28-23  Pt negotiating steps daily with 5/10 pain at apartment 08/20/22: able to negotiate steps independently  with HR and compensations 09-03-22  Pt reports 50% better on steps. Goal status: PARTIALLY MET   6.  Patient will report improved functional level on LEFS >/= 70/80 in order to allow for return to prior exercise level Baseline: LEFS 21/80 26.3 % disability  08-15-22 70.0% Goal status: MET     PLAN:   PT FREQUENCY: 2x/week   PT DURATION: 10/15/2022   PLANNED INTERVENTIONS: Therapeutic exercises, Therapeutic activity, Neuromuscular re-education, Balance training, Gait training, Patient/Family education, Self Care, Joint mobilization, Stair training, DME instructions, Dry Needling, Electrical stimulation, Cryotherapy, Moist heat, scar mobilization, Taping, Manual therapy, and Re-evaluation   PLAN FOR NEXT SESSION: Review HEP,  knee ROM and strengthening; gait training. Instructed to bring shoes to work on progressive strengthening.  Check MRI/ new info on knee pain    Garen Lah, PT, ATRIC Certified Exercise Expert for the Aging Adult  09/17/22 12:49 PM Phone: [phone] Fax: [phone]

## 2022-09-19 ENCOUNTER — Ambulatory Visit: Payer: Medicaid Other | Admitting: Physical Therapy

## 2022-09-20 ENCOUNTER — Encounter: Payer: Self-pay | Admitting: Physical Therapy

## 2022-09-20 ENCOUNTER — Ambulatory Visit: Payer: Medicaid Other | Attending: Orthopedic Surgery | Admitting: Physical Therapy

## 2022-09-20 DIAGNOSIS — M25562 Pain in left knee: Secondary | ICD-10-CM | POA: Diagnosis present

## 2022-09-20 DIAGNOSIS — R262 Difficulty in walking, not elsewhere classified: Secondary | ICD-10-CM | POA: Diagnosis present
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
## 2022-09-25 NOTE — Therapy (Incomplete)
OUTPATIENT PHYSICAL THERAPY TREATMENT NOTE   Patient Name: Keith Alvarado MRN: [id number] DOB:[birth year], 47 y.o., male Today's Date: 09/25/2022  PCP: none REFERRING PROVIDER: Montez Morita, PA-C    END OF SESSION:           Past Medical History:  Diagnosis Date   Medical history non-contributory    Vitamin D deficiency 05/17/2022   Past Surgical History:  Procedure Laterality Date   FOOT SURGERY Left    JOINT REPLACEMENT Right 2021   ORIF TIBIA PLATEAU Left 05/16/2022   Procedure: OPEN REDUCTION INTERNAL FIXATION (ORIF) TIBIAL PLATEAU;  Surgeon: Myrene Galas, MD;  Location: [facility];  Service: Orthopedics;  Laterality: Left;   TOTAL HIP ARTHROPLASTY Right 07/17/2020   Procedure: RIGHT TOTAL HIP ARTHROPLASTY ANTERIOR APPROACH;  Surgeon: Gean Birchwood, MD;  Location: [facility];  Service: Orthopedics;  Laterality: Right;   TOTAL HIP ARTHROPLASTY Left 12/11/2020   Procedure: LEFT TOTAL HIP ARTHROPLASTY ANTERIOR APPROACH;  Surgeon: Gean Birchwood, MD;  Location: [facility];  Service: Orthopedics;  Laterality: Left;   Patient Active Problem List   Diagnosis Date Noted   Vitamin D deficiency 05/17/2022   Closed fracture of lateral portion of left tibial plateau 05/16/2022   S/P hip replacement, left 12/11/2020   AVN (avascular necrosis of bone) (HCC) 12/06/2020   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  Rationale for Evaluation and Treatment Rehabilitation  PERTINENT HISTORY: ORIF L tibial plateus fx and meniscus repair with Dr Carola Frost 05-16-22 Bil THR due to AVN   PRECAUTIONS:  Yes NWB for 6 weeks from surgery  from Montez Morita secure message. Brace for 2 weeks and then WBAT for 2 weeks in brace before weaning.  Wean from brace after November 28th.   SUBJECTIVE:                                                                                                                                                                                       SUBJECTIVE STATEMENT: More ankle pain than knee pain. I had CT/MRI scans of ankle and knee. I am waiting on the results.   Are you having pain? Yes: NPRS scale: 4/10 Pain location: Lt knee Pain description: ache Aggravating factors: movement Relieving factors: medicine   OBJECTIVE: (objective measures completed at initial evaluation unless otherwise dated)  DIAGNOSTIC FINDINGS:     1-25-24IMPRESSION: Ankle LT 1. Tiny short segment partial-thickness tear of the peroneus brevis tendon at the distal aspect of the fibula. 2. Mild posterior tibial tenosynovitis. 3. Remote high-grade partial-thickness tear of the anterior talofibular ligament.  FINDINGS: Knee LT Bones/Joint/Cartilage   There is a split depressed lateral tibial plateau fracture status post lateral plate fixation. There is a depressed fracture fragment with persistent fracture line along the margins and with marginal sclerosis. Hardware is intact without evidence of loosening. No plate separation. No evidence of acute fracture. Indistinct appearance of the anterior horn and body of the lateral meniscus, and at the posterior horn-body junction, possible torn. Moderate-sized joint effusion with synovial thickening. There is tricompartment osteophyte formation and mild joint space narrowing in the patellofemoral compartment.   Ligaments  Suboptimally assessed by CT.  Muscles and Tendons  No acute myotendinous abnormality by CT.  Soft tissues  Soft tissue swelling along the knee.  No focal fluid collection.   IMPRESSION: Split depressed lateral tibial plateau fracture status post lateral plate and screw fixation. Intact hardware without evidence of loosening. Incomplete healing with persistent fracture line around the depressed fracture fragment with sclerotic margins.   Tricompartment osteoarthritis. Moderate-sized joint effusion with synovial thickening.   Findings  suggesting possible lateral meniscus tear. 05-16-22 There is internal fixation of comminuted fracture in the medial tibial plateau with metallic plate and surgical screws. There is is surgical drain in the soft tissues along the lateral aspect. There is possible minimal effusion in suprapatellar bursa.   IMPRESSION: Status post internal fixation of comminuted fracture of lateral plateau of left tibia.  PATIENT SURVEYS:   LEFS 21/80  26.3% disability   08-15-22  LEFS 56/80  70%   COGNITION: Overall cognitive status: Within functional limits for tasks assessed                         SENSATION: Numbness on top of ankle and down Left shin bone   EDEMA:  Circumferential: Left knee 43 cm  R 39 cm   MUSCLE LENGTH: Hamstrings: Right 74 deg; Left 60 deg     POSTURE: rounded shoulders, forward head, and tall and within normal BMI   PALPATION: TTP over medial knee down into shin and on top of ankle   LOWER EXTREMITY ROM:   Active ROM Right eval Left eval 07/10/22 Left 07/17/22 Left 07-23-22 Left 08-15-22 Left 08/20/22 Left  09-03-22 LT 09-17-22 LT  Hip flexion             Hip extension             Hip abduction             Hip adduction             Hip internal rotation             Hip external rotation             Knee flexion 135/P139 105/P109 104 106 active 110 Active 112/PROM 120 110 117 117  Knee extension 0/+4 hyperext 12deficit/P 10 deficit  -5 active -3 active -3 Active -3 -3 0  Ankle dorsiflexion 9 -11defict/stiff  -20 active supine -17 active supine   -17 active supine -15 supine  Ankle plantarflexion             Ankle inversion             Ankle eversion              (Blank rows = not tested)   LOWER EXTREMITY MMT:   MMT Right eval Left eval RT/LT 08-15-22 LT 08/20/22 LT 09-17-22  Hip flexion 5 5 5/5  5  Hip extension  Hip abduction 5   5/4 4 4   Hip adduction         Hip internal rotation         Hip external rotation         Knee flexion 5  3- 5/4 4 4   Knee extension 5 3-  5/4 4 4+  Ankle dorsiflexion 5   5/4+  4+  Ankle plantarflexion       RT 25/25  LT 25/25   Ankle inversion         Ankle eversion          (Blank rows = not tested)     FUNCTIONAL TESTS:  5 times sit to stand: 17.34 sec and Wt bears to Right  5 x STS  13.30 sec 08/20/22: 5 x STS 13.3 sec    GAIT: Distance walked: 250 ft  Assistive device utilized: Crutches Level of assistance: Complete Independence Comments: Pt able to bear wt in Left LE but wt bears to right.  Pt without brace as per MD order and did not attempt stairs at eval  TODAY'S TREATMENT 12 visits 09/09/22-10/20/22  [facility] Adult PT Treatment:                                                DATE: 09-26-22 Dr Phillis Knack ask for modalities only until seen in clinic   Therapeutic Exercise: *** Manual Therapy: *** Neuromuscular re-ed: *** Therapeutic Activity: *** Modalities: *** Self Care: *** [facility] Adult PT Treatment:                                                DATE: 09/20/22 Therapeutic Exercise: OMEGA knee ext BIL 15 x 2 35 # ,  5# single leg x 10 OMEGA knee flex  BIL 15 x 2 45# Leg press OMEGA 65# double LE  x 15 x 2 Leg press  OMEGA 35# singel LT x 15 x 2 Stand calf stretch on slant board 2 x 30 sec Standing quad stretch with LLE foot in chair 2 x 30 sec QS SLR Hip abduction    [facility] Adult PT Treatment:                                                DATE: 09-17-22 Therapeutic Exercise: Wall sit  10 x 10 sec hold Peroneal GTB 3 x 10 LT Monster walk side stepping with GTB around ankles 20 feet with pain in LT knee and then 3 x 20 feet with GTB around knees with decreased pain.  Stand calf stretch on slant board 2 x 30 sec Leg press OMEGA 55# double LE  x 15 x 2 Leg press  OMEGA 35# singel LT x 15 x 2 OMEGA knee ext BIL 15 x 2 35 #  attempted single limb but pain so declined after 2 x OMEGA knee flex  BIL 15 x 2 35# Manual Therapy: STW over lateral knee and lateral  ankle over peroneals LT   [facility] Adult PT Treatment:  DATE: 09-03-22 Therapeutic Exercise: Nu-step L 7 x 7 min LE only with cue to keep steady pace ( goal for endurance training) Stand calf stretch on slant board 2 x 30 sec Standing quad stretch with LLE foot in chair 2 x 30 sec Standing hip abduction 2 x 20 with GTB Standing hip extension 2 x 20 with GTB Standing LT KB DF and march with 10 lb 2 x 10 Step up 2 x 20 LLE only on 6 inch step Standing marching with GTB around foot requiring DF 2 x 20 alternating L/R Leg press OMEGA 40# double LE  x 15 x 2 Leg press  OMEGA 35# singel LT x 15 x 2    [facility] Adult PT Treatment:                                                DATE: 08/29/2022 Therapeutic Exercise: Nu-step L 7 x 7 min LE only with cue to keep steady pace ( goal for endurance training) Stand calf stretch on slant board 2 x 30 sec Standing quad stretch with LLE foot in chair 2 x 30 sec Standing hip abduction 2 x 20 with RTB Step up 2 x 20 LLE only on 6 inch step Standing marching with RTB around foot requiring DF 2 x 20 alternating L/R Mini wall squat 1 x 5, progressed with moving feet out 6 inch 1 x 5, 3rd set keeping foot position and performing 1 x 5 holding 5 sec ea.     [facility] Adult PT Treatment:                                                DATE: 08/20/22 Therapeutic Exercise: Rec Bike L3 x 6 minutes  Standing heel raises 10 x 2  Left gastroc stretch x 3 for 30 sec  Horizontal Leg Press :40# bil  Leg press 20# single LE  x 10 Knee ext OMEGA 10# bil x 20  Knee ext OMEGA 5# bil to Left eccentric - 1/2 ROM  Left LAQ 5 # x 15  Seated OMEGA Knee flexion 35# blat with eccentric focus  QS into towel  SLR with initial QS.  STS x 10- cues for equal weight bearing    [facility] Adult PT Treatment:                                                DATE: 08-15-22  Therapeutic Exercise: Recumbent bike L5 x 5 min - seat lowered at 2:30 sec to  promote knee flexion Slant board calf stretch 2 x 30  Leg press (cybex)1 x 15 25 #,  2 x 10 35# LLE only Between leg press sets performed 2 x 20 heel raised 25# Seated knee extension 3 x 10 with 15#  with BIL concentric and eccentric lowering LT Seated hamstring curl 2 x 10 with 35# 1 set bil LE, 2nd set con bil/ ecc L only     PATIENT EDUCATION:  Education details: continue HEP Person educated: Patient Education method: Explanation Education comprehension: verbalized understanding   HOME EXERCISE PROGRAM:  Access Code: [email or web address] URL: [email or web address] Date: 08/29/2022 Prepared by: Lulu Riding  Exercises - Supine active Straight Leg Raise in Brace until July 16, 2022  - 1 x daily - 7 x weekly - 3 sets - 10 reps - Long Sitting Quad Set with Towel Roll Under Heel  - 1 x daily - 7 x weekly - 3 sets - 10 reps - Supine Heel Slide  - 1 x daily - 7 x weekly - 3 sets - 10 reps - Seated Calf Stretch with Strap  - 1 x daily - 7 x weekly - 3 sets - 3 reps - 30-60-sec hold - Standing Gastroc Stretch  - 1 x daily - 7 x weekly - 1 sets - 10 reps - 5-10 hold - Mini Squat with Counter Support  - 1 x daily - 7 x weekly - 2 sets - 10 reps - Sidelying Hip Abduction  - 1 x daily - 7 x weekly - 2 sets - 10 reps - Step Up  - 1 x daily - 7 x weekly - 3 sets - 10 reps - Lateral Step Up  - 1 x daily - 7 x weekly - 3 sets - 10 reps - Standing Terminal Knee Extension with Resistance  - 1 x daily - 7 x weekly - 3 sets - 10 reps - Standing Knee Flexion  - 1 x daily - 7 x weekly - 2 sets - 10 reps - Seated Heel Slide  - 1 x daily - 7 x weekly - 2 sets - 10 reps - Standing Hip Abduction with Resistance at Ankles and Counter Support  - 1 x daily - 7 x weekly - 3 sets - 20 reps - Marching with Resistance  - 1 x daily - 7 x weekly - 3 sets - 20 reps - Wall Squat  - 1 x daily - 7 x weekly - 2 sets - 10 reps - hold 5 seconds hold   ASSESSMENT:   CLINICAL IMPRESSION: Pt arrives  reporting 4/10 knee pain and states that his ankle hurts worse than his knee. He is awaiting results of MRI and CT scans. Continued with previous course of strengthening using gym machines. Continued with quad activation and knee extension. Min Quad leg present with SLR.      OBJECTIVE IMPAIRMENTS: difficulty walking, decreased ROM, decreased strength, increased edema, and pain.    ACTIVITY LIMITATIONS: standing, squatting, transfers, bathing, dressing, and hygiene/grooming   PARTICIPATION LIMITATIONS: community activity, occupation, and return to exercise and recreational golf /hiking   PERSONAL FACTORS: 1 comorbidity: BIL THA for AVN  are also affecting patient's functional outcome.    REHAB POTENTIAL: Excellent   CLINICAL DECISION MAKING: Evolving/moderate complexity   EVALUATION COMPLEXITY: Moderate     GOALS: Goals reviewed with patient? Yes   SHORT TERM GOALS: Target date: 07-23-22  Pt will be independent with initial HEP Baseline: no knowledge Status: 07/17/22 : independent Goal status: MET   2.  Pt will return to bed to sleep rather than sleeping in recliner Baseline: eval must sleep in recliner Status: 07/17/22: has slept in bed 2 times.  08/20/22: sleeping in bed Goal status: MET   3.  Pain will decrease to 4/10 at worst/standing bending knee Baseline: eval pain up to 8/10 Status: 07/17/22: 7/10  07-23-22  7/10 08/20/22: 6-7/10 with ROM exercises and end of day 09-03-22 4/10 with ROM exercises and enf of day Goal status: MET   4.  L knee AROM flexion will improve  to 5-115 degrees for improved mobility to ride in car without increased pain Baseline: See AROM chart Status: 07/17/22: 5-106 degrees Status 08/20/22  3-110 degrees Status 09-03-22 3-117 Goal status: MET   5.  Pt will be able to stand with even wt distribution in bil LE Baseline: Pt wt bears to right Status: 07/17/22: continues to bear wt to RLE, needs cues  1/2/-24 needs cues 09/20/22: needs cues  Goal  status: partially met   6.  Pt with DF of L will be at 0 neutral             Baseline  -11 from neutral  Status: -20 from neutral   Status -17 from neutral 07/23/22            Goal Status: ONGOING     LONG TERM GOALS: Target date: 10/01/2022 revised 10-15-22     Pt will be independent with advanced HEP Baseline: No knowledge 09-03-22  Pt given advanced HEP to reinforce Goal status: ONGOING   2.  Pt will be able to walk 2 miles for exercise Baseline: unable to stand for greater than 10 min 08/20/22: can walk 30 minutes  09-03-22  walks one mile and is pursuing 2 miles Goal status: ONGOING   3.  Pt will be able to get back to recreational pursuits, hiking , golf and kayaking Baseline: unable to participate due to knee surgery 08/20/22: unable to participate , working on walking 09-03-22 have not tried hiking , golf or kayaking due to weather Goal status: ONGOING   4.  PT with be able to walk/stand >/= 1 hour with no AD with </= 2/10 pain for functional endurance and return to leisure activities  Baseline: Pt unable to stand for greater than 10 min with 6-8/10 pain 08/20/22: can walk for 30 minutes, has increased pain end of day to 6-7/10 Goal status: ONGOING   5.  Pt will be able to negotiate steps without any AD and with safe execution Baseline: unable at eval due to not having brace12-28-23  Pt negotiating steps daily with 5/10 pain at apartment 08/20/22: able to negotiate steps independently  with HR and compensations 09-03-22  Pt reports 50% better on steps. Goal status: PARTIALLY MET   6.  Patient will report improved functional level on LEFS >/= 70/80 in order to allow for return to prior exercise level Baseline: LEFS 21/80 26.3 % disability  08-15-22 70.0% Goal status: MET     PLAN:   PT FREQUENCY: 2x/week   PT DURATION: 10/15/2022   PLANNED INTERVENTIONS: Therapeutic exercises, Therapeutic activity, Neuromuscular re-education, Balance training, Gait training,  Patient/Family education, Self Care, Joint mobilization, Stair training, DME instructions, Dry Needling, Electrical stimulation, Cryotherapy, Moist heat, scar mobilization, Taping, Manual therapy, and Re-evaluation   PLAN FOR NEXT SESSION: Review HEP,  knee ROM and strengthening; gait training. Instructed to bring shoes to work on progressive strengthening.  Check MRI/ new info on knee pain    Hessie Diener, PTA 09/25/22 4:38 PM Phone: [phone] Fax: [phone]

## 2022-09-26 ENCOUNTER — Ambulatory Visit: Payer: Medicaid Other | Admitting: Physical Therapy

## 2022-09-30 ENCOUNTER — Ambulatory Visit: Payer: Medicaid Other | Admitting: Physical Therapy

## 2022-10-04 ENCOUNTER — Ambulatory Visit: Payer: Medicaid Other | Admitting: Physical Therapy

## 2022-10-07 ENCOUNTER — Encounter: Payer: Medicaid Other | Admitting: Physical Therapy

## 2022-10-10 ENCOUNTER — Telehealth: Payer: Self-pay | Admitting: Physical Therapy

## 2022-10-10 ENCOUNTER — Ambulatory Visit: Payer: Medicaid Other | Admitting: Physical Therapy
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
## 2022-10-15 ENCOUNTER — Ambulatory Visit: Payer: Medicaid Other | Admitting: Physical Therapy

## 2022-12-13 IMAGING — RF DG HIP (WITH PELVIS) OPERATIVE*L*
1 series · 2 of 2 positions shown · non-contrast
Comparison: None.

CLINICAL DATA: Elective surgery.

EXAM:
OPERATIVE LEFT HIP (WITH PELVIS IF PERFORMED)
TECHNIQUE: Fluoroscopic spot image(s) were submitted for interpretation
post-operatively.

[Series 1: unknown protocol · 0.20mm/px · 2 of 2 slices shown]
[im 1/2]
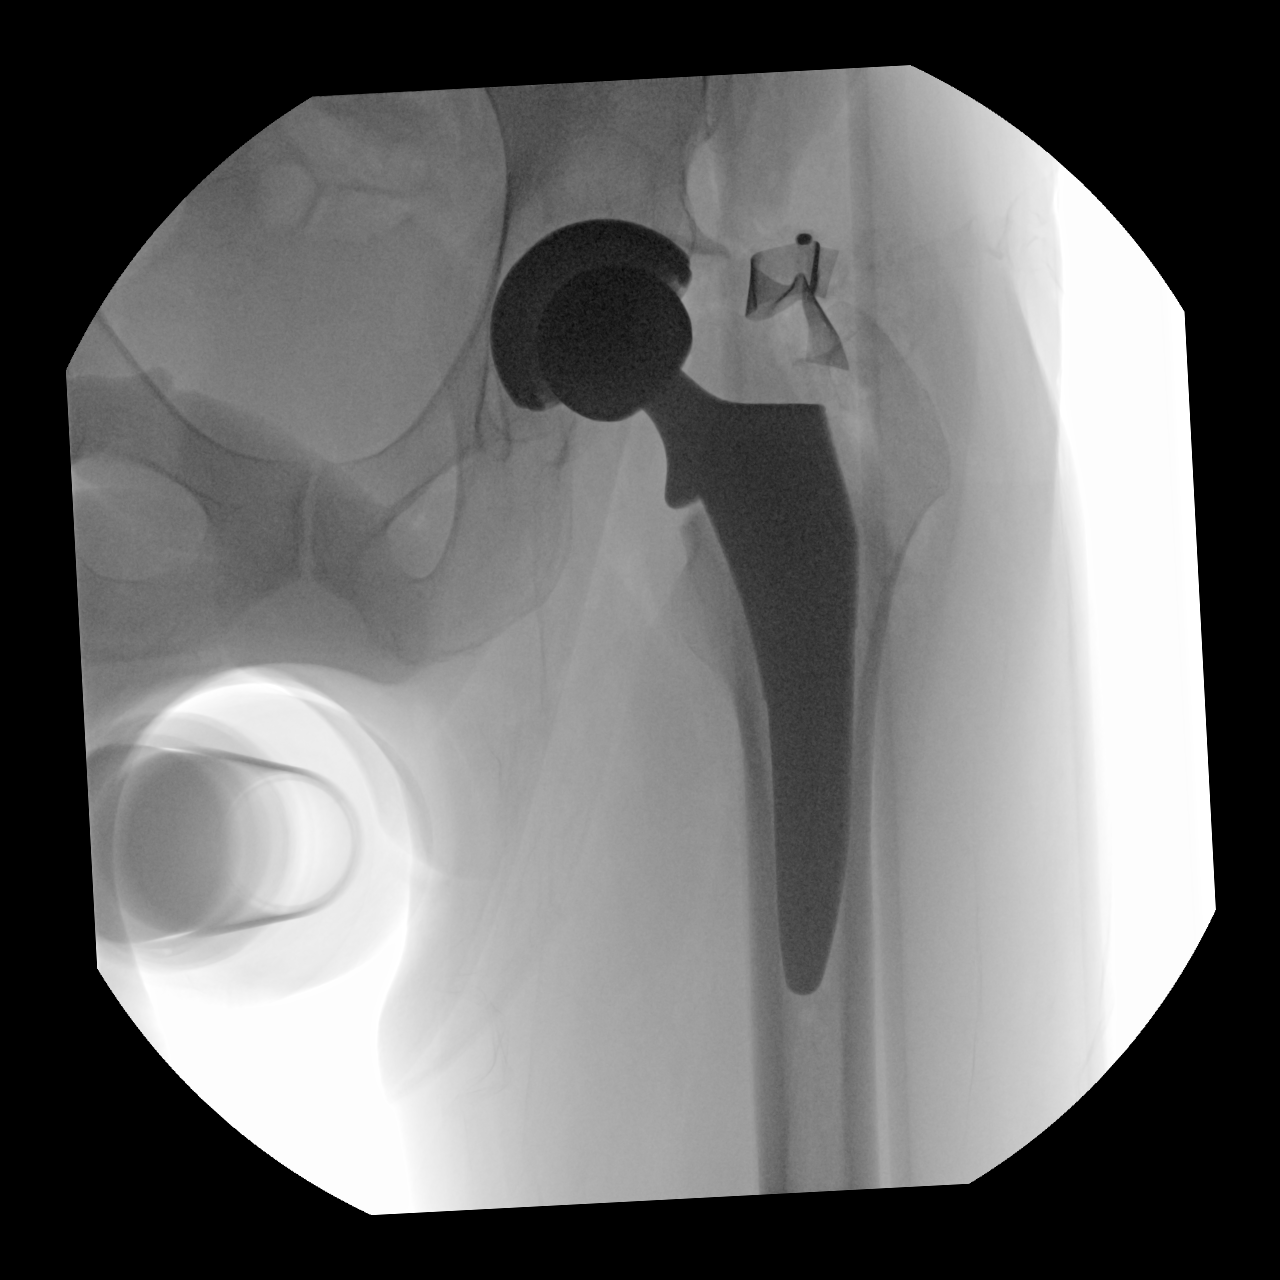
[im 2/2]
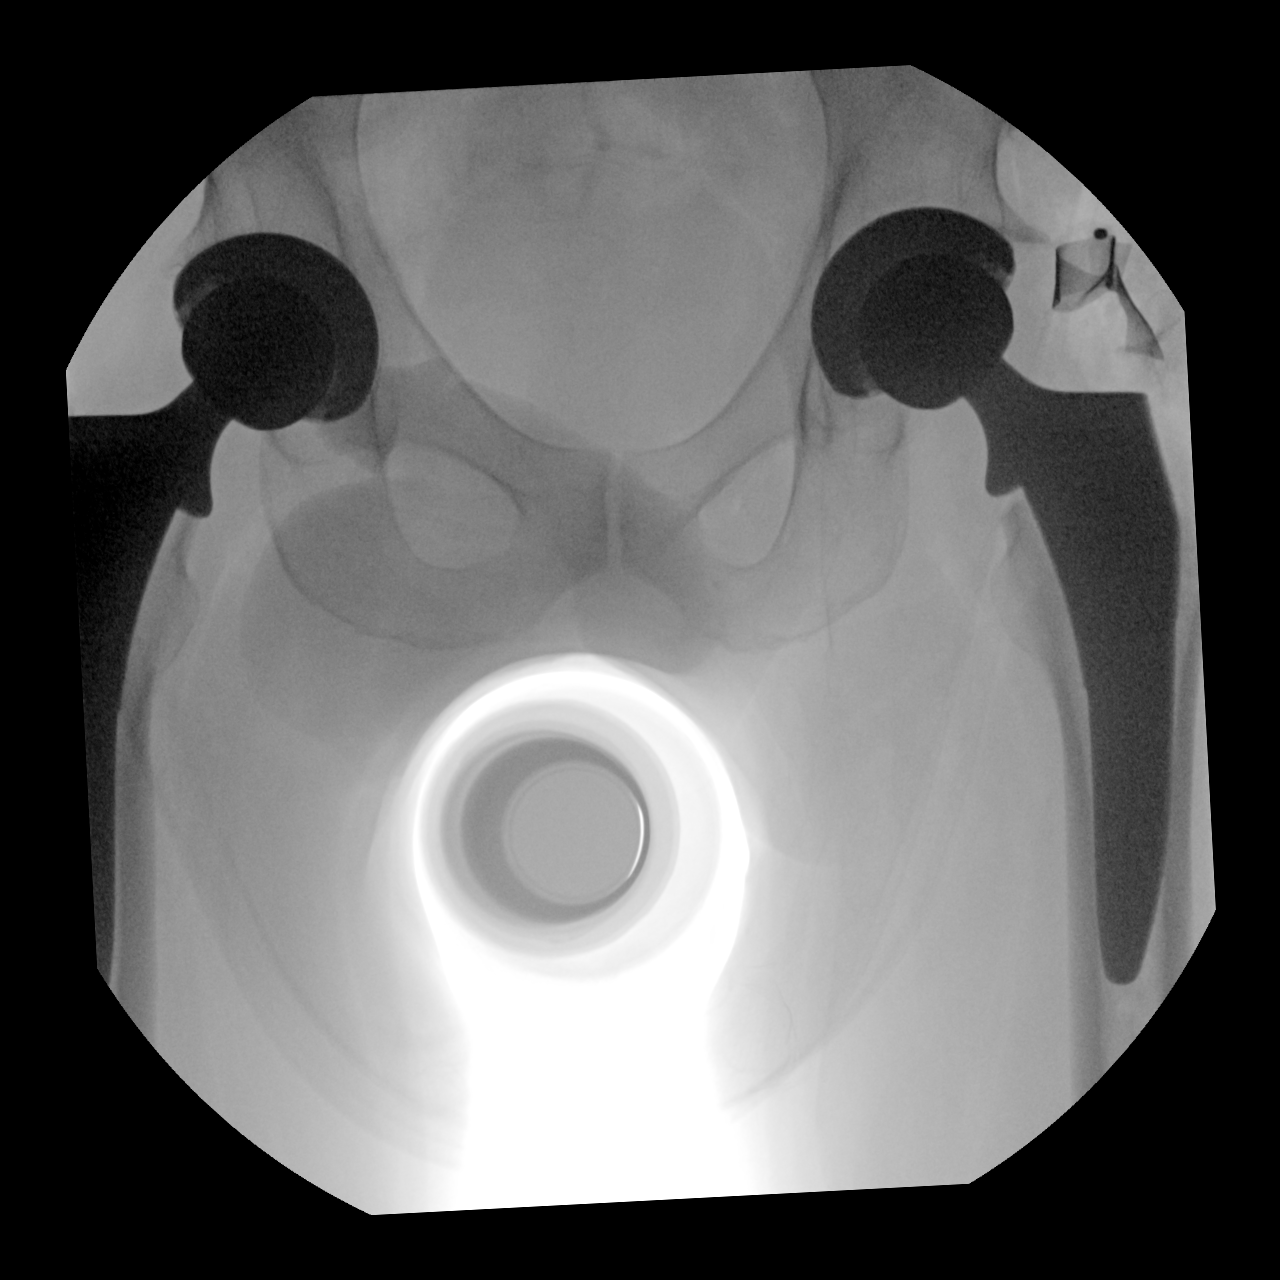

[2 of 2 positions shown; findings below may reference images not displayed]

FINDINGS: Two fluoroscopic spot views of the pelvis and left hip obtained in
the operating room in frontal projection. Left hip arthroplasty in
expected alignment. Total fluoroscopy time 8 seconds.
IMPRESSION: Procedural fluoroscopy for left hip arthroplasty.

## 2023-01-07 LAB — COLOGUARD: COLOGUARD: NEGATIVE

## 2023-04-16 ENCOUNTER — Emergency Department (HOSPITAL_COMMUNITY): Payer: MEDICAID

## 2023-04-16 ENCOUNTER — Emergency Department (HOSPITAL_COMMUNITY)
Admission: EM | Admit: 2023-04-16 | Discharge: 2023-04-16 | Disposition: A | Discharge status: Home / Self Care | Payer: MEDICAID | Attending: Emergency Medicine | Admitting: Emergency Medicine

## 2023-04-16 ENCOUNTER — Other Ambulatory Visit: Payer: Self-pay

## 2023-04-16 ENCOUNTER — Encounter (HOSPITAL_COMMUNITY): Payer: Self-pay | Admitting: Orthopedic Surgery

## 2023-04-16 ENCOUNTER — Encounter (HOSPITAL_COMMUNITY): Payer: Self-pay

## 2023-04-16 DIAGNOSIS — M25562 Pain in left knee: Secondary | ICD-10-CM | POA: Insufficient documentation

## 2023-04-16 DIAGNOSIS — Z7901 Long term (current) use of anticoagulants: Secondary | ICD-10-CM | POA: Insufficient documentation

## 2023-04-16 DIAGNOSIS — T84117A Breakdown (mechanical) of internal fixation device of bone of left lower leg, initial encounter: Secondary | ICD-10-CM | POA: Diagnosis not present

## 2023-04-16 DIAGNOSIS — T84498A Other mechanical complication of other internal orthopedic devices, implants and grafts, initial encounter: Secondary | ICD-10-CM

## 2023-04-16 MED ORDER — OXYCODONE-ACETAMINOPHEN 5-325 MG PO TABS
2.0000 | ORAL_TABLET | Freq: Once | ORAL | Status: AC
  Administered 2023-04-16: 2 via ORAL
  Filled 2023-04-16: qty 2
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
## 2023-04-16 NOTE — Anesthesia Preprocedure Evaluation (Signed)
Anesthesia Evaluation  Patient identified by MRN, date of birth, ID band Patient awake    Reviewed: Allergy & Precautions, NPO status , Patient's Chart, lab work & pertinent test results  History of Anesthesia Complications Negative for: history of anesthetic complications  Airway Mallampati: II  TM Distance: >3 FB Neck ROM: Full    Dental  (+) Missing, Dental Advisory Given   Pulmonary Patient abstained from smoking.   breath sounds clear to auscultation       Cardiovascular negative cardio ROS  Rhythm:Regular Rate:Normal     Neuro/Psych negative neurological ROS     GI/Hepatic negative GI ROS,,,(+)     substance abuse  marijuana use  Endo/Other  negative endocrine ROS    Renal/GU negative Renal ROS     Musculoskeletal   Abdominal   Peds  Hematology negative hematology ROS (+)   Anesthesia Other Findings   Reproductive/Obstetrics                             Anesthesia Physical Anesthesia Plan  ASA: 2  Anesthesia Plan: General   Post-op Pain Management:    Induction: Intravenous  PONV Risk Score and Plan: 2 and Ondansetron, Midazolam and Treatment may vary due to age or medical condition  Airway Management Planned: Oral ETT  Additional Equipment: None  Intra-op Plan:   Post-operative Plan: Extubation in OR  Informed Consent: I have reviewed the patients History and Physical, chart, labs and discussed the procedure including the risks, benefits and alternatives for the proposed anesthesia with the patient or authorized representative who has indicated his/her understanding and acceptance.     Dental advisory given  Plan Discussed with: CRNA and Surgeon  Anesthesia Plan Comments: (PAT note written 04/16/2023 by Shonna Chock, PA-C.  )        Anesthesia Quick Evaluation

## 2023-04-16 NOTE — ED Provider Notes (Signed)
[place] EMERGENCY DEPARTMENT AT [facility] Provider Note   CSN: [id number] Arrival date & time: 04/16/23  7829     History  Chief Complaint  Patient presents with   Knee Pain    Keith Alvarado is a 47 y.o. male.  47 year old male presents to the emergency department for evaluation of left knee pain. Hx of ORIF of tibial plateau fx in Sept 2023 by Dr. Carola Frost. States that yesterday he began to feel some mild discomfort in his L lateral knee 2 days ago. This worsened yesterday AM and throughout the day patient began to notice some progressive protrusion from his knee. Presenting for ongoing pain. Denies recent strenuous activity, fall, or trauma.   Does have appt with Dr. Carola Frost at 10:30am today.  The history is provided by the patient. No language interpreter was used.  Knee Pain      Home Medications Prior to Admission medications   Medication Sig Start Date End Date Taking? Authorizing Provider  apixaban (ELIQUIS) 2.5 MG TABS tablet Take 1 tablet (2.5 mg total) by mouth 2 (two) times daily. 05/17/22 06/16/22  Montez Morita, PA-C  ascorbic acid (VITAMIN C) 1000 MG tablet Take 1 tablet (1,000 mg total) by mouth daily. 05/18/22   Montez Morita, PA-C  b complex vitamins capsule Take 1 capsule by mouth 3 (three) times a week.    [provider]  calcium citrate (CALCITRATE - DOSED IN MG ELEMENTAL CALCIUM) 950 (200 Ca) MG tablet Take 1 tablet (200 mg of elemental calcium total) by mouth 2 (two) times daily. 05/17/22 07/16/22  Montez Morita, PA-C  Cholecalciferol 125 MCG (5000 UT) TABS Take 1 tablet by mouth daily. 05/17/22   Montez Morita, PA-C  docusate sodium (COLACE) 100 MG capsule Take 1 capsule (100 mg total) by mouth 2 (two) times daily. 05/17/22   Montez Morita, PA-C  doxycycline (VIBRA-TABS) 100 MG tablet Take 100 mg by mouth 2 (two) times daily. 05/07/22   [provider]  ketorolac (TORADOL) 10 MG tablet Take 1 tablet (10 mg total) by mouth every 6  (six) hours as needed for moderate pain. 05/17/22   Montez Morita, PA-C  methocarbamol (ROBAXIN) 500 MG tablet Take 1-2 tablets (500-1,000 mg total) by mouth every 6 (six) hours as needed for muscle spasms. 05/17/22   Montez Morita, PA-C  mupirocin ointment (BACTROBAN) 2 % Apply 1 Application topically 3 (three) times daily as needed for wound care. 05/07/22   [provider]  Omega-3 Fatty Acids (OMEGA 3 PO) Take 2 capsules by mouth 3 (three) times a week.    [provider]  OVER THE COUNTER MEDICATION Take 2 capsules by mouth daily. [employer], Historical, MD  oxyCODONE-acetaminophen (PERCOCET) 10-325 MG tablet Take 1 tablet by mouth every 8 (eight) hours as needed for pain. 05/07/22   [provider]  Vitamin D, Ergocalciferol, (DRISDOL) 1.25 MG (50000 UNIT) CAPS capsule Take 1 capsule (50,000 Units total) by mouth every 7 (seven) days. 05/23/22   Montez Morita, PA-C  Zinc Sulfate 220 (50 Zn) MG TABS Take 1 tablet (220 mg total) by mouth daily. 05/18/22   Montez Morita, PA-C      Allergies    Patient has no known allergies.    Review of Systems   Review of Systems Ten systems reviewed and are negative for acute change, except as noted in the HPI.    Physical Exam Updated Vital Signs BP (!) 141/96   Pulse (!) 102  Temp 99 F (37.2 C)   Resp 16   SpO2 95%   Physical Exam Vitals and nursing note reviewed.  Constitutional:      General: He is not in acute distress.    Appearance: He is well-developed. He is not diaphoretic.  HENT:     Head: Normocephalic and atraumatic.  Eyes:     General: No scleral icterus.    Conjunctiva/sclera: Conjunctivae normal.  Cardiovascular:     Rate and Rhythm: Normal rate and regular rhythm.     Pulses: Normal pulses.     Comments: DP pulse 2+ in the LLE Pulmonary:     Effort: Pulmonary effort is normal. No respiratory distress.  Musculoskeletal:        General: Swelling present.     Cervical back: Normal range of  motion.     Comments: Mild swelling of the L knee with protrusion from left lateral skin. No erythema, heat to touch.  Skin:    General: Skin is warm and dry.     Coloration: Skin is not pale.     Findings: No erythema or rash.  Neurological:     Mental Status: He is alert and oriented to person, place, and time.     Coordination: Coordination normal.  Psychiatric:        Behavior: Behavior normal.         ED Results / Procedures / Treatments   Labs (all labs ordered are listed, but only abnormal results are displayed) Labs Reviewed - No data to display  EKG None  Radiology DG Knee Complete 4 Views Left  Result Date: 04/16/2023 CLINICAL DATA:  Knee pain EXAM: LEFT KNEE - COMPLETE 4+ VIEW COMPARISON:  05/16/22 FINDINGS: One of the screws at the lateral surface of the proximal tibia has backed out approximately 3 cm from the plate. The remainder of the hardware is unremarkable. No acute fracture. IMPRESSION: One of the screws at the lateral surface of the proximal tibia has backed out approximately 3 cm from the plate. Electronically Signed   By: Deatra Robinson M.D.   On: 04/16/2023 03:22    Procedures Procedures    Medications Ordered in ED Medications  oxyCODONE-acetaminophen (PERCOCET/ROXICET) 5-325 MG per tablet 2 tablet (2 tablets Oral Given 04/16/23 0437)    ED Course/ Medical Decision Making/ A&P Clinical Course as of 04/16/23 0534  Wed Apr 16, 2023  0409 Consult ordered for Orthopedics, on call for Dr. Carola Frost. [KH]  0444 Ortho repaged. [KH]  1610 Spoke with Orthopedics on call for Dr. Carola Frost. Recommends knee immobilizer, crutches, continued f/u in clinic today as scheduled. [KH]  [phone] Plan discussed w/patient who verbalizes understanding. Seeing that patient already with clinic f/u today, will defer Rx for pain medications to his Orthopedic provider. [KH]    Clinical Course User Index [KH] Antony Madura, PA-C                                 Medical Decision  Making Amount and/or Complexity of Data Reviewed Radiology: ordered.  Risk Prescription drug management.   This patient presents to the ED for concern of L knee pain, this involves an extensive number of treatment options, and is a complaint that carries with it a high risk of complications and morbidity.  The differential diagnosis includes complication of hardware vs fracture vs sprain/strain vs joint dislocation vs septic joint.   Co morbidities that complicate the patient evaluation  OSA   Additional history obtained:  Additional history obtained from spouse, at bedside External records from outside source obtained and reviewed including operative note from September 2023 at time of L tibial ORIF   Imaging Studies ordered:  I ordered imaging studies including Xray L knee  I independently visualized and interpreted imaging which showed retraction of ORIF screw approximately 3cm from the plate. I agree with the radiologist interpretation   Cardiac Monitoring:  The patient was maintained on a cardiac monitor.  I personally viewed and interpreted the cardiac monitored which showed an underlying rhythm of: NSR   Medicines ordered and prescription drug management:  I ordered medication including Percocet for pain  Reevaluation of the patient after these medicines showed that the patient improved I have reviewed the patients home medicines and have made adjustments as needed   Consultations Obtained:  I requested consultation with Orthopedics and discussed lab and imaging findings as well as pertinent plan - they recommend: knee immobilizer and continued clinic f/u today as scheduled.   Problem List / ED Course:  As above   Reevaluation:  After the interventions noted above, I reevaluated the patient and found that they have : remained stable   Dispostion:  After consideration of the diagnostic results and the patients response to treatment, I feel that the  patent would benefit from outpatient Orthopedic follow up. Instructed to maintain knee immobilizer and to use crutches to remain NWB. Return precautions discussed and provided. Patient discharged in stable condition with no unaddressed concerns.          Final Clinical Impression(s) / ED Diagnoses Final diagnoses:  Mechanical complication of internal fixation device such as nail, plate or rod, initial encounter [facility])    Rx / DC Orders ED Discharge Orders     None         Antony Madura, PA-C 04/16/23 0539    Zadie Rhine, MD 04/16/23 [phone]

## 2023-04-16 NOTE — Progress Notes (Signed)
PCP - [facility] Cardiologist - denies  PPM/ICD - denies Device Orders - n/a Rep Notified - n/a  Chest x-ray - 12-11-20 EKG - 07-05-20 Stress Test - denies ECHO - denies Cardiac Cath - denies  CPAP - denies  DM- Denies  Blood Thinner Instructions: denies Aspirin Instructions: n/a  ERAS Protcol - ERAS no drink  COVID TEST- n/a  Anesthesia review: yes recent ed visit  Patient verbally denies any shortness of breath, fever, cough and chest pain during phone call   -------------  SDW INSTRUCTIONS given:  Your procedure is scheduled on August 29,2024.  Report to [facility] Main Entrance "A" at 5:30 A.M., and check in at the Admitting office.  Call this number if you have problems the morning of surgery:  [phone]   Remember:  Do not eat after midnight the night before your surgery  You may drink clear liquids until 5:00 a.m. the morning of your surgery.   Clear liquids allowed are: Water, Non-Citrus Juices (without pulp), Carbonated Beverages, Clear Tea, Black Coffee Only, and Gatorade    Take these medicines the morning of surgery with A SIP OF WATER   IF needed  acetaminophen (TYLENOL)   As of today, STOP taking any Aspirin (unless otherwise instructed by your surgeon) Aleve, Naproxen, Ibuprofen, Motrin, Advil, Goody's, BC's, all herbal medications, fish oil, and all vitamins.                      Do not wear jewelry, make up, or nail polish            Do not wear lotions, powders, perfumes/colognes, or deodorant.            Do not shave 48 hours prior to surgery.  Men may shave face and neck.            Do not bring valuables to the hospital.            [facility] is not responsible for any belongings or valuables.  Do NOT Smoke (Tobacco/Vaping) 24 hours prior to your procedure If you use a CPAP at night, you may bring all equipment for your overnight stay.   Contacts, glasses, dentures or bridgework may not be worn into  surgery.      For patients admitted to the hospital, discharge time will be determined by your treatment team.   Patients discharged the day of surgery will not be allowed to drive home, and someone needs to stay with them for 24 hours.    Special instructions:   [facility]- Preparing For Surgery  Before surgery, you can play an important role. Because skin is not sterile, your skin needs to be as free of germs as possible. You can reduce the number of germs on your skin by washing with CHG (chlorahexidine gluconate) Soap before surgery.  CHG is an antiseptic cleaner which kills germs and bonds with the skin to continue killing germs even after washing.    Oral Hygiene is also important to reduce your risk of infection.  Remember - BRUSH YOUR TEETH THE MORNING OF SURGERY WITH YOUR REGULAR TOOTHPASTE  Please do not use if you have an allergy to CHG or antibacterial soaps. If your skin becomes reddened/irritated stop using the CHG.  Do not shave (including legs and underarms) for at least 48 hours prior to first CHG shower. It is OK to shave your face.  Please follow these instructions carefully.   Shower  the NIGHT BEFORE SURGERY and the MORNING OF SURGERY with DIAL Soap.   Pat yourself dry with a CLEAN TOWEL.  Wear CLEAN PAJAMAS to bed the night before surgery  Place CLEAN SHEETS on your bed the night of your first shower and DO NOT SLEEP WITH PETS.   Day of Surgery: Please shower morning of surgery  Wear Clean/Comfortable clothing the morning of surgery Do not apply any deodorants/lotions.   Remember to brush your teeth WITH YOUR REGULAR TOOTHPASTE.   Questions were answered. Patient verbalized understanding of instructions.

## 2023-04-16 NOTE — Discharge Instructions (Signed)
Wear a knee immobilizer at all times to prevent movement of your left knee.  Use crutches to prevent from putting weight on your left leg while walking.  Follow-up with Dr. Carola Frost in the clinic today as scheduled.  You may return for new or concerning symptoms.

## 2023-04-16 NOTE — ED Notes (Signed)
Assumed care of patient here c/o left knee pain. Patient has screw protruding at point of pain per xray. Patient denies new injury fall trauma to area. Patient has full rom but it is painful to walk. Patient a/o x 4 respirations even and non labored .

## 2023-04-16 NOTE — ED Notes (Signed)
Patient medicated per provider orders and made aware of wait for ortho sx to come evaluate his knee

## 2023-04-16 NOTE — H&P (Signed)
Orthopaedic Trauma Service (OTS) Consult   Patient ID: Keith Alvarado MRN: [id number] DOB/AGE: 1976-05-27 47 y.o.    HPI: Keith Alvarado is an 47 y.o. male s/p ORIF L tibial plateau 04/2022. Presents today for removal of hardware due to a screw backing out and for eventual TKA if he becomes symptomatic. Risks and benefits of surgery reviewed with pt and he wishes to proceed   Past Medical History:  Diagnosis Date   Medical history non-contributory    Vitamin D deficiency 05/17/2022    Past Surgical History:  Procedure Laterality Date   FOOT SURGERY Left    JOINT REPLACEMENT Right 2021   ORIF TIBIA PLATEAU Left 05/16/2022   Procedure: OPEN REDUCTION INTERNAL FIXATION (ORIF) TIBIAL PLATEAU;  Surgeon: Myrene Galas, MD;  Location: [facility] OR;  Service: Orthopedics;  Laterality: Left;   TOTAL HIP ARTHROPLASTY Right 07/17/2020   Procedure: RIGHT TOTAL HIP ARTHROPLASTY ANTERIOR APPROACH;  Surgeon: Gean Birchwood, MD;  Location: [facility] ORS;  Service: Orthopedics;  Laterality: Right;   TOTAL HIP ARTHROPLASTY Left 12/11/2020   Procedure: LEFT TOTAL HIP ARTHROPLASTY ANTERIOR APPROACH;  Surgeon: Gean Birchwood, MD;  Location: [facility] ORS;  Service: Orthopedics;  Laterality: Left;    Family History  Problem Relation Age of Onset   Healthy Mother    Healthy Father     Social History:  reports that he has never smoked. He has never used smokeless tobacco. He reports current alcohol use of about 6.0 - 8.0 standard drinks of alcohol per week. He reports current drug use. Drug: Marijuana.  Allergies: No Known Allergies  Medications:   Current Outpatient Medications  Medication Instructions   acetaminophen (TYLENOL) 500-1,000 mg, Oral, Every 6 hours PRN   aspirin-sod bicarb-citric acid (ALKA-SELTZER) 325 MG TBEF tablet 650 mg, Oral, Every 6 hours PRN   bismuth subsalicylate (PEPTO BISMOL) 262 MG/15ML suspension 30 mLs, Oral, Every 6 hours PRN   calcium carbonate (TUMS - DOSED  IN MG ELEMENTAL CALCIUM) 500 MG chewable tablet 2 tablets, Oral, Daily PRN   calcium citrate (CALCITRATE - DOSED IN MG ELEMENTAL CALCIUM) 950 (200 Ca) MG tablet 200 mg of elemental calcium, Oral, 2 times daily   Cholecalciferol 125 MCG (5000 UT) TABS Take 1 tablet by mouth daily.   hydrOXYzine (ATARAX) 25 mg, Oral, At bedtime PRN   vitamin C 1,000 mg, Oral, Daily   Vitamin D (Ergocalciferol) (DRISDOL) 50,000 Units, Oral, Every 7 days   Zinc Sulfate 220 mg, Oral, Daily     No results found for this or any previous visit (from the past 48 hour(s)).  DG Knee Complete 4 Views Left  Result Date: 04/16/2023 CLINICAL DATA:  Knee pain EXAM: LEFT KNEE - COMPLETE 4+ VIEW COMPARISON:  05/16/22 FINDINGS: One of the screws at the lateral surface of the proximal tibia has backed out approximately 3 cm from the plate. The remainder of the hardware is unremarkable. No acute fracture. IMPRESSION: One of the screws at the lateral surface of the proximal tibia has backed out approximately 3 cm from the plate. Electronically Signed   By: Deatra Robinson M.D.   On: 04/16/2023 03:22    Intake/Output    None      Review of Systems  Constitutional:  Negative for chills and fever.  Eyes:  Negative for blurred vision.  Respiratory:  Negative for shortness of breath.   Cardiovascular:  Negative for chest pain.  Gastrointestinal:  Negative for nausea and vomiting.  Musculoskeletal:  Positive for joint pain (L knee pain).  Neurological:  Negative for tingling.   There were no vitals taken for this visit. Physical Exam Constitutional:      General: He is not in acute distress.    Appearance: He is normal weight.  HENT:     Head: Normocephalic.  Eyes:     Extraocular Movements: Extraocular movements intact.  Cardiovascular:     Rate and Rhythm: Normal rate and regular rhythm.     Pulses: Normal pulses.  Pulmonary:     Effort: Pulmonary effort is normal.  Musculoskeletal:     Comments: Left Lower extremity               Surgical wounds well healed             Screws prominent along tibial plateau             Skin is mobile, no pressure wounds   Extremity is warm  No DCT  Compartments are soft  No pain out of proportion with passive stretching of his toes or ankle  DPN, SPN, TN sensory functions are intact  EHL, FHL, lesser toe motor functions intact  Ankle flexion, extension, inversion eversion intact  + DP pulse   Skin:    General: Skin is warm.     Capillary Refill: Capillary refill takes less than 2 seconds.  Neurological:     General: No focal deficit present.     Mental Status: He is alert and oriented to person, place, and time. Mental status is at baseline.  Psychiatric:        Mood and Affect: Mood normal.        Behavior: Behavior normal.        Thought Content: Thought content normal.        Judgment: Judgment normal.     Assessment/Plan:  47 y/o male with symptomatic hardware L tibial plateau   -symptomatic hardware left tibia   OR for [facility]  Outpt procedure  Risks and benefits reviewed with pt and he wishes to proceed  No restrictions post op     - Pain management:  Multimodal   Short course (<7 days) narcotics    Mearl Latin, PA-C [phone] (C) 04/16/2023, 9:33 PM  [facility] [address] [place] [address] [phone] [phone] (F)    After 5pm and on the weekends please log on to Amion, go to orthopaedics and the look under the [facility] Call for the provider(s) on call. You can also call our office at [phone] and then follow the prompts to be connected to the call team.

## 2023-04-16 NOTE — Progress Notes (Signed)
Orthopedic Tech Progress Note Patient Details:  Keith Alvarado [birth year] [id number]  Ortho Devices Type of Ortho Device: Knee Immobilizer, Crutches Ortho Device/Splint Location: LLE Ortho Device/Splint Interventions: Ordered, Application, Adjustment   Post Interventions Patient Tolerated: Well Instructions Provided: Adjustment of device, Care of device, Poper ambulation with device  Keith Alvarado 04/16/2023, 6:18 AM

## 2023-04-16 NOTE — ED Notes (Signed)
Ortho placed knee immobilizer and gave crutch training prior to pt dc. Patient verbalized he must go to his scheduled ortho appt today

## 2023-04-16 NOTE — ED Triage Notes (Signed)
Pt is coming in for left knee pain that also have a protruding object coming from the outside of the knee, he mentions having knee surgery last year in October but had no complications. No recent trauma to the knee, just states the small protrusion started a few days ago and has been increasing in size with increased swelling and pain. No other complaints at this time.

## 2023-04-17 ENCOUNTER — Ambulatory Visit (HOSPITAL_COMMUNITY): Payer: MEDICAID

## 2023-04-17 ENCOUNTER — Other Ambulatory Visit: Payer: Self-pay

## 2023-04-17 ENCOUNTER — Ambulatory Visit (HOSPITAL_BASED_OUTPATIENT_CLINIC_OR_DEPARTMENT_OTHER): Payer: MEDICAID | Admitting: Vascular Surgery

## 2023-04-17 ENCOUNTER — Encounter (HOSPITAL_COMMUNITY)
Admission: RE | Disposition: A | Discharge status: Home / Self Care | Payer: Self-pay | Source: Ambulatory Visit | Attending: Orthopedic Surgery

## 2023-04-17 ENCOUNTER — Other Ambulatory Visit (HOSPITAL_COMMUNITY): Payer: Self-pay

## 2023-04-17 ENCOUNTER — Encounter (HOSPITAL_COMMUNITY): Payer: Self-pay | Admitting: Orthopedic Surgery

## 2023-04-17 ENCOUNTER — Ambulatory Visit (HOSPITAL_COMMUNITY): Payer: MEDICAID | Admitting: Vascular Surgery

## 2023-04-17 ENCOUNTER — Ambulatory Visit (HOSPITAL_COMMUNITY)
Admission: RE | Admit: 2023-04-17 | Discharge: 2023-04-17 | Disposition: A | Discharge status: Home / Self Care | Payer: MEDICAID | Source: Ambulatory Visit | Attending: Orthopedic Surgery | Admitting: Orthopedic Surgery

## 2023-04-17 DIAGNOSIS — X58XXXA Exposure to other specified factors, initial encounter: Secondary | ICD-10-CM | POA: Diagnosis not present

## 2023-04-17 DIAGNOSIS — T84197A Other mechanical complication of internal fixation device of bone of left lower leg, initial encounter: Secondary | ICD-10-CM | POA: Diagnosis not present

## 2023-04-17 DIAGNOSIS — M1732 Unilateral post-traumatic osteoarthritis, left knee: Secondary | ICD-10-CM | POA: Insufficient documentation

## 2023-04-17 DIAGNOSIS — T8484XA Pain due to internal orthopedic prosthetic devices, implants and grafts, initial encounter: Secondary | ICD-10-CM

## 2023-04-17 HISTORY — PX: ORIF TIBIA PLATEAU: SHX2132

## 2023-04-17 LAB — COMPREHENSIVE METABOLIC PANEL
ALT: 88 U/L — ABNORMAL HIGH (ref 0–44)
AST: 89 U/L — ABNORMAL HIGH (ref 15–41)
Albumin: 3.6 g/dL (ref 3.5–5.0)
Alkaline Phosphatase: 89 U/L (ref 38–126)
Anion gap: 14 (ref 5–15)
BUN: <5 mg/dL — ABNORMAL LOW (ref 6–20)
CO2: 23 mmol/L (ref 22–32)
Calcium: 8.9 mg/dL (ref 8.9–10.3)
Chloride: 99 mmol/L (ref 98–111)
Creatinine, Ser: 0.74 mg/dL (ref 0.61–1.24)
GFR, Estimated: >60 mL/min (ref >60)
Glucose, Bld: 85 mg/dL (ref 70–99)
Potassium: 3.9 mmol/L (ref 3.5–5.1)
Sodium: 136 mmol/L (ref 135–145)
Total Bilirubin: 0.9 mg/dL (ref 0.3–1.2)
Total Protein: 6.6 g/dL (ref 6.5–8.1)

## 2023-04-17 LAB — CBC
HCT: 44.4 % (ref 39.0–52.0)
Hemoglobin: 14.9 g/dL (ref 13.0–17.0)
MCH: 33.1 pg (ref 26.0–34.0)
MCHC: 33.6 g/dL (ref 30.0–36.0)
MCV: 98.7 fL (ref 80.0–100.0)
Platelets: 233 10*3/uL (ref 150–400)
RBC: 4.5 MIL/uL (ref 4.22–5.81)
RDW: 14.3 % (ref 11.5–15.5)
WBC: 5 10*3/uL (ref 4.0–10.5)
nRBC: 0 % (ref 0.0–0.2)

## 2023-04-17 SURGERY — OPEN REDUCTION INTERNAL FIXATION (ORIF) TIBIAL PLATEAU
Anesthesia: General | Site: Leg Lower | Laterality: Left

## 2023-04-17 MED ORDER — ROCURONIUM BROMIDE 10 MG/ML (PF) SYRINGE
PREFILLED_SYRINGE | INTRAVENOUS | Status: DC | PRN
  Administered 2023-04-17: 70 mg via INTRAVENOUS

## 2023-04-17 MED ORDER — METHOCARBAMOL 500 MG PO TABS
500.0000 mg | ORAL_TABLET | Freq: Three times a day (TID) | ORAL | 0 refills | Status: DC | PRN
  Filled 2023-04-17: qty 30, 10d supply, fill #0

## 2023-04-17 MED ORDER — ORAL CARE MOUTH RINSE: 15.0000 mL | Freq: Once | OROMUCOSAL | Status: AC

## 2023-04-17 MED ORDER — HYDROMORPHONE HCL 1 MG/ML IJ SOLN: 0.2500 mg | INTRAMUSCULAR | Status: DC | PRN

## 2023-04-17 MED ORDER — MEPERIDINE HCL 25 MG/ML IJ SOLN: 6.2500 mg | INTRAMUSCULAR | Status: DC | PRN

## 2023-04-17 MED ORDER — PROPOFOL 10 MG/ML IV BOLUS
INTRAVENOUS | Status: DC | PRN
Start: 2023-04-17 — End: 2023-04-17
  Administered 2023-04-17: 200 mg via INTRAVENOUS

## 2023-04-17 MED ORDER — LIDOCAINE 2% (20 MG/ML) 5 ML SYRINGE
INTRAMUSCULAR | Status: AC
  Filled 2023-04-17: qty 5

## 2023-04-17 MED ORDER — PROPOFOL 10 MG/ML IV BOLUS
INTRAVENOUS | Status: AC
  Filled 2023-04-17: qty 20

## 2023-04-17 MED ORDER — ONDANSETRON HCL 4 MG/2ML IJ SOLN
INTRAMUSCULAR | Status: DC | PRN
Start: 2023-04-17 — End: 2023-04-17
  Administered 2023-04-17: 4 mg via INTRAVENOUS

## 2023-04-17 MED ORDER — KETOROLAC TROMETHAMINE 30 MG/ML IJ SOLN
INTRAMUSCULAR | Status: AC
  Filled 2023-04-17: qty 1

## 2023-04-17 MED ORDER — CHLORHEXIDINE GLUCONATE 0.12 % MT SOLN: 15.0000 mL | Freq: Once | OROMUCOSAL | Status: AC

## 2023-04-17 MED ORDER — SUGAMMADEX SODIUM 200 MG/2ML IV SOLN
INTRAVENOUS | Status: DC | PRN
  Administered 2023-04-17: 320 mg via INTRAVENOUS

## 2023-04-17 MED ORDER — AMISULPRIDE (ANTIEMETIC) 5 MG/2ML IV SOLN: 10.0000 mg | Freq: Once | INTRAVENOUS | Status: DC | PRN

## 2023-04-17 MED ORDER — KETOROLAC TROMETHAMINE 30 MG/ML IJ SOLN
INTRAMUSCULAR | Status: DC | PRN
  Administered 2023-04-17: 15 mg via INTRAVENOUS

## 2023-04-17 MED ORDER — MIDAZOLAM HCL 2 MG/2ML IJ SOLN
INTRAMUSCULAR | Status: DC | PRN
  Administered 2023-04-17: 2 mg via INTRAVENOUS

## 2023-04-17 MED ORDER — ONDANSETRON 4 MG PO TBDP
4.0000 mg | ORAL_TABLET | Freq: Three times a day (TID) | ORAL | 0 refills | Status: DC | PRN
  Filled 2023-04-17: qty 20, 7d supply, fill #0

## 2023-04-17 MED ORDER — DEXMEDETOMIDINE HCL IN NACL 80 MCG/20ML IV SOLN
INTRAVENOUS | Status: DC | PRN
  Administered 2023-04-17: 4 ug via INTRAVENOUS
  Administered 2023-04-17 (×2): 8 ug via INTRAVENOUS

## 2023-04-17 MED ORDER — CHLORHEXIDINE GLUCONATE 0.12 % MT SOLN
OROMUCOSAL | Status: AC
  Administered 2023-04-17: 15 mL via OROMUCOSAL
  Filled 2023-04-17: qty 15

## 2023-04-17 MED ORDER — PROMETHAZINE HCL 25 MG/ML IJ SOLN: 6.2500 mg | INTRAMUSCULAR | Status: DC | PRN

## 2023-04-17 MED ORDER — ONDANSETRON HCL 4 MG/2ML IJ SOLN
INTRAMUSCULAR | Status: AC
  Filled 2023-04-17: qty 2

## 2023-04-17 MED ORDER — CEFAZOLIN SODIUM-DEXTROSE 2-4 GM/100ML-% IV SOLN
2.0000 g | INTRAVENOUS | Status: AC
  Administered 2023-04-17: 2 g via INTRAVENOUS

## 2023-04-17 MED ORDER — DEXAMETHASONE SODIUM PHOSPHATE 10 MG/ML IJ SOLN
INTRAMUSCULAR | Status: DC | PRN
  Administered 2023-04-17: 10 mg via INTRAVENOUS

## 2023-04-17 MED ORDER — BUPIVACAINE-MELOXICAM ER 400-12 MG/14ML IJ SOLN
INTRAMUSCULAR | Status: AC
  Filled 2023-04-17: qty 1

## 2023-04-17 MED ORDER — CEFAZOLIN SODIUM-DEXTROSE 2-4 GM/100ML-% IV SOLN
INTRAVENOUS | Status: AC
  Filled 2023-04-17: qty 100

## 2023-04-17 MED ORDER — OXYCODONE HCL 5 MG PO TABS: 5.0000 mg | ORAL_TABLET | Freq: Once | ORAL | Status: DC | PRN

## 2023-04-17 MED ORDER — ROCURONIUM BROMIDE 10 MG/ML (PF) SYRINGE
PREFILLED_SYRINGE | INTRAVENOUS | Status: AC
  Filled 2023-04-17: qty 10

## 2023-04-17 MED ORDER — LIDOCAINE 2% (20 MG/ML) 5 ML SYRINGE
INTRAMUSCULAR | Status: DC | PRN
  Administered 2023-04-17: 60 mg via INTRAVENOUS

## 2023-04-17 MED ORDER — DEXAMETHASONE SODIUM PHOSPHATE 10 MG/ML IJ SOLN
INTRAMUSCULAR | Status: AC
  Filled 2023-04-17: qty 1

## 2023-04-17 MED ORDER — FENTANYL CITRATE (PF) 250 MCG/5ML IJ SOLN
INTRAMUSCULAR | Status: DC | PRN
  Administered 2023-04-17: 100 ug via INTRAVENOUS
  Administered 2023-04-17 (×2): 50 ug via INTRAVENOUS

## 2023-04-17 MED ORDER — 0.9 % SODIUM CHLORIDE (POUR BTL) OPTIME
TOPICAL | Status: DC | PRN
  Administered 2023-04-17: 1000 mL

## 2023-04-17 MED ORDER — MIDAZOLAM HCL 2 MG/2ML IJ SOLN
INTRAMUSCULAR | Status: AC
  Filled 2023-04-17: qty 2

## 2023-04-17 MED ORDER — OXYCODONE HCL 5 MG/5ML PO SOLN: 5.0000 mg | Freq: Once | ORAL | Status: DC | PRN

## 2023-04-17 MED ORDER — OXYCODONE HCL 5 MG PO TABS
5.0000 mg | ORAL_TABLET | Freq: Three times a day (TID) | ORAL | 0 refills | Status: DC | PRN
  Filled 2023-04-17: qty 30, 5d supply, fill #0

## 2023-04-17 MED ORDER — KETOROLAC TROMETHAMINE 10 MG PO TABS
10.0000 mg | ORAL_TABLET | Freq: Four times a day (QID) | ORAL | 0 refills | Status: AC | PRN
  Filled 2023-04-17: qty 20, 5d supply, fill #0

## 2023-04-17 MED ORDER — FENTANYL CITRATE (PF) 250 MCG/5ML IJ SOLN
INTRAMUSCULAR | Status: AC
  Filled 2023-04-17: qty 5

## 2023-04-17 MED ORDER — BUPIVACAINE-MELOXICAM ER 400-12 MG/14ML IJ SOLN
INTRAMUSCULAR | Status: DC | PRN
  Administered 2023-04-17: 400 mg

## 2023-04-17 MED ORDER — DEXMEDETOMIDINE HCL IN NACL 80 MCG/20ML IV SOLN
INTRAVENOUS | Status: AC
  Filled 2023-04-17: qty 20

## 2023-04-17 MED ORDER — LACTATED RINGERS IV SOLN: INTRAVENOUS | Status: DC

## 2023-04-17 SURGICAL SUPPLY — 72 items
BAG COUNTER SPONGE SURGICOUNT (BAG) ×1 IMPLANT
BAG SPNG CNTER NS LX DISP (BAG)
BANDAGE ESMARK 6X9 LF (GAUZE/BANDAGES/DRESSINGS) ×1 IMPLANT
BLADE CLIPPER SURG (BLADE) IMPLANT
BLADE SURG 10 STRL SS (BLADE) ×1 IMPLANT
BLADE SURG 15 STRL LF DISP TIS (BLADE) ×1 IMPLANT
BLADE SURG 15 STRL SS (BLADE)
BNDG CMPR 5X4 KNIT ELC UNQ LF (GAUZE/BANDAGES/DRESSINGS) ×1
BNDG CMPR 6 X 5 YARDS HK CLSR (GAUZE/BANDAGES/DRESSINGS) ×1
BNDG CMPR 9X6 STRL LF SNTH (GAUZE/BANDAGES/DRESSINGS)
BNDG COHESIVE 4X5 TAN STRL (GAUZE/BANDAGES/DRESSINGS) ×1 IMPLANT
BNDG ELASTIC 4INX 5YD STR LF (GAUZE/BANDAGES/DRESSINGS) IMPLANT
BNDG ELASTIC 4X5.8 VLCR STR LF (GAUZE/BANDAGES/DRESSINGS) ×1 IMPLANT
BNDG ELASTIC 6INX 5YD STR LF (GAUZE/BANDAGES/DRESSINGS) IMPLANT
BNDG ELASTIC 6X5.8 VLCR STR LF (GAUZE/BANDAGES/DRESSINGS) ×1 IMPLANT
BNDG ESMARK 6X9 LF (GAUZE/BANDAGES/DRESSINGS)
BNDG GAUZE DERMACEA FLUFF 4 (GAUZE/BANDAGES/DRESSINGS) ×1 IMPLANT
BNDG GZE DERMACEA 4 6PLY (GAUZE/BANDAGES/DRESSINGS) ×1
BRUSH SCRUB EZ PLAIN DRY (MISCELLANEOUS) ×2 IMPLANT
CANISTER SUCT 3000ML PPV (MISCELLANEOUS) ×1 IMPLANT
COVER SURGICAL LIGHT HANDLE (MISCELLANEOUS) ×1 IMPLANT
CUFF TOURN SGL QUICK 34 (TOURNIQUET CUFF)
CUFF TRNQT CYL 34X4.125X (TOURNIQUET CUFF) ×1 IMPLANT
DRAPE C-ARM 42X72 X-RAY (DRAPES) ×1 IMPLANT
DRAPE C-ARMOR (DRAPES) ×1 IMPLANT
DRAPE HALF SHEET 40X57 (DRAPES) IMPLANT
DRAPE INCISE IOBAN 66X45 STRL (DRAPES) ×1 IMPLANT
DRAPE U-SHAPE 47X51 STRL (DRAPES) ×1 IMPLANT
DRSG ADAPTIC 3X8 NADH LF (GAUZE/BANDAGES/DRESSINGS) ×1 IMPLANT
DRSG MEPILEX POST OP 4X12 (GAUZE/BANDAGES/DRESSINGS) IMPLANT
ELECT REM PT RETURN 9FT ADLT (ELECTROSURGICAL) ×1
ELECTRODE REM PT RTRN 9FT ADLT (ELECTROSURGICAL) ×1 IMPLANT
GAUZE PAD ABD 8X10 STRL (GAUZE/BANDAGES/DRESSINGS) ×2 IMPLANT
GAUZE SPONGE 4X4 12PLY STRL (GAUZE/BANDAGES/DRESSINGS) ×1 IMPLANT
GLOVE BIO SURGEON STRL SZ7.5 (GLOVE) ×1 IMPLANT
GLOVE BIO SURGEON STRL SZ8 (GLOVE) ×1 IMPLANT
GLOVE BIOGEL PI IND STRL 7.5 (GLOVE) ×1 IMPLANT
GLOVE BIOGEL PI IND STRL 8 (GLOVE) ×1 IMPLANT
GLOVE SURG ORTHO LTX SZ7.5 (GLOVE) ×2 IMPLANT
GOWN STRL REUS W/ TWL LRG LVL3 (GOWN DISPOSABLE) ×2 IMPLANT
GOWN STRL REUS W/ TWL XL LVL3 (GOWN DISPOSABLE) ×1 IMPLANT
GOWN STRL REUS W/TWL LRG LVL3 (GOWN DISPOSABLE) ×2
GOWN STRL REUS W/TWL XL LVL3 (GOWN DISPOSABLE) ×1
IMMOBILIZER KNEE 22 UNIV (SOFTGOODS) ×1 IMPLANT
KIT BASIN OR (CUSTOM PROCEDURE TRAY) ×1 IMPLANT
KIT TURNOVER KIT B (KITS) ×1 IMPLANT
NDL SUT 6 .5 CRC .975X.05 MAYO (NEEDLE) IMPLANT
NEEDLE MAYO TAPER (NEEDLE)
NS IRRIG 1000ML POUR BTL (IV SOLUTION) ×1 IMPLANT
PACK ORTHO EXTREMITY (CUSTOM PROCEDURE TRAY) ×1 IMPLANT
PAD ARMBOARD 7.5X6 YLW CONV (MISCELLANEOUS) ×2 IMPLANT
PAD CAST 4YDX4 CTTN HI CHSV (CAST SUPPLIES) ×1 IMPLANT
PADDING CAST COTTON 4X4 STRL (CAST SUPPLIES)
PADDING CAST COTTON 6X4 STRL (CAST SUPPLIES) ×1 IMPLANT
SPONGE T-LAP 18X18 ~~LOC~~+RFID (SPONGE) ×1 IMPLANT
STAPLER VISISTAT 35W (STAPLE) ×1 IMPLANT
STOCKINETTE IMPERVIOUS LG (DRAPES) ×1 IMPLANT
SUCTION TUBE FRAZIER 10FR DISP (SUCTIONS) ×1 IMPLANT
SUT ETHILON 2 0 FS 18 (SUTURE) IMPLANT
SUT PROLENE 0 CT 2 (SUTURE) ×2 IMPLANT
SUT VIC AB 0 CT1 27 (SUTURE)
SUT VIC AB 0 CT1 27XBRD ANBCTR (SUTURE) ×1 IMPLANT
SUT VIC AB 1 CT1 27 (SUTURE)
SUT VIC AB 1 CT1 27XBRD ANBCTR (SUTURE) ×1 IMPLANT
SUT VIC AB 2-0 CT1 27 (SUTURE) ×1
SUT VIC AB 2-0 CT1 TAPERPNT 27 (SUTURE) ×2 IMPLANT
TOWEL GREEN STERILE (TOWEL DISPOSABLE) ×2 IMPLANT
TOWEL GREEN STERILE FF (TOWEL DISPOSABLE) ×1 IMPLANT
TRAY FOLEY MTR SLVR 16FR STAT (SET/KITS/TRAYS/PACK) IMPLANT
TUBE CONNECTING 12X1/4 (SUCTIONS) ×1 IMPLANT
WATER STERILE IRR 1000ML POUR (IV SOLUTION) ×2 IMPLANT
YANKAUER SUCT BULB TIP NO VENT (SUCTIONS) ×1 IMPLANT

## 2023-04-17 NOTE — Op Note (Signed)
04/17/2023  8:15 AM  PATIENT:  Keith Alvarado  10-11-1975 male   MEDICAL RECORD NUMBER: [id number]  PRE-OPERATIVE DIAGNOSIS:   1. LEFT TIBIA SYMPTOMATIC HARDWARE 2. LEFT KNEE PAIN  POST-OPERATIVE DIAGNOSIS:   1. LEFT TIBIA SYMPTOMATIC HARDWARE 2. LEFT KNEE PAIN  PROCEDURE:   REMOVAL OF DEEP IMPLANT LEFT TIBIA MANUAL APPLICATION OF STRESS LEFT KNEE UNDER FLUOROSCOPY  SURGEON:  Doralee Albino. Carola Frost, M.D.  ASSISTANT:  None.  ANESTHESIA:  General.  COMPLICATIONS:  None.  TOURNIQUET: None.  SPECIMENS: None.  ESTIMATED BLOOD LOSS:  Minimal.  DISPOSITION:  To PACU.  CONDITION:  Stable.  DELAY START OF DVT PROPHYLAXIS BECAUSE OF BLEEDING RISK: NO  BRIEF SUMMARY OF INDICATION FOR PROCEDURE:  Patient is a pleasant 47 y.o. who underwent plate fixation of a left tibial plateau fracture with subsequent healing. Despite conservative measures, hardware related symptoms have persisted and patient was scheduled electively. However, in the last twenty four hours one screw has migrated out three cm and is putting significant pressure on the skin. Therefore, I discussed with the patient the risks and benefits of surgical removal including infection, nerve or vessel injury, failure to alleviate symptoms, occult nonunion, re-fracture, DVT, PE, and multiple others. He did wish to proceed.   BRIEF SUMMARY OF PROCEDURE:  The patient was taken to the operating room after administration of 2 g of Ancef.  General anesthesia was induced. The left lower extremity was prepped and draped in usual sterile fashion.  No tourniquet was used during the procedure. I remade the old distal incision and dissected sharply down to the plate, elevating the soft tissues. I identified and removed all screws. I placed a Cobb over top of the plate and underneath with the sharp edge away from the periosteum to generate some mobility as there were small areas of bone overgrowth and extensive soft tissue connections  down to the plate.  The plate was gently rocked to assist with this and then extracted atraumatically. Final x-rays confirmed removal of all hardware and a healed fracture.   Because of the patient's knee pain and fracture subsidence suggesting possible occult nonunion or instability, a stress evaluation was performed, consisting of application of valgus force while holding the knee on an AP view. Under live fluoro, I did not identify  any widening of the medial compartment to suggest MCL deficiency but healed subsidence of the lateral plateau was again noted. Consequently it was deemed stable.  The wounds were irrigated thoroughly and closed in standard fashion with vicryl and nylon. A sterile gently compressive dressing was applied.  The patient was taken to the PACU in stable condition.   PROGNOSIS: Patient will be weightbearing as tolerated with unrestricted active and passive motion of the knee and ankle. Bleeding would be anticipated. He may change or remove his dressing in 48 hours and shower. Patient will follow up in 10 days for removal of sutures. An off loader custom brace has been recommended.      Doralee Albino. Carola Frost, M.D.

## 2023-04-17 NOTE — Anesthesia Procedure Notes (Signed)
Procedure Name: Intubation Date/Time: 04/17/2023 8:28 AM  Performed by: Ayesha Rumpf, CRNAPre-anesthesia Checklist: Patient identified, Emergency Drugs available, Suction available and Patient being monitored Patient Re-evaluated:Patient Re-evaluated prior to induction Oxygen Delivery Method: Circle System Utilized Preoxygenation: Pre-oxygenation with 100% oxygen Induction Type: IV induction Ventilation: Mask ventilation without difficulty Laryngoscope Size: Mac and 4 Grade View: Grade II Tube type: Oral Tube size: 7.5 mm Number of attempts: 1 Airway Equipment and Method: Stylet and Oral airway Placement Confirmation: ETT inserted through vocal cords under direct vision, positive ETCO2 and breath sounds checked- equal and bilateral Secured at: 23 cm Tube secured with: Tape Dental Injury: Teeth and Oropharynx as per pre-operative assessment  Comments: BURP utilized to improve DL view.

## 2023-04-17 NOTE — Transfer of Care (Signed)
Immediate Anesthesia Transfer of Care Note  Patient: Keith Alvarado  Procedure(s) Performed: REMOVAL OF HARDWARE TIBIA PLATEAU (Left: Leg Lower)  Patient Location: PACU  Anesthesia Type:General  Level of Consciousness: awake, drowsy, patient cooperative, and responds to stimulation  Airway & Oxygen Therapy: Patient Spontanous Breathing  Post-op Assessment: Report given to RN and Post -op Vital signs reviewed and stable  Post vital signs: Reviewed and stable  Last Vitals:  Vitals Value Taken Time  BP 164/109 04/17/23 0940  Temp    Pulse 94 04/17/23 0943  Resp 28 04/17/23 0943  SpO2 96 % 04/17/23 0943  Vitals shown include unfiled device data.  Last Pain:  Vitals:   04/17/23 0641  TempSrc:   PainSc: 0-No pain         Complications: No notable events documented.

## 2023-04-17 NOTE — Discharge Instructions (Signed)
[facility] Discharge Instructions   General Discharge Instructions   WEIGHT BEARING STATUS: Weightbearing tolerated left leg, you may need to use crutches or walker for few days  RANGE OF MOTION/ACTIVITY: Unrestricted range of motion left knee.  Increase activity as tolerated  Bone health: Continue with vitamin D supplementation   Review the following resource for additional information regarding bone health  [email or web address]  Wound Care: Daily wound care starting on 04/19/2023.  Please see below  Discharge Wound Care Instructions  Do NOT apply any ointments, solutions or lotions to pin sites or surgical wounds.  These prevent needed drainage and even though solutions like hydrogen peroxide kill bacteria, they also damage cells lining the pin sites that help fight infection.  Applying lotions or ointments can keep the wounds moist and can cause them to breakdown and open up as well. This can increase the risk for infection. When in doubt call the office.  Surgical incisions should be dressed daily.  If any drainage is noted, use one layer of adaptic or Mepitel, then gauze, Kerlix, and an ace wrap.  [email or web address] [email or web address]  [email or web address]  These dressing supplies should be available at local medical supply stores ([facility], [facility], etc). They are not usually carried at places like [facility], [facility], [facility], etc  Once the incision is completely dry and without drainage, it may be left open to air out.  Showering may begin 36-48 hours later.  Cleaning gently with soap and water.   Diet: as you were eating previously.  Can use over the counter stool softeners and bowel preparations, such as  Miralax, to help with bowel movements.  Narcotics can be constipating.  Be sure to drink plenty of fluids  PAIN MEDICATION USE AND EXPECTATIONS  You have likely been given narcotic medications to help control your pain.  After a traumatic event that results in an fracture (broken bone) with or without surgery, it is ok to use narcotic pain medications to help control one's pain.  We understand that everyone responds to pain differently and each individual patient will be evaluated on a regular basis for the continued need for narcotic medications. Ideally, narcotic medication use should last no more than 6-8 weeks (coinciding with fracture healing).   As a patient it is your responsibility as well to monitor narcotic medication use and report the amount and frequency you use these medications when you come to your office visit.   We would also advise that if you are using narcotic medications, you should take a dose prior to therapy to maximize you participation.  IF YOU ARE ON NARCOTIC MEDICATIONS IT IS NOT PERMISSIBLE TO OPERATE A MOTOR VEHICLE (MOTORCYCLE/CAR/TRUCK/MOPED) OR HEAVY MACHINERY DO NOT MIX NARCOTICS WITH OTHER CNS (CENTRAL NERVOUS SYSTEM) DEPRESSANTS SUCH AS ALCOHOL   POST-OPERATIVE OPIOID TAPER INSTRUCTIONS: It is important to wean off of your opioid medication as soon as possible. If you do not need pain medication after your surgery it is ok to stop day one. Opioids include: Codeine, Hydrocodone(Norco, Vicodin), Oxycodone(Percocet, oxycontin) and hydromorphone amongst others.  Long term and even short term use of opiods can cause: Increased pain response Dependence Constipation Depression Respiratory depression And more.  Withdrawal symptoms can include Flu like symptoms Nausea, vomiting And more Techniques to manage these symptoms Hydrate well Eat regular healthy meals Stay active Use relaxation techniques(deep breathing, meditating, yoga) Do Not substitute Alcohol  to help with tapering If you have been on opioids for less than two weeks and do  not have pain than it is ok to stop all together.  Plan to wean off of opioids This plan should start within one week post op of your fracture surgery  Maintain the same interval or time between taking each dose and first decrease the dose.  Cut the total daily intake of opioids by one tablet each day Next start to increase the time between doses. The last dose that should be eliminated is the evening dose.    STOP SMOKING OR USING NICOTINE PRODUCTS!!!!  As discussed nicotine severely impairs your body's ability to heal surgical and traumatic wounds but also impairs bone healing.  Wounds and bone heal by forming microscopic blood vessels (angiogenesis) and nicotine is a vasoconstrictor (essentially, shrinks blood vessels).  Therefore, if vasoconstriction occurs to these microscopic blood vessels they essentially disappear and are unable to deliver necessary nutrients to the healing tissue.  This is one modifiable factor that you can do to dramatically increase your chances of healing your injury.    (This means no smoking, no nicotine gum, patches, etc)  DO NOT USE NONSTEROIDAL ANTI-INFLAMMATORY DRUGS (NSAID'S)  Using products such as Advil (ibuprofen), Aleve (naproxen), Motrin (ibuprofen) for additional pain control during fracture healing can delay and/or prevent the healing response.  If you would like to take over the counter (OTC) medication, Tylenol (acetaminophen) is ok.  However, some narcotic medications that are given for pain control contain acetaminophen as well. Therefore, you should not exceed more than 4000 mg of tylenol in a day if you do not have liver disease.  Also note that there are may OTC medicines, such as cold medicines and allergy medicines that my contain tylenol as well.  If you have any questions about medications and/or interactions please ask your doctor/PA or your pharmacist.      ICE  AND ELEVATE INJURED/OPERATIVE EXTREMITY  Using ice and elevating the injured extremity above your heart can help with swelling and pain control.  Icing in a pulsatile fashion, such as 20 minutes on and 20 minutes off, can be followed.    Do not place ice directly on skin. Make sure there is a barrier between to skin and the ice pack.    Using frozen items such as frozen peas works well as the conform nicely to the are that needs to be iced.  USE AN ACE WRAP OR TED HOSE FOR SWELLING CONTROL  In addition to icing and elevation, Ace wraps or TED hose are used to help limit and resolve swelling.  It is recommended to use Ace wraps or TED hose until you are informed to stop.    When using Ace Wraps start the wrapping distally (farthest away from the body) and wrap proximally (closer to the body)   Example: If you had surgery on your leg or thing and you do not have a splint on, start the ace wrap at the toes and work your way up to the thigh        If you had surgery on your upper extremity and do not have a splint on, start the ace wrap at your fingers and work your way up to the upper arm  IF YOU ARE IN A SPLINT OR CAST DO NOT REMOVE IT FOR ANY REASON   If your splint gets wet for any reason please contact the office immediately. You may shower in your splint or cast as long as you keep it dry.  This can be done by wrapping in a  cast cover or garbage back (or similar)  Do Not stick any thing down your splint or cast such as pencils, money, or hangers to try and scratch yourself with.  If you feel itchy take benadryl as prescribed on the bottle for itching  IF YOU ARE IN A CAM BOOT (BLACK BOOT)  You may remove boot periodically. Perform daily dressing changes as noted below.  Wash the liner of the boot regularly and wear a sock when wearing the boot. It is recommended that you sleep in the boot until told otherwise    Call office for the following: Temperature greater than 101F Persistent nausea  and vomiting Severe uncontrolled pain Redness, tenderness, or signs of infection (pain, swelling, redness, odor or green/yellow discharge around the site) Difficulty breathing, headache or visual disturbances Hives Persistent dizziness or light-headedness Extreme fatigue Any other questions or concerns you may have after discharge  In an emergency, call 911 or go to an Emergency Department at a nearby hospital  HELPFUL INFORMATION  If you had a block, it will wear off between 8-24 hrs postop typically.  This is period when your pain may go from nearly zero to the pain you would have had postop without the block.  This is an abrupt transition but nothing dangerous is happening.  You may take an extra dose of narcotic when this happens.  You should wean off your narcotic medicines as soon as you are able.  Most patients will be off or using minimal narcotics before their first postop appointment.   We suggest you use the pain medication the first night prior to going to bed, in order to ease any pain when the anesthesia wears off. You should avoid taking pain medications on an empty stomach as it will make you nauseous.  Do not drink alcoholic beverages or take illicit drugs when taking pain medications.  In most states it is against the law to drive while you are in a splint or sling.  And certainly against the law to drive while taking narcotics.  You may return to work/school in the next couple of days when you feel up to it.   Pain medication may make you constipated.  Below are a few solutions to try in this order: Decrease the amount of pain medication if you aren't having pain. Drink lots of decaffeinated fluids. Drink prune juice and/or each dried prunes  If the first 3 don't work start with additional solutions Take Colace - an over-the-counter stool softener Take Senokot - an over-the-counter laxative Take Miralax - a stronger over-the-counter laxative     CALL THE  OFFICE WITH ANY QUESTIONS OR CONCERNS: [phone]   VISIT OUR WEBSITE FOR ADDITIONAL INFORMATION: [email or web address]

## 2023-04-18 ENCOUNTER — Encounter (HOSPITAL_COMMUNITY): Payer: Self-pay | Admitting: Orthopedic Surgery

## 2023-04-20 NOTE — Anesthesia Postprocedure Evaluation (Signed)
Anesthesia Post Note  Patient: Keith Alvarado  Procedure(s) Performed: REMOVAL OF HARDWARE TIBIA PLATEAU (Left: Leg Lower)     Patient location during evaluation: PACU Anesthesia Type: General Level of consciousness: awake Pain management: pain level controlled Vital Signs Assessment: post-procedure vital signs reviewed and stable Respiratory status: spontaneous breathing Cardiovascular status: stable Postop Assessment: no apparent nausea or vomiting Anesthetic complications: no  No notable events documented.  Last Vitals:  Vitals:   04/17/23 1000 04/17/23 1015  BP: (!) 163/104 (!) 163/106  Pulse: 89 90  Resp: 17 18  Temp:  36.9 C  SpO2: 91% 94%    Last Pain:  Vitals:   04/17/23 1015  TempSrc:   PainSc: 3    Pain Goal:                   Caren Macadam

## 2024-03-05 NOTE — Progress Notes (Signed)
 03/08/2024 Keith Alvarado [id number] 1975-11-06  Referring provider: Mavis Redge SAILOR, FNP Primary GI doctor: Dr. Charlanne  ASSESSMENT AND PLAN:  Intermittent LLQ AB pain, change in bowel habits, bloating, can have BM after eating, occ loose Intermittent severe abdominal pain and bloating, exacerbated by certain foods and alcohol. Differential includes gastritis, GERD, and cannabinoid hyperemesis syndrome. - Order CT scan of the abdomen. - Check laboratory tests. - Add on omeprazole  40 mg once daily - Advise to start fiber supplement such as Benefiber or Citrucel. - Advise to reduce or stop alcohol consumption, particularly whiskey. - Provide information on cannabinoid hyperemesis syndrome. - Consider endoscopy based on CT and lab results. - consider SIBO testing or empiric treatment  Elevated liver function 06/26/2023 alk phos 162, AST 106, ALT 96, total bili 0.9, lipase 147 Unremarkable iron and TIBC negative acute hepatitis panel 01/09/2023 Suspected ETOH versus MASH versus other - Labs to include: hepatitis C antibody, hepatitis B surface antigen, hepatitis B cor antibody, iron, ferritin, total iron binding capacity, IgG, ANA, Antismooth muscle antibody, celiac, thyroid, alpha-one-antitrypsin level - will proceed with CT AB and pelvis with contrast due to the AB pain -Consider elastography - Abstain from all alcohol including beer, wine, liquor, and non-alcoholic beer.  - Work to maintain a health weight through portion control and exercise Maximize control of any hypergycemia and hyperlipidemia  GERD Stopped beer, continues to drink whiskey, no NSAIDS  Negative H. pylori stool 06/2023 - added on omeprazole  40 mg - stop all ETOH -Lifestyle changes discussed, avoid NSAIDS, ETOH, hand out given to the patient -Schedule EGD AFTER CT AB AND PELVIS AND LABS at [facility] to evaluate GERD, esophagitis, hiatal hernia,H pylori. I discussed risks of EGD with patient today,  including risk of sedation, bleeding or perforation. Patient provides understanding and gave verbal consent to proceed.  Screening colonoscopy 12/31/2022 Cologuard negative  Cannabinoid hyperemesis syndrome Possible cannabinoid hyperemesis syndrome due to daily marijuana use, causing abdominal pain and nausea. - Provide information on cannabinoid hyperemesis syndrome.  Patient Care Team: Patient, [name] as PCP - General (General Practice)  HISTORY OF PRESENT ILLNESS: 48 y.o. male with a past medical history listed below presents as a new patient for evaluation of AB pain.   Previously seen by [facility] 07/24/2023 for epigastric pain elevated liver function lipase and GERD.  He was suppose to get hepatocellular workup and CT scan but I do not see that where this was done.  Discussed the use of AI scribe software for clinical note transcription with the patient, who gave verbal consent to proceed.  History of Present Illness   Keith Alvarado is a 48 year old male who presents with abdominal pain and gastrointestinal symptoms.  He has experienced severe abdominal pain over the past weekend, describing it as 'some of the worst pain ever.' The pain was localized and felt like gas, accompanied by bloating and difficulty having bowel movements. Typically, he has regular bowel movements every morning and sometimes multiple times a day after eating. His stools are usually formed, but he experiences loose stools when bloated.  He occasionally experiences nausea and vomiting, particularly when bloated, and has tried various over-the-counter medications like Pepto-Bismol and Alka-Seltzer to relieve symptoms. He has tried charcoal tablets to alleviate symptoms. He does not regularly take Aleve, ibuprofen , or Goody Powders. He mentions taking a 'purple and white pill' but is unsure of its name or purpose.  He had a negative Cologuard test in 2024. A  GI doctor in December recommended a CT scan and  x-ray, but these were not completed due to insurance changes.  No recent weight loss, although he intentionally lost weight in the past, dropping from 200 pounds to 175-160 pounds. No dark black stools or blood in his stool.  His social history includes daily or near-daily alcohol consumption, specifically whiskey, and regular marijuana use. He has reduced his beer intake significantly.      He  reports that he has never smoked. He has never used smokeless tobacco. He reports current alcohol use of about 6.0 - 8.0 standard drinks of alcohol per week. He reports current drug use. Drug: Marijuana.  RELEVANT GI HISTORY, IMAGING AND LABS: Results   DIAGNOSTIC Cologuard: Negative     Wt Readings from Last 10 Encounters:  03/08/24 166 lb 2 oz (75.4 kg)  04/17/23 190 lb (86.2 kg)  05/16/22 165 lb (74.8 kg)  12/11/20 160 lb (72.6 kg)  11/30/20 157 lb (71.2 kg)  07/17/20 160 lb (72.6 kg)  07/05/20 160 lb (72.6 kg)  01/17/20 175 lb (79.4 kg)    CBC    Component Value Date/Time   WBC 5.0 04/17/2023 0654   RBC 4.50 04/17/2023 0654   HGB 14.9 04/17/2023 0654   HCT 44.4 04/17/2023 0654   PLT 233 04/17/2023 0654   MCV 98.7 04/17/2023 0654   MCH 33.1 04/17/2023 0654   MCHC 33.6 04/17/2023 0654   RDW 14.3 04/17/2023 0654   LYMPHSABS 3.0 05/16/2022 0712   MONOABS 1.8 (H) 05/16/2022 0712   EOSABS 0.2 05/16/2022 0712   BASOSABS 0.1 05/16/2022 0712   Recent Labs    04/17/23 0654  HGB 14.9    CMP     Component Value Date/Time   NA 136 04/17/2023 0654   K 3.9 04/17/2023 0654   CL 99 04/17/2023 0654   CO2 23 04/17/2023 0654   GLUCOSE 85 04/17/2023 0654   BUN <5 (L) 04/17/2023 0654   CREATININE 0.74 04/17/2023 0654   CALCIUM  8.9 04/17/2023 0654   PROT 6.6 04/17/2023 0654   ALBUMIN 3.6 04/17/2023 0654   AST 89 (H) 04/17/2023 0654   ALT 88 (H) 04/17/2023 0654   ALKPHOS 89 04/17/2023 0654   BILITOT 0.9 04/17/2023 0654   GFRNONAA >60 04/17/2023 0654      Latest Ref Rng & Units  04/17/2023    6:54 AM 05/16/2022    7:12 AM  Hepatic Function  Total Protein 6.5 - 8.1 g/dL 6.6  6.3   Albumin 3.5 - 5.0 g/dL 3.6  3.6   AST 15 - 41 U/L 89  27   ALT 0 - 44 U/L 88  20   Alk Phosphatase 38 - 126 U/L 89  75   Total Bilirubin 0.3 - 1.2 mg/dL 0.9  0.7       Current Medications:    Current Outpatient Medications (Cardiovascular):    lisinopril (ZESTRIL) 10 MG tablet, Take 10 mg by mouth daily.   Current Outpatient Medications (Analgesics):    aspirin -sod bicarb-citric acid (ALKA-SELTZER) 325 MG TBEF tablet, Take 650 mg by mouth every 6 (six) hours as needed (indigestion).   ketorolac  (TORADOL ) 10 MG tablet, Take 1 tablet (10 mg total) by mouth every 6 (six) hours as needed.   Current Outpatient Medications (Other):    ascorbic acid  (VITAMIN C ) 1000 MG tablet, Take 1 tablet (1,000 mg total) by mouth daily.   bismuth subsalicylate (PEPTO BISMOL) 262 MG/15ML suspension, Take 30 mLs by mouth every 6 (  six) hours as needed for indigestion or diarrhea or loose stools.   calcium  carbonate (TUMS - DOSED IN MG ELEMENTAL CALCIUM ) 500 MG chewable tablet, Chew 2 tablets by mouth as needed for indigestion or heartburn.   Cholecalciferol  125 MCG (5000 UT) TABS, Take 1 tablet by mouth daily.   hydrOXYzine (ATARAX) 50 MG tablet, Take 50 mg by mouth 3 (three) times daily.   hyoscyamine  (LEVSIN ) 0.125 MG tablet, Take 1 tablet (0.125 mg total) by mouth every 6 (six) hours as needed for cramping.   mirtazapine (REMERON) 30 MG tablet, Take 30 mg by mouth at bedtime.   omeprazole  (PRILOSEC) 40 MG capsule, Take 1 capsule (40 mg total) by mouth daily.  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 Family History:  His family history includes Healthy in his father and mother.  REVIEW OF SYSTEMS  : All other systems reviewed and negative except where noted in the History of Present Illness.  PHYSICAL EXAM: BP (!) 160/110 (Cuff Size: Normal)   Ht 5' 10.5 (1.791 m) Comment: height measured without shoes  Wt 166 lb 2 oz (75.4 kg)   BMI 23.50 kg/m  Physical Exam   GENERAL APPEARANCE: Well nourished, in no apparent distress HEENT: No cervical lymphadenopathy, unremarkable thyroid, sclerae anicteric, conjunctiva pink RESPIRATORY: Respiratory effort normal, BS equal bilateral without rales, rhonchi, wheezing CARDIO: RRR with no MRGs, peripheral pulses intact ABDOMEN: Soft, non distended, active bowel sounds in all 4 quadrants, no tenderness to palpation, no rebound, no mass appreciated RECTAL: declines MUSCULOSKELETAL: Full ROM, normal gait, without edema SKIN: Dry, intact without rashes or lesions. No jaundice. NEURO: Alert, oriented, no focal deficits PSYCH: Cooperative, normal mood and affect.      Alan JONELLE Coombs, PA-C 11:45 AM

## 2024-03-08 ENCOUNTER — Encounter: Payer: Self-pay | Admitting: Physician Assistant

## 2024-03-08 ENCOUNTER — Ambulatory Visit (INDEPENDENT_AMBULATORY_CARE_PROVIDER_SITE_OTHER): Payer: MEDICAID | Admitting: Physician Assistant

## 2024-03-08 ENCOUNTER — Other Ambulatory Visit (INDEPENDENT_AMBULATORY_CARE_PROVIDER_SITE_OTHER): Payer: MEDICAID

## 2024-03-08 VITALS — BP 160/110 | Ht 70.5 in | Wt 166.1 lb

## 2024-03-08 DIAGNOSIS — R1084 Generalized abdominal pain: Secondary | ICD-10-CM

## 2024-03-08 DIAGNOSIS — K219 Gastro-esophageal reflux disease without esophagitis: Secondary | ICD-10-CM

## 2024-03-08 DIAGNOSIS — R7989 Other specified abnormal findings of blood chemistry: Secondary | ICD-10-CM

## 2024-03-08 DIAGNOSIS — R110 Nausea: Secondary | ICD-10-CM | POA: Diagnosis not present

## 2024-03-08 LAB — HEPATIC FUNCTION PANEL
ALT: 24 U/L (ref 0–53)
AST: 34 U/L (ref 0–37)
Albumin: 3.9 g/dL (ref 3.5–5.2)
Alkaline Phosphatase: 78 U/L (ref 39–117)
Bilirubin, Direct: 0.1 mg/dL (ref 0.0–0.3)
Total Bilirubin: 0.4 mg/dL (ref 0.2–1.2)
Total Protein: 6.6 g/dL (ref 6.0–8.3)

## 2024-03-08 LAB — GAMMA GT: GGT: 142 U/L — ABNORMAL HIGH (ref 7–51)

## 2024-03-08 LAB — CBC WITH DIFFERENTIAL/PLATELET
Basophils Absolute: 0.1 K/uL (ref 0.0–0.1)
Basophils Relative: 1.3 % (ref 0.0–3.0)
Eosinophils Absolute: 0.3 K/uL (ref 0.0–0.7)
Eosinophils Relative: 5.7 % — ABNORMAL HIGH (ref 0.0–5.0)
HCT: 42.8 % (ref 39.0–52.0)
Hemoglobin: 14.3 g/dL (ref 13.0–17.0)
Lymphocytes Relative: 29.7 % (ref 12.0–46.0)
Lymphs Abs: 1.4 K/uL (ref 0.7–4.0)
MCHC: 33.4 g/dL (ref 30.0–36.0)
MCV: 99.8 fl (ref 78.0–100.0)
Monocytes Absolute: 0.7 K/uL (ref 0.1–1.0)
Monocytes Relative: 14.7 % — ABNORMAL HIGH (ref 3.0–12.0)
Neutro Abs: 2.3 K/uL (ref 1.4–7.7)
Neutrophils Relative %: 48.6 % (ref 43.0–77.0)
Platelets: 370 K/uL (ref 150.0–400.0)
RBC: 4.29 Mil/uL (ref 4.22–5.81)
RDW: 13.8 % (ref 11.5–15.5)
WBC: 4.8 K/uL (ref 4.0–10.5)

## 2024-03-08 LAB — SEDIMENTATION RATE: Sed Rate: 10 mm/h (ref 0–15)

## 2024-03-08 LAB — BASIC METABOLIC PANEL WITH GFR
BUN: 4 mg/dL — ABNORMAL LOW (ref 6–23)
CO2: 33 meq/L — ABNORMAL HIGH (ref 19–32)
Calcium: 8.9 mg/dL (ref 8.4–10.5)
Chloride: 100 meq/L (ref 96–112)
Creatinine, Ser: 0.7 mg/dL (ref 0.40–1.50)
GFR: 109.63 mL/min (ref >60.00)
Glucose, Bld: 85 mg/dL (ref 70–99)
Potassium: 3.5 meq/L (ref 3.5–5.1)
Sodium: 142 meq/L (ref 135–145)

## 2024-03-08 LAB — AMYLASE: Amylase: 47 U/L (ref 27–131)

## 2024-03-08 LAB — LIPASE: Lipase: 178 U/L — ABNORMAL HIGH (ref 11.0–59.0)

## 2024-03-08 MED ORDER — OMEPRAZOLE 40 MG PO CPDR: 40.0000 mg | DELAYED_RELEASE_CAPSULE | Freq: Every day | ORAL | 0 refills | Status: AC

## 2024-03-08 MED ORDER — HYOSCYAMINE SULFATE 0.125 MG PO TABS: 0.1250 mg | ORAL_TABLET | Freq: Four times a day (QID) | ORAL | 0 refills | Status: AC | PRN

## 2024-03-08 NOTE — Patient Instructions (Addendum)
 Your provider has requested that you go to the basement level for lab work before leaving today. Press B on the elevator. The lab is located at the first door on the left as you exit the elevator.  You will be contacted by [facility] in the next 2 days to arrange a CT Abdomen.Pelvis.  The number on your caller ID will be [phone], please answer when they call.  If you have not heard from them in 2 days please call [phone] to schedule.      VISIT SUMMARY:  Today, you were seen for severe abdominal pain and gastrointestinal symptoms that you experienced over the past weekend. You described the pain as some of the worst you have ever felt, accompanied by bloating and difficulty with bowel movements. You also reported occasional nausea and vomiting, particularly when bloated. We discussed your history of alcohol and marijuana use, and previous recommendations from a GI doctor.  YOUR PLAN:  -ABDOMINAL PAIN AND BLOATING: Your abdominal pain and bloating may be due to several causes, including gastritis, GERD, or cannabinoid hyperemesis syndrome. We will start by ordering a CT scan of your abdomen and checking some lab tests. You should start taking a fiber supplement like Benefiber or Citrucel to help with your bowel movements. It is important to reduce or stop your alcohol consumption, especially whiskey, as it can worsen your symptoms. We also provided information on cannabinoid hyperemesis syndrome, which can be caused by daily marijuana use. Depending on the results of the CT scan and lab tests, we may consider an endoscopy in the future.  -GASTRITIS: Gastritis is inflammation of the stomach lining, often caused by alcohol consumption. To help reduce your symptoms, you should reduce or stop drinking alcohol, particularly whiskey.  -GASTROESOPHAGEAL REFLUX DISEASE (GERD): GERD is a condition where stomach acid frequently flows back into the tube connecting your mouth and  stomach, causing discomfort. Alcohol and certain foods can make this worse, so reducing or stopping alcohol consumption, especially whiskey, is advised.  -CANNABINOID HYPEREMESIS SYNDROME: Cannabinoid hyperemesis syndrome is a condition caused by long-term marijuana use, leading to severe nausea and vomiting. We provided you with information on this condition and recommend reducing or stopping marijuana use to see if your symptoms improve.  INSTRUCTIONS:  Please schedule a CT scan of your abdomen and complete the lab tests as soon as possible. Start taking a fiber supplement like Benefiber or Citrucel. Reduce or stop your alcohol consumption, particularly whiskey, and consider reducing or stopping marijuana use. Follow up with us  after completing the CT scan and lab tests to discuss the results and next steps.  Due to recent changes in healthcare laws, you may see the results of your imaging and laboratory studies on MyChart before your provider has had a chance to review them.  We understand that in some cases there may be results that are confusing or concerning to you. Not all laboratory results come back in the same time frame and the provider may be waiting for multiple results in order to interpret others.  Please give us  48 hours in order for your provider to thoroughly review all the results before contacting the office for clarification of your results.    I appreciate the  opportunity to care for you  Thank You   [facility]

## 2024-03-11 ENCOUNTER — Ambulatory Visit: Payer: Self-pay | Admitting: Physician Assistant

## 2024-03-11 DIAGNOSIS — K869 Disease of pancreas, unspecified: Secondary | ICD-10-CM

## 2024-03-11 LAB — CERULOPLASMIN: Ceruloplasmin: 31 mg/dL — ABNORMAL HIGH (ref 14–30)

## 2024-03-11 LAB — IGA: Immunoglobulin A: 334 mg/dL — ABNORMAL HIGH (ref 47–310)

## 2024-03-11 LAB — HEPATITIS B SURFACE ANTIBODY,QUALITATIVE: Hep B S Ab: NONREACTIVE

## 2024-03-11 LAB — ANTI-SMOOTH MUSCLE ANTIBODY, IGG: Actin (Smooth Muscle) Antibody (IGG): <20 U (ref <20)

## 2024-03-11 LAB — HEPATITIS C ANTIBODY: Hepatitis C Ab: NONREACTIVE

## 2024-03-11 LAB — ANTI-NUCLEAR AB-TITER (ANA TITER)
ANA TITER: 1:40 {titer} — ABNORMAL HIGH
ANA Titer 1: 1:40 {titer} — ABNORMAL HIGH

## 2024-03-11 LAB — ALPHA-1-ANTITRYPSIN: A-1 Antitrypsin, Ser: 186 mg/dL (ref 83–199)

## 2024-03-11 LAB — ANA: Anti Nuclear Antibody (ANA): POSITIVE — AB

## 2024-03-11 LAB — MITOCHONDRIAL ANTIBODIES: Mitochondrial M2 Ab, IgG: <=20 U (ref <=20.0)

## 2024-03-11 LAB — TISSUE TRANSGLUTAMINASE, IGA: (tTG) Ab, IgA: <1 U/mL

## 2024-03-11 LAB — HEPATITIS B SURFACE ANTIGEN: Hepatitis B Surface Ag: NONREACTIVE

## 2024-03-11 LAB — IGG: IgG (Immunoglobin G), Serum: 1242 mg/dL (ref 600–1640)

## 2024-03-11 LAB — HEPATITIS A ANTIBODY, TOTAL: Hepatitis A AB,Total: REACTIVE — AB

## 2024-03-17 ENCOUNTER — Ambulatory Visit (HOSPITAL_COMMUNITY)
Admission: RE | Admit: 2024-03-17 | Discharge: 2024-03-17 | Disposition: A | Discharge status: Home / Self Care | Payer: MEDICAID | Source: Ambulatory Visit | Attending: Physician Assistant | Admitting: Physician Assistant

## 2024-03-17 DIAGNOSIS — R7989 Other specified abnormal findings of blood chemistry: Secondary | ICD-10-CM | POA: Insufficient documentation

## 2024-03-17 DIAGNOSIS — R1084 Generalized abdominal pain: Secondary | ICD-10-CM | POA: Diagnosis present

## 2024-03-17 MED ORDER — IOHEXOL 300 MG/ML  SOLN
100.0000 mL | Freq: Once | INTRAMUSCULAR | Status: AC | PRN
  Administered 2024-03-17: 100 mL via INTRAVENOUS

## 2024-03-31 ENCOUNTER — Ambulatory Visit (HOSPITAL_COMMUNITY): Admission: RE | Admit: 2024-03-31 | Payer: MEDICAID | Source: Ambulatory Visit

## 2024-04-07 ENCOUNTER — Other Ambulatory Visit: Payer: Self-pay | Admitting: Physician Assistant

## 2024-04-07 ENCOUNTER — Ambulatory Visit (HOSPITAL_COMMUNITY): Admission: RE | Admit: 2024-04-07 | Payer: MEDICAID | Source: Ambulatory Visit

## 2024-04-07 DIAGNOSIS — K869 Disease of pancreas, unspecified: Secondary | ICD-10-CM

## 2024-04-21 ENCOUNTER — Ambulatory Visit (HOSPITAL_COMMUNITY)
Admission: RE | Admit: 2024-04-21 | Discharge: 2024-04-21 | Disposition: A | Discharge status: Home / Self Care | Payer: MEDICAID | Source: Ambulatory Visit | Attending: Physician Assistant | Admitting: Physician Assistant

## 2024-04-21 ENCOUNTER — Encounter (HOSPITAL_COMMUNITY): Payer: Self-pay

## 2024-04-21 DIAGNOSIS — K869 Disease of pancreas, unspecified: Secondary | ICD-10-CM

## 2024-06-17 ENCOUNTER — Encounter (HOSPITAL_COMMUNITY): Payer: Self-pay

## 2024-06-17 ENCOUNTER — Ambulatory Visit (HOSPITAL_COMMUNITY): Admission: RE | Admit: 2024-06-17 | Payer: MEDICAID | Source: Ambulatory Visit

## 2024-06-23 ENCOUNTER — Ambulatory Visit (HOSPITAL_COMMUNITY): Admission: RE | Admit: 2024-06-23 | Payer: MEDICAID | Source: Ambulatory Visit

## 2024-07-06 ENCOUNTER — Ambulatory Visit (HOSPITAL_COMMUNITY)
Admission: RE | Admit: 2024-07-06 | Discharge: 2024-07-06 | Disposition: A | Discharge status: Home / Self Care | Payer: MEDICAID | Source: Ambulatory Visit | Attending: Physician Assistant | Admitting: Physician Assistant

## 2024-07-06 DIAGNOSIS — K869 Disease of pancreas, unspecified: Secondary | ICD-10-CM | POA: Insufficient documentation

## 2024-07-06 MED ORDER — GADOBUTROL 1 MMOL/ML IV SOLN
7.5000 mL | Freq: Once | INTRAVENOUS | Status: AC | PRN
  Administered 2024-07-06: 7.5 mL via INTRAVENOUS

## 2024-07-12 ENCOUNTER — Ambulatory Visit: Payer: Self-pay | Admitting: Physician Assistant

## 2024-07-12 DIAGNOSIS — R933 Abnormal findings on diagnostic imaging of other parts of digestive tract: Secondary | ICD-10-CM

## 2024-07-12 DIAGNOSIS — K319 Disease of stomach and duodenum, unspecified: Secondary | ICD-10-CM

## 2024-08-24 ENCOUNTER — Ambulatory Visit (AMBULATORY_SURGERY_CENTER): Payer: MEDICAID | Admitting: Internal Medicine

## 2024-08-24 ENCOUNTER — Encounter: Payer: Self-pay | Admitting: Internal Medicine

## 2024-08-24 ENCOUNTER — Encounter: Payer: MEDICAID | Admitting: Gastroenterology

## 2024-08-24 VITALS — BP 150/96 | HR 94 | Temp 97.8°F | Resp 16 | Ht 70.5 in | Wt 166.0 lb

## 2024-08-24 DIAGNOSIS — K2970 Gastritis, unspecified, without bleeding: Secondary | ICD-10-CM | POA: Diagnosis not present

## 2024-08-24 DIAGNOSIS — K219 Gastro-esophageal reflux disease without esophagitis: Secondary | ICD-10-CM

## 2024-08-24 DIAGNOSIS — R110 Nausea: Secondary | ICD-10-CM

## 2024-08-24 DIAGNOSIS — K2980 Duodenitis without bleeding: Secondary | ICD-10-CM | POA: Diagnosis not present

## 2024-08-24 DIAGNOSIS — K449 Diaphragmatic hernia without obstruction or gangrene: Secondary | ICD-10-CM | POA: Diagnosis not present

## 2024-08-24 DIAGNOSIS — R1084 Generalized abdominal pain: Secondary | ICD-10-CM

## 2024-08-24 DIAGNOSIS — K2961 Other gastritis with bleeding: Secondary | ICD-10-CM | POA: Diagnosis not present

## 2024-08-24 MED ORDER — OMEPRAZOLE 40 MG PO CPDR: DELAYED_RELEASE_CAPSULE | ORAL | 0 refills | Status: AC

## 2024-08-24 MED ORDER — SODIUM CHLORIDE 0.9 % IV SOLN: 500.0000 mL | INTRAVENOUS | Status: AC

## 2024-08-24 NOTE — Progress Notes (Signed)
 Called to room to assist during endoscopic procedure.  Patient ID and intended procedure confirmed with present staff. Received instructions for my participation in the procedure from the performing physician.

## 2024-08-24 NOTE — Op Note (Signed)
 [facility] Patient Name: Keith Alvarado Procedure Date: 08/24/2024 11:49 AM MRN: [id number] Endoscopist: Rosario Estefana Kidney , , [id number] Age: 49 Referring MD:  Date of Birth: 21-Feb-1976 Gender: Male Account #: [id number] Procedure:                Upper GI endoscopy Indications:              Abnormal CT of the GI tract, Abnormal MRI of the GI                            tract Medicines:                Monitored Anesthesia Care Procedure:                Pre-Anesthesia Assessment:                           - Prior to the procedure, a History and Physical                            was performed, and patient medications and                            allergies were reviewed. The patient's tolerance of                            previous anesthesia was also reviewed. The risks                            and benefits of the procedure and the sedation                            options and risks were discussed with the patient.                            All questions were answered, and informed consent                            was obtained. Prior Anticoagulants: The patient has                            taken no anticoagulant or antiplatelet agents. ASA                            Grade Assessment: II - A patient with mild systemic                            disease. After reviewing the risks and benefits,                            the patient was deemed in satisfactory condition to                            undergo the procedure.  After obtaining informed consent, the endoscope was                            passed under direct vision. Throughout the                            procedure, the patient's blood pressure, pulse, and                            oxygen saturations were monitored continuously. The                            GIF [id number] #[id number] was introduced through the                            mouth, and advanced to the second part of  duodenum.                            The upper GI endoscopy was accomplished without                            difficulty. The patient tolerated the procedure                            well. Scope In: Scope Out: Findings:                 The examined esophagus was normal.                           A small hiatal hernia was present.                           Diffuse inflammation characterized by adherent                            blood, congestion (edema), erosions and erythema                            was found in the entire examined stomach. Biopsies                            were taken with a cold forceps for histology.                           Localized mild inflammation characterized by                            congestion (edema) and erythema was found in the                            duodenal bulb. Complications:            No immediate complications. Estimated Blood Loss:     Estimated blood loss was minimal. Impression:               -  Normal esophagus.                           - Small hiatal hernia.                           - Gastritis. Biopsied.                           - Duodenitis. Recommendation:           - Discharge patient to home (with escort).                           - Await pathology results.                           - Increase omeprazole  to 40 mg BID for 8 weeks,                            then decrease back to 40 mg QD.                           - Return to GI clinic in 2-3 months with Dr. Charlanne                            or APP.                           - The findings and recommendations were discussed                            with the patient. Dr Estefana Federico Rosario Estefana Federico,  08/24/2024 12:13:05 PM

## 2024-08-24 NOTE — Progress Notes (Signed)
 Vss nad trans to pacu

## 2024-08-24 NOTE — Progress Notes (Signed)
 "   GASTROENTEROLOGY PROCEDURE H&P NOTE   Primary Care Physician: Medicine, [facility]    Reason for Procedure:   Abnormal CT and MRI, thickening of the gastric lumen  Plan:    EGD  Patient is appropriate for endoscopic procedure(s) in the ambulatory ([facility]) setting.  The nature of the procedure, as well as the risks, benefits, and alternatives were carefully and thoroughly reviewed with the patient. Ample time for discussion and questions allowed. The patient understood, was satisfied, and agreed to proceed.     HPI: Keith Alvarado is a 49 y.o. male who presents for EGD to evaluate abnormal CT showing a hypodense thickening of the lesser curvature of the gastric lumen. 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  Past Medical History:  Diagnosis Date   GERD (gastroesophageal reflux disease)    Hypertension    Insomnia    Medical history non-contributory    Vitamin D  deficiency 05/17/2022    Past Surgical History:  Procedure Laterality Date   FOOT SURGERY Left    ORIF TIBIA PLATEAU Left 05/16/2022   Procedure: OPEN REDUCTION INTERNAL FIXATION (ORIF) TIBIAL PLATEAU;  Surgeon: Celena Sharper, MD;  Location: [facility] OR;  Service: Orthopedics;  Laterality: Left;   ORIF TIBIA PLATEAU Left 04/17/2023   Procedure: REMOVAL OF HARDWARE TIBIA PLATEAU;  Surgeon: Celena Sharper, MD;  Location: [facility] OR;  Service: Orthopedics;  Laterality: Left;   TOTAL HIP ARTHROPLASTY Right 07/17/2020   Procedure: RIGHT TOTAL HIP ARTHROPLASTY ANTERIOR APPROACH;  Surgeon: Liam Lerner, MD;  Location: [facility] ORS;  Service: Orthopedics;  Laterality: Right;   TOTAL HIP ARTHROPLASTY Left 12/11/2020   Procedure: LEFT TOTAL HIP ARTHROPLASTY ANTERIOR APPROACH;  Surgeon: Liam Lerner, MD;  Location: [facility] ORS;  Service: Orthopedics;  Laterality: Left;    Prior to Admission medications  Medication Sig Start Date  End Date Taking? Authorizing Provider  ascorbic acid  (VITAMIN C ) 1000 MG tablet Take 1 tablet (1,000 mg total) by mouth daily. 05/18/22   Deward Eck, PA-C  aspirin -sod bicarb-citric acid (ALKA-SELTZER) 325 MG TBEF tablet Take 650 mg by mouth every 6 (six) hours as needed (indigestion).    [provider]  bismuth subsalicylate (PEPTO BISMOL) 262 MG/15ML suspension Take 30 mLs by mouth every 6 (six) hours as needed for indigestion or diarrhea or loose stools.    [provider]  calcium  carbonate (TUMS - DOSED IN MG ELEMENTAL CALCIUM ) 500 MG chewable tablet Chew 2 tablets by mouth as needed for indigestion or heartburn.    [provider]  Cholecalciferol  125 MCG (5000 UT) TABS Take 1 tablet by mouth daily. 05/17/22   Deward Eck, PA-C  hydrOXYzine (ATARAX) 50 MG tablet Take 50 mg by mouth 3 (three) times daily.    [provider]  hyoscyamine  (LEVSIN ) 0.125 MG tablet Take 1 tablet (0.125 mg total) by mouth every 6 (six) hours as needed for cramping. 03/08/24   Craig Alan SAUNDERS, PA-C  ketorolac  (TORADOL ) 10 MG tablet Take 1 tablet (10 mg total) by mouth every 6 (six) hours as needed. 04/17/23   Deward Eck, PA-C  lisinopril (ZESTRIL) 10 MG tablet Take 10 mg by mouth daily. 12/01/23   [provider]  mirtazapine (REMERON) 30 MG tablet Take 30 mg by mouth at bedtime. 02/27/24   [provider]  omeprazole  (PRILOSEC) 40 MG capsule Take 1 capsule (40 mg total) by mouth daily. 03/08/24  Craig Alan SAUNDERS, PA-C    Current Outpatient Medications  Medication Sig Dispense Refill   ascorbic acid  (VITAMIN C ) 1000 MG tablet Take 1 tablet (1,000 mg total) by mouth daily. 30 tablet 1   aspirin -sod bicarb-citric acid (ALKA-SELTZER) 325 MG TBEF tablet Take 650 mg by mouth every 6 (six) hours as needed (indigestion).     bismuth subsalicylate (PEPTO BISMOL) 262 MG/15ML suspension Take 30 mLs by mouth every 6 (six) hours as needed for indigestion or diarrhea or loose  stools.     hydrOXYzine (ATARAX) 50 MG tablet Take 50 mg by mouth 3 (three) times daily.     mirtazapine (REMERON) 30 MG tablet Take 30 mg by mouth at bedtime.     omeprazole  (PRILOSEC) 40 MG capsule Take 1 capsule (40 mg total) by mouth daily. 90 capsule 0   calcium  carbonate (TUMS - DOSED IN MG ELEMENTAL CALCIUM ) 500 MG chewable tablet Chew 2 tablets by mouth as needed for indigestion or heartburn.     Cholecalciferol  125 MCG (5000 UT) TABS Take 1 tablet by mouth daily. 30 tablet 6   hyoscyamine  (LEVSIN ) 0.125 MG tablet Take 1 tablet (0.125 mg total) by mouth every 6 (six) hours as needed for cramping. 30 tablet 0   ketorolac  (TORADOL ) 10 MG tablet Take 1 tablet (10 mg total) by mouth every 6 (six) hours as needed. 20 tablet 0   lisinopril (ZESTRIL) 10 MG tablet Take 10 mg by mouth daily.     Current Facility-Administered Medications  Medication Dose Route Frequency Provider Last Rate Last Admin   0.9 %  sodium chloride  infusion  500 mL Intravenous Continuous Federico Rosario BROCKS, MD        Allergies as of 08/24/2024   (No Known Allergies)    Family History  Problem Relation Age of Onset   Healthy Mother    Healthy Father    Colon cancer Neg Hx    Esophageal cancer Neg Hx    Rectal cancer Neg Hx    Stomach cancer Neg Hx     Social History   Socioeconomic History   Marital status: Single    Spouse name: Not on file   Number of children: 1   Years of education: Not on file   Highest education level: Not on file  Occupational History   Not on file  Tobacco Use   Smoking status: Never   Smokeless tobacco: Never  Vaping Use   Vaping status: Never Used  Substance and Sexual Activity   Alcohol use: Yes    Alcohol/week: 6.0 - 8.0 standard drinks of alcohol    Types: 6 - 8 Standard drinks or equivalent per week    Comment: whiskey   Drug use: Yes    Types: Marijuana    Comment: patient states earlier today 04-16-23   Sexual activity: Yes  Other Topics Concern   Not on file   Social History Narrative   Not on file   Social Drivers of Health   Tobacco Use: Low Risk (08/24/2024)   Patient History    Smoking Tobacco Use: Never    Smokeless Tobacco Use: Never    Passive Exposure: Not on file  Financial Resource Strain: Low Risk (10/10/2023)   Received from [facility]   Overall Financial Resource Strain (CARDIA)    Difficulty of Paying Living Expenses: Not very hard  Food Insecurity: No Food Insecurity (10/10/2023)   Received from [facility]   Epic    Within the past 12 months,  you worried that your food would run out before you got the money to buy more.: Never true    Within the past 12 months, the food you bought just didn't last and you didn't have money to get more.: Never true  Transportation Needs: No Transportation Needs (10/10/2023)   Received from [facility]   PRAPARE - Transportation    Lack of Transportation (Medical): No    Lack of Transportation (Non-Medical): No  Physical Activity: Insufficiently Active (12/05/2022)   Received from [facility]   Exercise Vital Sign    On average, how many days per week do you engage in moderate to strenuous exercise (like a brisk walk)?: 2 days    On average, how many minutes do you engage in exercise at this level?: 30 min  Stress: No Stress Concern Present (12/05/2022)   Received from [facility] - Occupational Stress Questionnaire    Feeling of Stress : Not at all  Social Connections: Socially Integrated (12/05/2022)   Received from [facility]   Social Network    How would you rate your social network (family, work, friends)?: Good participation with social networks  Intimate Partner Violence: Not At Risk (12/05/2022)   Received from [facility]   HITS    Over the last 12 months how often did your partner physically hurt you?: Never    Over the last 12 months how often did your partner insult you or talk down to you?: Never    Over the last 12  months how often did your partner threaten you with physical harm?: Never    Over the last 12 months how often did your partner scream or curse at you?: Rarely  Depression (PHQ2-9): Not on file  Alcohol Screen: Not on file  Housing: Low Risk (10/10/2023)   Received from [facility]    In the last 12 months, was there a time when you were not able to pay the mortgage or rent on time?: No    In the past 12 months, how many times have you moved where you were living?: 1    At any time in the past 12 months, were you homeless or living in a shelter (including now)?: No  Utilities: Not At Risk (10/10/2023)   Received from [facility] Utilities    Threatened with loss of utilities: No  Health Literacy: Not on file    Physical Exam: Vital signs in last 24 hours: BP 133/73   Pulse 88   Temp 97.8 F (36.6 C) (Temporal)   Ht 5' 10.5 (1.791 m)   Wt 166 lb (75.3 kg)   SpO2 100%   BMI 23.48 kg/m  GEN: NAD EYE: Sclerae anicteric ENT: MMM CV: Non-tachycardic Pulm: No increased work of breathing GI: Soft, NT/ND NEURO:  Alert & Oriented   Estefana Kidney, MD [facility]  08/24/2024 11:09 AM  "

## 2024-08-24 NOTE — Patient Instructions (Signed)
-   Discharge patient to home (with escort). - Await pathology results. - Increase omeprazole  to 40 mg BID for 8 weeks, then decrease back to 40 mg QD. - Return to GI clinic in 2-3 months with Dr. Charlanne or APP.  YOU HAD AN ENDOSCOPIC PROCEDURE TODAY AT THE [facility]:   Refer to the procedure report that was given to you for any specific questions about what was found during the examination.  If the procedure report does not answer your questions, please call your gastroenterologist to clarify.  If you requested that your care partner not be given the details of your procedure findings, then the procedure report has been included in a sealed envelope for you to review at your convenience later.  YOU SHOULD EXPECT: Some feelings of bloating in the abdomen. Passage of more gas than usual.  Walking can help get rid of the air that was put into your GI tract during the procedure and reduce the bloating. If you had a lower endoscopy (such as a colonoscopy or flexible sigmoidoscopy) you may notice spotting of blood in your stool or on the toilet paper. If you underwent a bowel prep for your procedure, you may not have a normal bowel movement for a few days.  Please Note:  You might notice some irritation and congestion in your nose or some drainage.  This is from the oxygen used during your procedure.  There is no need for concern and it should clear up in a day or so.  SYMPTOMS TO REPORT IMMEDIATELY: Following upper endoscopy (EGD)  Vomiting of blood or coffee ground material  New chest pain or pain under the shoulder blades  Painful or persistently difficult swallowing  New shortness of breath  Fever of 100F or higher  Black, tarry-looking stools  For urgent or emergent issues, a gastroenterologist can be reached at any hour by calling [phone]. Do not use MyChart messaging for urgent concerns.    DIET:  We do recommend a small meal at first, but then you may proceed to your  regular diet.  Drink plenty of fluids but you should avoid alcoholic beverages for 24 hours.  ACTIVITY:  You should plan to take it easy for the rest of today and you should NOT DRIVE or use heavy machinery until tomorrow (because of the sedation medicines used during the test).    FOLLOW UP: Our staff will call the number listed on your records the next business day following your procedure.  We will call around 7:15- 8:00 am to check on you and address any questions or concerns that you may have regarding the information given to you following your procedure. If we do not reach you, we will leave a message.     If any biopsies were taken you will be contacted by phone or by letter within the next 1-3 weeks.  Please call us  at [phone] if you have not heard about the biopsies in 3 weeks.    SIGNATURES/CONFIDENTIALITY: You and/or your care partner have signed paperwork which will be entered into your electronic medical record.  These signatures attest to the fact that that the information above on your After Visit Summary has been reviewed and is understood.  Full responsibility of the confidentiality of this discharge information lies with you and/or your care-partner.

## 2024-08-25 ENCOUNTER — Telehealth: Payer: Self-pay

## 2024-08-25 NOTE — Telephone Encounter (Signed)
" °  Follow up Call-     08/24/2024   10:42 AM  Call back number  Post procedure Call Back phone  # [phone]  Permission to leave phone message Yes     Patient questions:  Do you have a fever, pain , or abdominal swelling? No. Pain Score  0 *  Have you tolerated food without any problems? Yes.    Have you been able to return to your normal activities? Yes.    Do you have any questions about your discharge instructions: Diet   No. Medications  No. Follow up visit  No.  Do you have questions or concerns about your Care? No.  Actions: * If pain score is 4 or above: No action needed, pain <4.   "

## 2024-08-26 ENCOUNTER — Ambulatory Visit: Payer: Self-pay | Admitting: Internal Medicine

## 2024-08-26 LAB — SURGICAL PATHOLOGY

## 2024-10-25 ENCOUNTER — Ambulatory Visit: Payer: MEDICAID | Admitting: Gastroenterology
